# Patient Record
Sex: Female | Born: 1935 | Race: White | Hispanic: No | Smoking: Never smoker
Health system: Southern US, Community
[De-identification: ages and names within clinical notes are randomized; demographics above are authoritative.]

## PROBLEM LIST (undated history)

## (undated) DIAGNOSIS — N183 Chronic kidney disease, stage 3 (moderate): Secondary | ICD-10-CM

## (undated) DIAGNOSIS — E785 Hyperlipidemia, unspecified: Secondary | ICD-10-CM

## (undated) DIAGNOSIS — J309 Allergic rhinitis, unspecified: Secondary | ICD-10-CM

## (undated) DIAGNOSIS — M8580 Other specified disorders of bone density and structure, unspecified site: Secondary | ICD-10-CM

## (undated) DIAGNOSIS — K5790 Diverticulosis of intestine, part unspecified, without perforation or abscess without bleeding: Secondary | ICD-10-CM

## (undated) DIAGNOSIS — N189 Chronic kidney disease, unspecified: Secondary | ICD-10-CM

## (undated) DIAGNOSIS — C801 Malignant (primary) neoplasm, unspecified: Secondary | ICD-10-CM

## (undated) DIAGNOSIS — K449 Diaphragmatic hernia without obstruction or gangrene: Secondary | ICD-10-CM

## (undated) DIAGNOSIS — C189 Malignant neoplasm of colon, unspecified: Secondary | ICD-10-CM

## (undated) DIAGNOSIS — K5900 Constipation, unspecified: Secondary | ICD-10-CM

## (undated) DIAGNOSIS — R946 Abnormal results of thyroid function studies: Secondary | ICD-10-CM

## (undated) DIAGNOSIS — C439 Malignant melanoma of skin, unspecified: Secondary | ICD-10-CM

## (undated) DIAGNOSIS — I129 Hypertensive chronic kidney disease with stage 1 through stage 4 chronic kidney disease, or unspecified chronic kidney disease: Secondary | ICD-10-CM

## (undated) DIAGNOSIS — I447 Left bundle-branch block, unspecified: Secondary | ICD-10-CM

## (undated) DIAGNOSIS — K8020 Calculus of gallbladder without cholecystitis without obstruction: Secondary | ICD-10-CM

## (undated) DIAGNOSIS — J449 Chronic obstructive pulmonary disease, unspecified: Secondary | ICD-10-CM

## (undated) DIAGNOSIS — J45909 Unspecified asthma, uncomplicated: Secondary | ICD-10-CM

## (undated) HISTORY — DX: Malignant melanoma of skin, unspecified: C43.9

## (undated) HISTORY — PX: ABDOMINAL HYSTERECTOMY: SUR658

## (undated) HISTORY — DX: Malignant neoplasm of colon, unspecified: C18.9

## (undated) HISTORY — DX: Hypertensive chronic kidney disease with stage 1 through stage 4 chronic kidney disease, or unspecified chronic kidney disease: I12.9

## (undated) HISTORY — DX: Abnormal results of thyroid function studies: R94.6

## (undated) HISTORY — PX: OTHER SURGICAL HISTORY: SHX169

## (undated) HISTORY — DX: Hyperlipidemia, unspecified: E78.5

## (undated) HISTORY — DX: Chronic obstructive pulmonary disease, unspecified: J44.9

## (undated) HISTORY — DX: Constipation, unspecified: K59.00

## (undated) HISTORY — DX: Left bundle-branch block, unspecified: I44.7

## (undated) HISTORY — DX: Chronic kidney disease, stage 3 (moderate): N18.3

## (undated) HISTORY — PX: BREAST EXCISIONAL BIOPSY: SUR124

## (undated) HISTORY — PX: COLON SURGERY: SHX602

## (undated) HISTORY — DX: Diverticulosis of intestine, part unspecified, without perforation or abscess without bleeding: K57.90

## (undated) HISTORY — DX: Calculus of gallbladder without cholecystitis without obstruction: K80.20

## (undated) HISTORY — DX: Chronic kidney disease, unspecified: N18.9

## (undated) HISTORY — PX: HEMICOLECTOMY: SHX854

## (undated) HISTORY — DX: Allergic rhinitis, unspecified: J30.9

## (undated) HISTORY — DX: Unspecified asthma, uncomplicated: J45.909

## (undated) HISTORY — DX: Other specified disorders of bone density and structure, unspecified site: M85.80

## (undated) HISTORY — DX: Diaphragmatic hernia without obstruction or gangrene: K44.9

---

## 2004-03-02 HISTORY — PX: SKIN CANCER EXCISION: SHX779

## 2004-03-02 HISTORY — PX: COLON RESECTION: SHX5231

## 2008-09-11 ENCOUNTER — Ambulatory Visit: Payer: Self-pay | Admitting: Family Medicine

## 2009-01-22 ENCOUNTER — Ambulatory Visit: Payer: Self-pay | Admitting: Internal Medicine

## 2009-02-05 ENCOUNTER — Ambulatory Visit: Payer: Self-pay | Admitting: Internal Medicine

## 2010-02-13 ENCOUNTER — Ambulatory Visit: Payer: Self-pay | Admitting: Internal Medicine

## 2011-04-02 ENCOUNTER — Emergency Department: Payer: Self-pay | Admitting: *Deleted

## 2011-04-02 LAB — CBC
HCT: 37.2 % (ref 35.0–47.0)
HGB: 12.4 g/dL (ref 12.0–16.0)
MCH: 28.8 pg (ref 26.0–34.0)
MCHC: 33.3 g/dL (ref 32.0–36.0)
Platelet: 335 10*3/uL (ref 150–440)
RBC: 4.3 10*6/uL (ref 3.80–5.20)
WBC: 13.3 10*3/uL — ABNORMAL HIGH (ref 3.6–11.0)

## 2011-04-02 LAB — URINALYSIS, COMPLETE
Bilirubin,UR: NEGATIVE
Blood: NEGATIVE
Ketone: NEGATIVE
Nitrite: NEGATIVE
Ph: 5 (ref 4.5–8.0)
Protein: NEGATIVE
RBC,UR: 1 /HPF (ref 0–5)

## 2011-04-02 LAB — COMPREHENSIVE METABOLIC PANEL
Anion Gap: 12 (ref 7–16)
Bilirubin,Total: 0.5 mg/dL (ref 0.2–1.0)
Calcium, Total: 8.8 mg/dL (ref 8.5–10.1)
Chloride: 99 mmol/L (ref 98–107)
EGFR (African American): >60
EGFR (Non-African Amer.): 53 — ABNORMAL LOW
Glucose: 128 mg/dL — ABNORMAL HIGH (ref 65–99)
Osmolality: 280 (ref 275–301)
Potassium: 3.7 mmol/L (ref 3.5–5.1)
SGOT(AST): 30 U/L (ref 15–37)
Total Protein: 7.3 g/dL (ref 6.4–8.2)

## 2011-04-02 LAB — TROPONIN I: Troponin-I: <0.02 ng/mL

## 2011-04-22 ENCOUNTER — Ambulatory Visit: Payer: Self-pay | Admitting: Internal Medicine

## 2011-09-19 ENCOUNTER — Inpatient Hospital Stay: Payer: Self-pay | Admitting: Internal Medicine

## 2011-09-19 LAB — COMPREHENSIVE METABOLIC PANEL
Albumin: 3.7 g/dL (ref 3.4–5.0)
Anion Gap: 10 (ref 7–16)
BUN: 24 mg/dL — ABNORMAL HIGH (ref 7–18)
Bilirubin,Total: 0.5 mg/dL (ref 0.2–1.0)
Chloride: 102 mmol/L (ref 98–107)
Co2: 24 mmol/L (ref 21–32)
Creatinine: 1.13 mg/dL (ref 0.60–1.30)
EGFR (Non-African Amer.): 44 — ABNORMAL LOW
Osmolality: 278 (ref 275–301)
SGOT(AST): 31 U/L (ref 15–37)
Sodium: 136 mmol/L (ref 136–145)
Total Protein: 7.5 g/dL (ref 6.4–8.2)

## 2011-09-19 LAB — CBC
HCT: 41.9 % (ref 35.0–47.0)
HGB: 13.1 g/dL (ref 12.0–16.0)
MCV: 87 fL (ref 80–100)
Platelet: 313 10*3/uL (ref 150–440)
RBC: 4.83 10*6/uL (ref 3.80–5.20)
RDW: 15 % — ABNORMAL HIGH (ref 11.5–14.5)
WBC: 13.1 10*3/uL — ABNORMAL HIGH (ref 3.6–11.0)

## 2011-09-19 LAB — URINALYSIS, COMPLETE
Glucose,UR: NEGATIVE mg/dL (ref 0–75)
Protein: NEGATIVE
RBC,UR: 61 /HPF (ref 0–5)
Specific Gravity: 1.021 (ref 1.003–1.030)
Squamous Epithelial: NONE SEEN
WBC UR: 3 /HPF (ref 0–5)

## 2011-09-19 LAB — MAGNESIUM: Magnesium: 2 mg/dL

## 2011-09-19 LAB — LIPASE, BLOOD: Lipase: 116 U/L (ref 73–393)

## 2011-09-20 LAB — BASIC METABOLIC PANEL
BUN: 26 mg/dL — ABNORMAL HIGH (ref 7–18)
Calcium, Total: 7.7 mg/dL — ABNORMAL LOW (ref 8.5–10.1)
Chloride: 104 mmol/L (ref 98–107)
Creatinine: 0.98 mg/dL (ref 0.60–1.30)
EGFR (Non-African Amer.): 52 — ABNORMAL LOW
Potassium: 3.4 mmol/L — ABNORMAL LOW (ref 3.5–5.1)
Sodium: 140 mmol/L (ref 136–145)

## 2011-09-20 LAB — CBC WITH DIFFERENTIAL/PLATELET
Basophil %: 0.2 %
Eosinophil #: 0 10*3/uL (ref 0.0–0.7)
Eosinophil %: 0.2 %
Lymphocyte #: 1.1 10*3/uL (ref 1.0–3.6)
Lymphocyte %: 9 %
MCH: 28.4 pg (ref 26.0–34.0)
MCV: 86 fL (ref 80–100)
Monocyte #: 1 x10 3/mm — ABNORMAL HIGH (ref 0.2–0.9)
Neutrophil %: 82.8 %
Platelet: 285 10*3/uL (ref 150–440)
RBC: 4.02 10*6/uL (ref 3.80–5.20)

## 2011-09-21 LAB — CBC WITH DIFFERENTIAL/PLATELET
Basophil %: 0.7 %
Eosinophil #: 0.2 10*3/uL (ref 0.0–0.7)
Eosinophil %: 3.1 %
HCT: 34.1 % — ABNORMAL LOW (ref 35.0–47.0)
Lymphocyte %: 14.2 %
MCHC: 31.8 g/dL — ABNORMAL LOW (ref 32.0–36.0)
Monocyte %: 8.6 %
Neutrophil #: 5.7 10*3/uL (ref 1.4–6.5)
Neutrophil %: 73.4 %
RBC: 3.9 10*6/uL (ref 3.80–5.20)
RDW: 15 % — ABNORMAL HIGH (ref 11.5–14.5)
WBC: 7.8 10*3/uL (ref 3.6–11.0)

## 2011-09-21 LAB — COMPREHENSIVE METABOLIC PANEL
Albumin: 2.6 g/dL — ABNORMAL LOW (ref 3.4–5.0)
Alkaline Phosphatase: 57 U/L (ref 50–136)
Anion Gap: 8 (ref 7–16)
BUN: 21 mg/dL — ABNORMAL HIGH (ref 7–18)
Bilirubin,Total: 0.4 mg/dL (ref 0.2–1.0)
Calcium, Total: 7.2 mg/dL — ABNORMAL LOW (ref 8.5–10.1)
Creatinine: 1.14 mg/dL (ref 0.60–1.30)
Potassium: 3 mmol/L — ABNORMAL LOW (ref 3.5–5.1)
SGOT(AST): 22 U/L (ref 15–37)
Total Protein: 5.2 g/dL — ABNORMAL LOW (ref 6.4–8.2)

## 2011-09-21 LAB — URINE CULTURE

## 2011-09-22 LAB — CBC WITH DIFFERENTIAL/PLATELET
Basophil #: 0 10*3/uL (ref 0.0–0.1)
Basophil %: 0.6 %
Eosinophil #: 0.5 10*3/uL (ref 0.0–0.7)
HCT: 31.1 % — ABNORMAL LOW (ref 35.0–47.0)
HGB: 10.3 g/dL — ABNORMAL LOW (ref 12.0–16.0)
Lymphocyte #: 1.1 10*3/uL (ref 1.0–3.6)
MCH: 28.7 pg (ref 26.0–34.0)
MCHC: 32.9 g/dL (ref 32.0–36.0)
MCV: 87 fL (ref 80–100)
Monocyte #: 0.6 x10 3/mm (ref 0.2–0.9)
Neutrophil #: 4.3 10*3/uL (ref 1.4–6.5)
Platelet: 223 10*3/uL (ref 150–440)
RDW: 14.9 % — ABNORMAL HIGH (ref 11.5–14.5)

## 2011-09-22 LAB — BASIC METABOLIC PANEL
Anion Gap: 8 (ref 7–16)
Calcium, Total: 7.2 mg/dL — ABNORMAL LOW (ref 8.5–10.1)
Co2: 23 mmol/L (ref 21–32)
EGFR (African American): >60
EGFR (Non-African Amer.): 55 — ABNORMAL LOW
Glucose: 98 mg/dL (ref 65–99)
Osmolality: 285 (ref 275–301)
Potassium: 3.4 mmol/L — ABNORMAL LOW (ref 3.5–5.1)

## 2012-02-06 ENCOUNTER — Emergency Department: Payer: Self-pay | Admitting: Emergency Medicine

## 2012-04-25 ENCOUNTER — Ambulatory Visit: Payer: Self-pay | Admitting: Internal Medicine

## 2012-05-04 ENCOUNTER — Ambulatory Visit: Payer: Self-pay | Admitting: Internal Medicine

## 2013-05-02 ENCOUNTER — Ambulatory Visit: Payer: Self-pay | Admitting: Internal Medicine

## 2013-11-18 DIAGNOSIS — N183 Chronic kidney disease, stage 3 unspecified: Secondary | ICD-10-CM

## 2013-11-18 DIAGNOSIS — E785 Hyperlipidemia, unspecified: Secondary | ICD-10-CM

## 2013-11-18 DIAGNOSIS — J4489 Other specified chronic obstructive pulmonary disease: Secondary | ICD-10-CM

## 2013-11-18 DIAGNOSIS — J309 Allergic rhinitis, unspecified: Secondary | ICD-10-CM

## 2013-11-18 DIAGNOSIS — E039 Hypothyroidism, unspecified: Secondary | ICD-10-CM

## 2013-11-18 DIAGNOSIS — I447 Left bundle-branch block, unspecified: Secondary | ICD-10-CM | POA: Insufficient documentation

## 2013-11-18 DIAGNOSIS — K5900 Constipation, unspecified: Secondary | ICD-10-CM

## 2013-11-18 DIAGNOSIS — J449 Chronic obstructive pulmonary disease, unspecified: Secondary | ICD-10-CM

## 2013-11-18 DIAGNOSIS — R946 Abnormal results of thyroid function studies: Secondary | ICD-10-CM

## 2013-11-18 DIAGNOSIS — I129 Hypertensive chronic kidney disease with stage 1 through stage 4 chronic kidney disease, or unspecified chronic kidney disease: Secondary | ICD-10-CM

## 2013-11-18 HISTORY — DX: Other specified chronic obstructive pulmonary disease: J44.89

## 2013-11-18 HISTORY — DX: Hyperlipidemia, unspecified: E78.5

## 2013-11-18 HISTORY — DX: Allergic rhinitis, unspecified: J30.9

## 2013-11-18 HISTORY — DX: Hypertensive chronic kidney disease with stage 1 through stage 4 chronic kidney disease, or unspecified chronic kidney disease: I12.9

## 2013-11-18 HISTORY — DX: Constipation, unspecified: K59.00

## 2013-11-18 HISTORY — DX: Hypothyroidism, unspecified: E03.9

## 2013-11-18 HISTORY — DX: Chronic obstructive pulmonary disease, unspecified: J44.9

## 2013-11-18 HISTORY — DX: Left bundle-branch block, unspecified: I44.7

## 2013-11-18 HISTORY — DX: Chronic kidney disease, stage 3 unspecified: N18.30

## 2014-04-17 ENCOUNTER — Ambulatory Visit: Payer: Self-pay | Admitting: Orthopedic Surgery

## 2014-05-07 ENCOUNTER — Ambulatory Visit: Payer: Self-pay | Admitting: Internal Medicine

## 2014-06-19 NOTE — H&P (Signed)
PATIENT NAME:  Beth Chen, KUHLMANN MR#:  [id number] DATE OF BIRTH:  11/23/1921  DATE OF ADMISSION:  09/19/2011  PRIMARY CARE PHYSICIAN: Frazier Richards, MD  ED REFERRING PHYSICIAN:  Belva Bertin, MD   CHIEF COMPLAINT: Abdominal pain.   HISTORY OF PRESENT ILLNESS: The patient is an 81 year old Caucasian female with history of colon cancer, hypertension, hyperlipidemia, and history of severe colonic diverticulosis who states that she has occasional abdominal discomfort in the right lower abdomen. However, over the past few days she has had progressive pain in the right lower abdomen with associated nausea. She also threw up once. Due to her pain getting worse, she came to the ED. In the ED, she had a CT scan of the abdomen which showed distended loops of small bowel as well as there was a hernia noted on the lateral aspect of the abdomen. We were asked to admit the patient. She otherwise reports that she was eating well up until yesterday when her appetite started decreasing. She has also been feeling very nauseous and has threw up once. Her last bowel movement was in a small quantity yesterday. No bowel movement since then. She has not had any hematemesis, has not had any hematochezia. She otherwise denies any chest pain, no shortness of breath. She denies any urinary frequency, urgency, or hesitancy. The patient complains of abdominal pain in the right lower quadrant which she describes as sharp, constant type of pain.   PAST MEDICAL HISTORY:  1. History of melanoma.  2. History of hypercholesterolemia.  3. Hypertension.  4. Chronic bronchitis/chronic obstructive pulmonary disease.  5. Osteopenia.  6. History of cholelithiasis.  7. Right hiatal hernia.  8. Severe colonic diverticulosis.  9. History of colon cancer in a large polyp in 2008 with several polypectomies in 2009. 10. History of adenocarcinoma in October 2006 arising from ascending colon requiring right hemicolectomy. Negative lymph nodes. Did  not require chemotherapy or radiation therapy.   PAST SURGICAL HISTORY:  1. Status post bladder lift. 2. Status post hysterectomy.  3. Status post right hemicolectomy in 2006.   CURRENT MEDICATIONS:  1. Advair 250/50 one puff twice a day. 2. Aspirin 81 mg 1 tab p.o. daily.  3. Dulcolax 1 tab as needed.  4. Lisinopril/HCTZ 10/25 mg daily.  5. Prilosec 20 mg daily.  6. Simvastatin 40 mg at bedtime.  7. Tylenol extra strength 2 tabs as needed for pain.   ALLERGIES: No known drug allergies.   SOCIAL HISTORY: She does not smoke, does not drink. Currently resides at [facility].   FAMILY HISTORY: Mother had congestive heart failure, father with lung cancer.   REVIEW OF SYSTEMS: CONSTITUTIONAL: Denies any fevers. Complains of fatigue and weakness for the past two days. Complains of right lower quadrant abdominal pain. No weight loss. No weight gain. EYES: No blurred or double vision. No pain. No redness. No inflammation. ENT: No tinnitus. No ear pain. No difficulty swallowing. RESPIRATORY: Has chronic bronchitis. Denies any wheezing or hemoptysis. CARDIOVASCULAR: No chest pain. No orthopnea. No edema. No palpitations. No syncope. GASTROINTESTINAL: Complains of nausea and vomiting. No diarrhea. No hematemesis. No melena. No rectal bleeding. GENITOURINARY: Denies any dysuria, hematuria, or renal calculus. ENDOCRINE: Denies any polyuria, nocturia, or thyroid problems. HEME/LYMPH: Denies anemia, easy bruisability, or bleeding. SKIN: No acne. No rash. No changes in mole, hair or skin. MUSCULOSKELETAL: Denies any pain in the neck, back, or shoulders. NEURO: No numbness. No cerebrovascular accident. No transient ischemic attack. PSYCHIATRIC: No anxiety. No insomnia. No ADD.  PHYSICAL EXAMINATION:   VITAL SIGNS: Temperature 98.3, pulse 71, respirations 18, blood pressure 125/60, and O2 95%.   GENERAL: The patient is an 81 year old female in no acute distress.   HEENT: Head atraumatic,  normocephalic. Pupils equally round and reactive to light and accommodation. There is no conjunctival pallor. No scleral icterus. Nasal exam shows no drainage or ulceration. Oropharynx is clear without any exudates.   NECK: No thyromegaly. No carotid bruits.   CARDIOVASCULAR: Regular rate and rhythm. No murmurs, rubs, clicks, or gallops. PMI is displaced.   LUNGS: Clear to auscultation bilaterally without any rales, rhonchi, or wheezing.   ABDOMEN: There is some mild distention. She has a hernia in the right lower quadrant with loops of bowel felt in the hernia. She does not have any significant guarding. She does have some mild tenderness. No hepatosplenomegaly.   EXTREMITIES: No clubbing, cyanosis, or edema.   SKIN: No rash.   LYMPHATICS: No lymph nodes palpable.   VASCULAR: Good DP and PT pulses.   PSYCHIATRIC: Not anxious or depressed.   NEUROLOGICAL: Awake, alert, and oriented x3. No focal deficits.  LABORATORY AND RADIOLOGIC DATA: CT of the abdomen shows distended loops of small bowel are present along with nondistended loops of small bowel. There is a hernia along the lateral aspect of the abdomen just above the iliac crest. There is cholelithiasis and old healed rib fractures.   Urinalysis shows leukocyte esterase, WBC is 3, and 2+ bacteria.  BMP: Glucose is 139, BUN 24, creatinine 1.13, sodium 136, potassium 3.8, chloride 102, and CO2 24. LFTs were normal. WBC 13.1, hemoglobin 13.1, and platelet count 413. Lipase was 116.   ASSESSMENT AND PLAN: The patient is an 81 year old white female who presents with abdominal pain.  1. Abdominal pain likely related to possible ileus/small bowel obstruction related to abdominal hernia. At this point, we will keep the patient n.p.o. Control her pain. IV fluids. We will have surgery come and evaluate the patient in the morning. Hold off on NG tube for the time being.  2. Hypertension. Blood pressure is borderline normal. At this point, we  will hold her antihypertensives.  3. Hyperlipidemia. Hold her simvastatin for the time being.  4. Chronic obstructive pulmonary disease/chronic bronchitis. We will continue inhalers as taking at home.  5. Miscellaneous. We will place her on heparin for deep vein thrombosis prophylaxis.   TIME SPENT: 35 minutes.  ____________________________ Lafonda Mosses Posey Pronto, MD shp:slb D: 09/19/2011 23:00:36 ET T: 09/20/2011 09:48:03 ET JOB#: [id number]  cc: Johnnell Liou H. Posey Pronto, MD, <Dictator> Ocie Cornfield. Ouida Sills, MD Alric Seton MD ELECTRONICALLY SIGNED 09/21/2011 19:56

## 2014-06-19 NOTE — Discharge Summary (Signed)
PATIENT NAME:  Beth Chen, Beth Chen MR#:  [id number] DATE OF BIRTH:  1921-03-15  DATE OF ADMISSION:  09/19/2011 DATE OF DISCHARGE:  09/22/2011  DISCHARGE DIAGNOSES:  1. Diverticulitis. 2. Ileus.   DISCHARGE MEDICATIONS:  1. Cipro 500 b.i.d. for a week, new medication. 2. Flagyl 500 t.i.d. for a week, new medication. Home medications will all be continued:  3. Simvastatin 40 mg daily.  4. Aspirin 81 mg daily.  5. Tylenol p.r.n.  6. Advair 250/50, 1 puff b.i.d.  7. Prilosec 20 daily.  8. Hydrochlorothiazide/lisinopril 10/12.5 daily.   HISTORY AND PHYSICAL: Please see detailed History and Physical done on admission.   HOSPITAL COURSE: The patient was admitted with abdominal pain, likely diverticulitis. There was no small bowel obstruction per surgery consult and imaging studies. Also had a urinary tract infection of Escherichia coli that was pansensitive. CT of the abdomen and pelvis done showed distended loops of small bowel and nondistended loops of small bowel with a hernia in the lateral aspect of the abdomen. Also noted to have cholelithiasis. Surgery consulted and followed her closely.  They thought she became much less distended in her abdomen. With antibiotics her white count, which was elevated on admission, improved to 7.8. She had a mild anemia, which may have been secondary to her mild chronic kidney disease stage III that was being treated as well.  She was followed by surgery to consider ventral hernia repair. I will see her soon in the office as well.  She will tolerate her diet and ambulate prior to discharge.  ____________________________ Ocie Cornfield. Ouida Sills, MD mwa:bjt D:  09/22/2011 08:10:11 ET         T: 09/22/2011 12:56:45 ET         JOB#: [id number] Kirk Ruths MD ELECTRONICALLY SIGNED 09/22/2011 17:18

## 2014-06-19 NOTE — Consult Note (Signed)
PATIENT NAME:  Beth Chen, NULTY MR#:  [id number] DATE OF BIRTH:  May 04, 1921  DATE OF CONSULTATION:  09/20/2011  REFERRING PHYSICIAN:   CONSULTING PHYSICIAN:  Jerrol Banana. Burt Knack, MD  CHIEF COMPLAINT: Abdominal pain.   HISTORY OF PRESENT ILLNESS: This is a patient with epigastric pain that started two days ago and seemed to be worsening, but at this point in the hospitalization she seems to be much better. She states her pain is present but much improved. She has had some nausea today but no emesis today. She had vomited yesterday but no NG tube was placed in the emergency room. I was asked to see the patient for possible small bowel obstruction and ventral hernia. The patient states she has known about her ventral hernia for a long time and has been told in the past not to have it repaired. She has been passing gas today but has not had a bowel movement in several days.   She has never had an episode like this before, denies fevers or chills.   PAST MEDICAL HISTORY:  1. Colon cancer. 2. Hypertension.  3. Hypercholesterolemia.  4. Chronic bronchitis and chronic obstructive pulmonary disease.  5. Diverticulosis.   PAST SURGICAL HISTORY:  1. Right hemicolectomy performed in [place], [place]. 2. Hysterectomy and bladder suspension.   MEDICATIONS: Advair, aspirin, Dulcolax, lisinopril, Prilosec, and simvastatin.   ALLERGIES: No known drug allergies.   FAMILY HISTORY: Noncontributory.   SOCIAL HISTORY: She does not smoke or drink.  REVIEW OF SYSTEMS: Ten system review was performed and negative with the exception of that mentioned in the history of present illness.   FAMILY HISTORY: Noncontributory.   PHYSICAL EXAMINATION:   GENERAL: Healthy-appearing, comfortable-appearing Caucasian female patient.   VITAL SIGNS: Temperature 99.4, pulse 72, respirations 20, blood pressure 107/68, and 94% room air saturation.  HEENT: No scleral icterus.   NECK: No palpable neck nodes.   CHEST: Clear  to auscultation.   CARDIAC: Regular rate and rhythm.   ABDOMEN: Soft, nondistended, and nontender. There is a far right lateral hernia. She has a transverse incision and this hernia is very far lateral lateral to the rectus sheath and soft and easily reducible and nontender.   EXTREMITIES: No edema. Calves are nontender.   NEUROLOGIC: Grossly intact.   INTEGUMENT: No jaundice.  LABS/STUDIES: CT scan is personally reviewed. It was done without contrast. There is a large ventral hernia with small bowel present in the right lateral abdomen, some dilated bowel but no obvious sign of obstruction.   Laboratory values demonstrate a potassium of 3.4 and calcium 7.7. White blood cell count is 12.7, hemoglobin and hematocrit 11.4 and 35, and platelet count is 285.   ASSESSMENT AND PLAN: This is a patient with abdominal pain which is improving but not gone. She has not had a bowel movement in several days but is passing gas. She has not had any recent nausea or vomiting, certainly since admission, and no NG tube has been required. I see no sign of clear-cut bowel obstruction and the ventral hernia is easily reducible. With that in mind, I have recommended utilizing enemas to see if evacuation of some stool improves her condition. I will order repeat KUB in the morning and follow up with serial examinations. ____________________________ Jerrol Banana Burt Knack, MD rec:slb D: 09/20/2011 13:42:50 ET T: 09/20/2011 15:20:50 ET JOB#: [id number]  cc: Jerrol Banana. Burt Knack, MD, <Dictator> Florene Glen MD ELECTRONICALLY SIGNED 09/21/2011 11:37

## 2014-06-19 NOTE — Consult Note (Signed)
Brief Consult Note: Diagnosis: VH and SBO.   Patient was seen by consultant.   Consult note dictated.   Recommend further assessment or treatment.   Orders entered.   Comments: pt passing gas but no bm. will attempt enemas. VH is nontender and reducible. No clear evidence of SBO and no further emesis, no NG placed.  Electronic Signatures: Florene Glen (MD)  (Signed 21-Jul-13 11:14)  Authored: Brief Consult Note   Last Updated: 21-Jul-13 11:14 by Florene Glen (MD)

## 2015-07-01 ENCOUNTER — Other Ambulatory Visit: Payer: Self-pay | Admitting: Internal Medicine

## 2015-07-01 DIAGNOSIS — Z1231 Encounter for screening mammogram for malignant neoplasm of breast: Secondary | ICD-10-CM

## 2015-07-04 ENCOUNTER — Other Ambulatory Visit: Payer: Self-pay | Admitting: Internal Medicine

## 2015-07-04 ENCOUNTER — Ambulatory Visit
Admission: RE | Admit: 2015-07-04 | Discharge: 2015-07-04 | Disposition: A | Discharge status: Home / Self Care | Payer: Medicare Other | Source: Ambulatory Visit | Attending: Internal Medicine | Admitting: Internal Medicine

## 2015-07-04 DIAGNOSIS — Z1231 Encounter for screening mammogram for malignant neoplasm of breast: Secondary | ICD-10-CM | POA: Insufficient documentation

## 2015-10-28 ENCOUNTER — Encounter: Payer: Self-pay | Admitting: Emergency Medicine

## 2015-10-28 ENCOUNTER — Emergency Department
Admission: EM | Admit: 2015-10-28 | Discharge: 2015-10-28 | Disposition: A | Discharge status: Home / Self Care | Payer: Medicare Other | Attending: Emergency Medicine | Admitting: Emergency Medicine

## 2015-10-28 DIAGNOSIS — Y929 Unspecified place or not applicable: Secondary | ICD-10-CM | POA: Diagnosis not present

## 2015-10-28 DIAGNOSIS — Y999 Unspecified external cause status: Secondary | ICD-10-CM | POA: Insufficient documentation

## 2015-10-28 DIAGNOSIS — W19XXXA Unspecified fall, initial encounter: Secondary | ICD-10-CM

## 2015-10-28 DIAGNOSIS — W108XXA Fall (on) (from) other stairs and steps, initial encounter: Secondary | ICD-10-CM | POA: Insufficient documentation

## 2015-10-28 DIAGNOSIS — I10 Essential (primary) hypertension: Secondary | ICD-10-CM | POA: Diagnosis not present

## 2015-10-28 DIAGNOSIS — Y9301 Activity, walking, marching and hiking: Secondary | ICD-10-CM | POA: Insufficient documentation

## 2015-10-28 DIAGNOSIS — Z043 Encounter for examination and observation following other accident: Secondary | ICD-10-CM | POA: Diagnosis present

## 2015-10-28 DIAGNOSIS — S50311A Abrasion of right elbow, initial encounter: Secondary | ICD-10-CM | POA: Insufficient documentation

## 2015-10-28 NOTE — ED Notes (Signed)
Pt. Daughter at the bedside, Schmied.

## 2015-10-28 NOTE — ED Notes (Signed)
Pt walked in room with 2 person assist for balance. No c/o pain. Seems to be at or close to baseline. Pt usually uses walker to ambulate.

## 2015-10-28 NOTE — ED Triage Notes (Signed)
Pt states she fell today while going down a ramp with her walker and fell. No loc at time of fall.

## 2015-10-28 NOTE — ED Provider Notes (Signed)
[facility] Emergency Department Provider Note  Time seen: 6:53 PM  I have reviewed the triage vital signs and the nursing notes.   HISTORY  Chief Complaint Fall    HPI Beth Chen is a 82 y.o. female presents to the emergency department after a fall. According to the patient she was walking down steps and she thought she was at the bottom of the steps but she actually had one step to go. States she fell forward landing on her knees. Denies hitting her head, LOC or vomiting. Patient states she called EMS who is able to help her get up. Patient states she has been able to walk since the fall, but came in for evaluation because she states she felt weak in the knees. Patient states a mild abrasion to the right elbow, otherwise denies any injuries area denies any pain at the current time.  Past Medical History:  Diagnosis Date  . Hypertension     There are no active problems to display for this patient.   Past Surgical History:  Procedure Laterality Date  . BREAST EXCISIONAL BIOPSY Left 15+ yrs ago   neg    Prior to Admission medications   Not on File    No Known Allergies  Family History  Problem Relation Age of Onset  . Breast cancer Daughter 30    passed at 31 from breast cancer    Social History Social History  Substance Use Topics  . Smoking status: Never Smoker  . Smokeless tobacco: Never Used  . Alcohol use No    Review of Systems Constitutional: Negative for fever. Cardiovascular: Negative for chest pain. Respiratory: Negative for shortness of breath. Gastrointestinal: Negative for abdominal pain Musculoskeletal: Weak feeling in the knees bilaterally. Neurological: Negative for headache 10-point ROS otherwise negative.  ____________________________________________   PHYSICAL EXAM:  VITAL SIGNS: ED Triage Vitals [10/28/15 1648]  Enc Vitals Group     BP (!) 145/72     Pulse Rate 72     Resp 20     Temp 98.9 F (37.2 C)    Temp Source Oral     SpO2 99 %     Weight 148 lb (67.1 kg)     Height 5' (1.524 m)     Head Circumference      Peak Flow      Pain Score      Pain Loc      Pain Edu?      Excl. in [place]?     Constitutional: Alert and oriented. Well appearing and in no distress. Eyes: Normal exam ENT   Head: Normocephalic and atraumatic.   Mouth/Throat: Mucous membranes are moist. Cardiovascular: Normal rate, regular rhythm. No murmur Respiratory: Normal respiratory effort without tachypnea nor retractions. Breath sounds are clear  Gastrointestinal: Soft and nontender. No distention.  Musculoskeletal: Nontender with normal range of motion in all extremities. Very small abrasion to right elbow. Neurologic:  Normal speech and language. No gross focal neurologic deficits Skin:  Skin is warm, dry. Psychiatric: Mood and affect are normal. Speech and behavior are normal.   ____________________________________________   INITIAL IMPRESSION / ASSESSMENT AND PLAN / ED COURSE  Pertinent labs & imaging results that were available during my care of the patient were reviewed by me and considered in my medical decision making (see chart for details).  Patient presents to the emergency department after a fall. Overall patient appears well, nontender exam besides a very small abrasion to the right elbow. Denies  hitting her head, does not take blood thinners. Has no C-spine or back tenderness. Good range of motion in all extremities. We were able to ambulate the patient without difficulty. States she feels much better. Denies any pain with ambulation. We will discharge the patient home with PCP follow-up, Tylenol as needed for discomfort. Patient agreeable plan.  ____________________________________________   FINAL CLINICAL IMPRESSION(S) / ED DIAGNOSES  Cheral Marker, MD 10/28/15 1901

## 2016-04-21 ENCOUNTER — Ambulatory Visit (INDEPENDENT_AMBULATORY_CARE_PROVIDER_SITE_OTHER): Payer: Medicare Other | Admitting: Urology

## 2016-04-21 ENCOUNTER — Encounter: Payer: Self-pay | Admitting: Urology

## 2016-04-21 VITALS — BP 125/72 | HR 78 | Ht 61.0 in | Wt 149.0 lb

## 2016-04-21 DIAGNOSIS — N3000 Acute cystitis without hematuria: Secondary | ICD-10-CM

## 2016-04-21 DIAGNOSIS — R3911 Hesitancy of micturition: Secondary | ICD-10-CM

## 2016-04-21 LAB — BLADDER SCAN AMB NON-IMAGING

## 2016-04-21 LAB — MICROSCOPIC EXAMINATION
BACTERIA UA: NONE SEEN
WBC, UA: >30 /hpf — AB (ref 0–?)

## 2016-04-21 LAB — URINALYSIS, COMPLETE
Bilirubin, UA: NEGATIVE
GLUCOSE, UA: NEGATIVE
KETONES UA: NEGATIVE
NITRITE UA: NEGATIVE
Protein, UA: NEGATIVE
RBC, UA: NEGATIVE
Specific Gravity, UA: 1.015 (ref 1.005–1.030)
UUROB: 0.2 mg/dL (ref 0.2–1.0)
pH, UA: 6.5 (ref 5.0–7.5)

## 2016-04-21 NOTE — Progress Notes (Signed)
04/21/2016 10:48 AM   Beth Chen 1921/12/14 PM:[id number]  Referring provider: Kirk Ruths, MD [place] [facility] [place], [place] [address]  Chief Complaint  Patient presents with  . New Patient (Initial Visit)    urinary hesitancy    HPI: I was consulted to assess the patient's probable urinary tract infection. She was voiding small amounts with some difficulty urinating. It appears she was called by her primary care physician and treated as a urinary tract infection. The symptoms normalized.  Her flow was reasonable. She voids every few hours. She gets up twice at night. She had a bladder or prolapse operation years ago. She does not get a lot of bladder infections  He has no neurologic issues  Modifying factors: There are no other modifying factors  Associated signs and symptoms: There are no other associated signs and symptoms Aggravating and relieving factors: There are no other aggravating or relieving factors Severity: Moderate Duration: Persistent     PMH: Past Medical History:  Diagnosis Date  . Allergic rhinitis 11/18/2013  . Asthma with COPD (chronic obstructive pulmonary disease) ([place]) 11/18/2013   Last Assessment & Plan:  No flair ups noted recently and breathing is essentially at baseline.    . Borderline hypothyroidism 11/18/2013   Last Assessment & Plan:  Energy and tsh are doing well  . Complete left bundle branch block (LBBB) 11/18/2013  . Constipation 11/18/2013  . HLD (hyperlipidemia) 11/18/2013   Last Assessment & Plan:  Appropriate diet is followed and no myalgia's are noted.    . Hypertension   . Hypertensive kidney disease with CKD stage III 11/18/2013   Last Assessment & Plan:  Is compliant with hypertensive medications without clear side effects or lack of control.  Nausea and itching are not symptomatic and nsaids are being avoided.      Surgical History: Past Surgical History:  Procedure Laterality Date  . ABDOMINAL  HYSTERECTOMY    . Bladder Lift    . BREAST EXCISIONAL BIOPSY Left 15+ yrs ago   neg  . COLON RESECTION  2006   colon cancer  . SKIN CANCER EXCISION  2006   melanoma    Home Medications:  Allergies as of 04/21/2016   No Known Allergies     Medication List       Accurate as of 04/21/16 10:48 AM. Always use your most recent med list.          ADVAIR DISKUS 250-50 MCG/DOSE Aepb Generic drug:  Fluticasone-Salmeterol Inhale into the lungs.   aspirin EC 81 MG tablet Take by mouth.   fluticasone 50 MCG/ACT nasal spray Commonly known as:  FLONASE USE 2 SPRAYS IN EACH       NOSTRIL DAILY   losartan-hydrochlorothiazide 100-12.5 MG tablet Commonly known as:  HYZAAR TAKE 1 TABLET DAILY   polyethylene glycol powder powder Commonly known as:  GLYCOLAX/MIRALAX Take by mouth.   simvastatin 40 MG tablet Commonly known as:  ZOCOR TAKE 1 TABLET NIGHTLY FOR  CHOLESTEROL   VITAMIN D-1000 MAX ST 1000 units tablet Generic drug:  Cholecalciferol Take by mouth.       Allergies: No Known Allergies  Family History: Family History  Problem Relation Age of Onset  . Breast cancer Daughter 57    passed at 24 from breast cancer    Social History:  reports that she has never smoked. She has never used smokeless tobacco. She reports that she does not drink alcohol. Her drug history  is not on file.  ROS: UROLOGY Frequent Urination?: No Hard to postpone urination?: No Burning/pain with urination?: No Get up at night to urinate?: Yes Leakage of urine?: Yes Urine stream starts and stops?: No Trouble starting stream?: No Do you have to strain to urinate?: No Blood in urine?: No Urinary tract infection?: Yes Sexually transmitted disease?: No Injury to kidneys or bladder?: No Painful intercourse?: No Weak stream?: No Currently pregnant?: No Vaginal bleeding?: No Last menstrual period?: n  Gastrointestinal Nausea?: No Vomiting?: No Indigestion/heartburn?: No Diarrhea?:  No Constipation?: Yes  Constitutional Fever: No Night sweats?: No Weight loss?: No Fatigue?: No  Skin Skin rash/lesions?: No Itching?: No  Eyes Blurred vision?: No Double vision?: No  Ears/Nose/Throat Sore throat?: No Sinus problems?: No  Hematologic/Lymphatic Swollen glands?: No Easy bruising?: Yes  Cardiovascular Leg swelling?: No Chest pain?: No  Respiratory Cough?: No Shortness of breath?: No  Endocrine Excessive thirst?: No  Musculoskeletal Back pain?: No Joint pain?: No  Neurological Headaches?: No Dizziness?: No  Psychologic Depression?: No Anxiety?: No  Physical Exam: BP 125/72   Pulse 78   Ht 5\' 1"  (1.549 m)   Wt 67.6 kg (149 lb)   BMI 28.15 kg/m   Constitutional:  Alert and oriented, No acute distress. HEENT: [place] AT, moist mucus membranes.  Trachea midline, no masses. Cardiovascular: No clubbing, cyanosis, or edema. Respiratory: Normal respiratory effort, no increased work of breathing. GI: Abdomen is soft, nontender, nondistended, no abdominal masses GU: No CVA tenderness. No bladder tenderness Skin: No rashes, bruises or suspicious lesions. Lymph: No cervical or inguinal adenopathy. Neurologic: Grossly intact, no focal deficits, moving all 4 extremities. Psychiatric: Normal mood and affect.  Laboratory Data: Lab Results  Component Value Date   WBC 6.5 09/22/2011   HGB 10.3 (L) 09/22/2011   HCT 31.1 (L) 09/22/2011   MCV 87 09/22/2011   PLT 223 09/22/2011    Lab Results  Component Value Date   CREATININE 0.93 09/22/2011    No results found for: PSA  No results found for: TESTOSTERONE  No results found for: HGBA1C  Urinalysis    Component Value Date/Time   COLORURINE Yellow 09/19/2011 1607   APPEARANCEUR Turbid 09/19/2011 1607   LABSPEC 1.021 09/19/2011 1607   PHURINE 5.0 09/19/2011 1607   GLUCOSEU Negative 09/19/2011 1607   HGBUR 2+ 09/19/2011 1607   BILIRUBINUR Negative 09/19/2011 1607   KETONESUR 1+ 09/19/2011  1607   PROTEINUR Negative 09/19/2011 1607   NITRITE Negative 09/19/2011 1607   LEUKOCYTESUR Trace 09/19/2011 1607    Pertinent Imaging: None;l she is emptying efficiently with a low residual.  Assessment & Plan:  I history the patient had a bladder infection and otherwise has minimal voiding dysfunction a mild mitotane frequency. I will send her urine for culture today and call if it is positive. I did find in her primary care notes that she did have a positive culture. I will see her when necessary  1. Urinary hesitancy 2. Urinary tract infection - Urinalysis, Complete - BLADDER SCAN AMB NON-IMAGING   Return if symptoms worsen or fail to improve.  Reece Packer, MD  [facility] [address], [place], [place] [address] [phone]

## 2016-06-17 ENCOUNTER — Other Ambulatory Visit: Payer: Self-pay | Admitting: Internal Medicine

## 2016-06-17 DIAGNOSIS — Z1231 Encounter for screening mammogram for malignant neoplasm of breast: Secondary | ICD-10-CM

## 2016-07-06 ENCOUNTER — Ambulatory Visit
Admission: RE | Admit: 2016-07-06 | Discharge: 2016-07-06 | Disposition: A | Discharge status: Home / Self Care | Payer: Medicare Other | Source: Ambulatory Visit | Attending: Internal Medicine | Admitting: Internal Medicine

## 2016-07-06 DIAGNOSIS — Z1231 Encounter for screening mammogram for malignant neoplasm of breast: Secondary | ICD-10-CM | POA: Insufficient documentation

## 2016-09-05 ENCOUNTER — Inpatient Hospital Stay
Admission: EM | Admit: 2016-09-05 | Discharge: 2016-09-08 | DRG: 378 | Disposition: A | Discharge status: Home / Self Care | Payer: Medicare Other | Attending: Internal Medicine | Admitting: Internal Medicine

## 2016-09-05 ENCOUNTER — Encounter: Payer: Self-pay | Admitting: Emergency Medicine

## 2016-09-05 DIAGNOSIS — Z85038 Personal history of other malignant neoplasm of large intestine: Secondary | ICD-10-CM

## 2016-09-05 DIAGNOSIS — Z803 Family history of malignant neoplasm of breast: Secondary | ICD-10-CM

## 2016-09-05 DIAGNOSIS — K439 Ventral hernia without obstruction or gangrene: Secondary | ICD-10-CM | POA: Diagnosis present

## 2016-09-05 DIAGNOSIS — K922 Gastrointestinal hemorrhage, unspecified: Secondary | ICD-10-CM

## 2016-09-05 DIAGNOSIS — J449 Chronic obstructive pulmonary disease, unspecified: Secondary | ICD-10-CM | POA: Diagnosis present

## 2016-09-05 DIAGNOSIS — Z7951 Long term (current) use of inhaled steroids: Secondary | ICD-10-CM

## 2016-09-05 DIAGNOSIS — K5731 Diverticulosis of large intestine without perforation or abscess with bleeding: Secondary | ICD-10-CM | POA: Diagnosis not present

## 2016-09-05 DIAGNOSIS — D62 Acute posthemorrhagic anemia: Secondary | ICD-10-CM | POA: Diagnosis present

## 2016-09-05 DIAGNOSIS — Z85820 Personal history of malignant melanoma of skin: Secondary | ICD-10-CM

## 2016-09-05 DIAGNOSIS — Z66 Do not resuscitate: Secondary | ICD-10-CM | POA: Diagnosis present

## 2016-09-05 DIAGNOSIS — K5900 Constipation, unspecified: Secondary | ICD-10-CM | POA: Diagnosis present

## 2016-09-05 DIAGNOSIS — I129 Hypertensive chronic kidney disease with stage 1 through stage 4 chronic kidney disease, or unspecified chronic kidney disease: Secondary | ICD-10-CM | POA: Diagnosis present

## 2016-09-05 DIAGNOSIS — Z90710 Acquired absence of both cervix and uterus: Secondary | ICD-10-CM

## 2016-09-05 DIAGNOSIS — N183 Chronic kidney disease, stage 3 (moderate): Secondary | ICD-10-CM | POA: Diagnosis present

## 2016-09-05 DIAGNOSIS — Z79899 Other long term (current) drug therapy: Secondary | ICD-10-CM

## 2016-09-05 DIAGNOSIS — Z7982 Long term (current) use of aspirin: Secondary | ICD-10-CM

## 2016-09-05 DIAGNOSIS — E785 Hyperlipidemia, unspecified: Secondary | ICD-10-CM | POA: Diagnosis present

## 2016-09-05 DIAGNOSIS — Z9049 Acquired absence of other specified parts of digestive tract: Secondary | ICD-10-CM

## 2016-09-05 HISTORY — DX: Malignant (primary) neoplasm, unspecified: C80.1

## 2016-09-05 LAB — COMPREHENSIVE METABOLIC PANEL
ALK PHOS: 63 U/L (ref 38–126)
ALT: 18 U/L (ref 14–54)
AST: 28 U/L (ref 15–41)
Albumin: 3.9 g/dL (ref 3.5–5.0)
Anion gap: 7 (ref 5–15)
BUN: 31 mg/dL — ABNORMAL HIGH (ref 6–20)
CHLORIDE: 101 mmol/L (ref 101–111)
CO2: 27 mmol/L (ref 22–32)
Calcium: 8.9 mg/dL (ref 8.9–10.3)
Creatinine, Ser: 1.12 mg/dL — ABNORMAL HIGH (ref 0.44–1.00)
GFR calc Af Amer: 48 mL/min — ABNORMAL LOW (ref >60)
GFR, EST NON AFRICAN AMERICAN: 41 mL/min — AB (ref >60)
Glucose, Bld: 108 mg/dL — ABNORMAL HIGH (ref 65–99)
Potassium: 3.8 mmol/L (ref 3.5–5.1)
Sodium: 135 mmol/L (ref 135–145)
Total Bilirubin: 0.5 mg/dL (ref 0.3–1.2)
Total Protein: 6.7 g/dL (ref 6.5–8.1)

## 2016-09-05 LAB — CBC WITH DIFFERENTIAL/PLATELET
BASOS ABS: 0.1 10*3/uL (ref 0–0.1)
Basophils Relative: 1 %
Eosinophils Absolute: 0.2 10*3/uL (ref 0–0.7)
Eosinophils Relative: 2 %
HEMATOCRIT: 35 % (ref 35.0–47.0)
HEMOGLOBIN: 11.5 g/dL — AB (ref 12.0–16.0)
LYMPHS PCT: 10 %
Lymphs Abs: 1.3 10*3/uL (ref 1.0–3.6)
MCH: 28.4 pg (ref 26.0–34.0)
MCHC: 33 g/dL (ref 32.0–36.0)
MCV: 86.3 fL (ref 80.0–100.0)
Monocytes Absolute: 0.7 10*3/uL (ref 0.2–0.9)
Monocytes Relative: 5 %
NEUTROS ABS: 10.6 10*3/uL — AB (ref 1.4–6.5)
NEUTROS PCT: 82 %
PLATELETS: 351 10*3/uL (ref 150–440)
RBC: 4.06 MIL/uL (ref 3.80–5.20)
RDW: 14.8 % — ABNORMAL HIGH (ref 11.5–14.5)
WBC: 12.9 10*3/uL — AB (ref 3.6–11.0)

## 2016-09-05 MED ORDER — SIMVASTATIN 40 MG PO TABS
40.0000 mg | ORAL_TABLET | Freq: Every day | ORAL | Status: DC
  Administered 2016-09-06 – 2016-09-07 (×2): 40 mg via ORAL
  Filled 2016-09-05 (×3): qty 1

## 2016-09-05 MED ORDER — FLUTICASONE PROPIONATE 50 MCG/ACT NA SUSP
2.0000 | Freq: Every day | NASAL | Status: DC
  Administered 2016-09-05 – 2016-09-08 (×3): 2 via NASAL
  Filled 2016-09-05: qty 16

## 2016-09-05 MED ORDER — ACETAMINOPHEN 500 MG PO TABS
500.0000 mg | ORAL_TABLET | Freq: Four times a day (QID) | ORAL | Status: DC | PRN
  Administered 2016-09-06: 500 mg via ORAL
  Filled 2016-09-05: qty 1

## 2016-09-05 MED ORDER — PANTOPRAZOLE SODIUM 40 MG IV SOLR
40.0000 mg | Freq: Two times a day (BID) | INTRAVENOUS | Status: DC
  Administered 2016-09-05 – 2016-09-08 (×6): 40 mg via INTRAVENOUS
  Filled 2016-09-05 (×6): qty 40

## 2016-09-05 MED ORDER — DOCUSATE SODIUM 100 MG PO CAPS: 100.0000 mg | ORAL_CAPSULE | Freq: Two times a day (BID) | ORAL | Status: DC | PRN

## 2016-09-05 MED ORDER — MOMETASONE FURO-FORMOTEROL FUM 200-5 MCG/ACT IN AERO
2.0000 | INHALATION_SPRAY | Freq: Two times a day (BID) | RESPIRATORY_TRACT | Status: DC
  Administered 2016-09-05 – 2016-09-08 (×6): 2 via RESPIRATORY_TRACT
  Filled 2016-09-05: qty 8.8

## 2016-09-05 MED ORDER — SODIUM CHLORIDE 0.9 % IV SOLN
INTRAVENOUS | Status: AC
  Administered 2016-09-05 – 2016-09-06 (×2): via INTRAVENOUS

## 2016-09-05 NOTE — Progress Notes (Signed)
Family Meeting Note  Advance Directive:yes  Today a meeting took place with the Patient and her daughter.  The following clinical team members were present during this meeting:MD  The following were discussed:Patient's diagnosis: gi bleed, Hx of colon cancer, Patient's progosis: Unable to determine and Goals for treatment: DNR  Additional follow-up to be provided: GI consult.  Time spent during discussion:20 minutes  Erich Kochan, Rosalio Macadamia, MD

## 2016-09-05 NOTE — H&P (Signed)
[facility] NAME: Beth Chen    MR#:  [id number]  DATE OF BIRTH:  02-13-1922  DATE OF ADMISSION:  09/05/2016  PRIMARY CARE PHYSICIAN: Kirk Ruths, MD   REQUESTING/REFERRING PHYSICIAN: [email or web address]  CHIEF COMPLAINT:   Chief Complaint  Patient presents with  . Blood In Stools    HISTORY OF PRESENT ILLNESS: Beth Chen  is a 82 y.o. female with a known history of Asthma, HLD, Htn, Colon cancer with resection in 2006- noted red blood in her stool today, twice at home. Denies any associated abdominal pain, vomiting/nausea. Denies similar symptom in past. In ER on guiac- had frank red blood in exam. Hb stable. Denies OTC pain meds use.  PAST MEDICAL HISTORY:   Past Medical History:  Diagnosis Date  . Allergic rhinitis 11/18/2013  . Asthma with COPD (chronic obstructive pulmonary disease) ([place]) 11/18/2013   Last Assessment & Plan:  No flair ups noted recently and breathing is essentially at baseline.    . Borderline hypothyroidism 11/18/2013   Last Assessment & Plan:  Energy and tsh are doing well  . Cancer [facility])    colon cancer- surgically removed in 2006  . Complete left bundle branch block (LBBB) 11/18/2013  . Constipation 11/18/2013  . HLD (hyperlipidemia) 11/18/2013   Last Assessment & Plan:  Appropriate diet is followed and no myalgia's are noted.    . Hypertension   . Hypertensive kidney disease with CKD stage III 11/18/2013   Last Assessment & Plan:  Is compliant with hypertensive medications without clear side effects or lack of control.  Nausea and itching are not symptomatic and nsaids are being avoided.      PAST SURGICAL HISTORY: Past Surgical History:  Procedure Laterality Date  . ABDOMINAL HYSTERECTOMY    . Bladder Lift    . BREAST EXCISIONAL BIOPSY Left 15+ yrs ago   neg  . COLON RESECTION  2006   colon cancer  . SKIN CANCER EXCISION  2006   melanoma    SOCIAL HISTORY:  Social History  Substance Use  Topics  . Smoking status: Never Smoker  . Smokeless tobacco: Never Used  . Alcohol use No    FAMILY HISTORY:  Family History  Problem Relation Age of Onset  . Breast cancer Daughter 51       passed at 32 from breast cancer    DRUG ALLERGIES: No Known Allergies  REVIEW OF SYSTEMS:   CONSTITUTIONAL: No fever, fatigue or weakness.  EYES: No blurred or double vision.  EARS, NOSE, AND THROAT: No tinnitus or ear pain.  RESPIRATORY: No cough, shortness of breath, wheezing or hemoptysis.  CARDIOVASCULAR: No chest pain, orthopnea, edema.  GASTROINTESTINAL: No nausea, vomiting, diarrhea or abdominal pain. Have blood in stool. GENITOURINARY: No dysuria, hematuria.  ENDOCRINE: No polyuria, nocturia,  HEMATOLOGY: No anemia, easy bruising or bleeding SKIN: No rash or lesion. MUSCULOSKELETAL: No joint pain or arthritis.   NEUROLOGIC: No tingling, numbness, weakness.  PSYCHIATRY: No anxiety or depression.   MEDICATIONS AT HOME:  Prior to Admission medications   Medication Sig Start Date End Date Taking? Authorizing Provider  acetaminophen (TYLENOL) 500 MG tablet Take 500 mg by mouth every 6 (six) hours as needed.   Yes [provider]  aspirin EC 81 MG tablet Take by mouth. 08/08/14  Yes [provider]  bisacodyl (DULCOLAX) 5 MG EC tablet Take 5 mg by mouth daily as needed for moderate constipation.   Yes [provider]  Cholecalciferol (VITAMIN D-1000 MAX ST) 1000 units tablet Take 1,000 Units by mouth daily.  12/10/15  Yes [provider]  fluticasone (FLONASE) 50 MCG/ACT nasal spray USE 2 SPRAYS IN EACH       NOSTRIL DAILY 11/19/15  Yes [provider]  Fluticasone-Salmeterol (ADVAIR DISKUS) 250-50 MCG/DOSE AEPB Inhale 1 puff into the lungs 2 (two) times daily.  09/26/15  Yes [provider]  losartan-hydrochlorothiazide (HYZAAR) 100-12.5 MG tablet TAKE 1 TABLET DAILY 03/27/16  Yes [provider]  polyethylene glycol powder  (GLYCOLAX/MIRALAX) powder Take 17 g by mouth every evening.  02/13/16  Yes [provider]  simvastatin (ZOCOR) 40 MG tablet TAKE 1 TABLET NIGHTLY FOR  CHOLESTEROL 07/19/15  Yes [provider]      PHYSICAL EXAMINATION:   VITAL SIGNS: Blood pressure 116/72, pulse 89, temperature 98 F (36.7 C), temperature source Oral, resp. rate 16, height 5' (1.524 m), weight 65.8 kg (145 lb), SpO2 99 %.  GENERAL:  83 y.o.-year-old patient lying in the bed with no acute distress.  EYES: Pupils equal, round, reactive to light and accommodation. No scleral icterus. Extraocular muscles intact.  HEENT: Head atraumatic, normocephalic. Oropharynx and nasopharynx clear.  NECK:  Supple, no jugular venous distention. No thyroid enlargement, no tenderness.  LUNGS: Normal breath sounds bilaterally, no wheezing, rales,rhonchi or crepitation. No use of accessory muscles of respiration.  CARDIOVASCULAR: S1, S2 normal. No murmurs, rubs, or gallops.  ABDOMEN: Soft, nontender, nondistended. Bowel sounds present. No organomegaly or mass.  EXTREMITIES: No pedal edema, cyanosis, or clubbing.  NEUROLOGIC: Cranial nerves II through XII are intact. Muscle strength 5/5 in all extremities. Sensation intact. Gait not checked.  PSYCHIATRIC: The patient is alert and oriented x 3.  SKIN: No obvious rash, lesion, or ulcer.   LABORATORY PANEL:   CBC  Recent Labs Lab 09/05/16 1814  WBC 12.9*  HGB 11.5*  HCT 35.0  PLT 351  MCV 86.3  MCH 28.4  MCHC 33.0  RDW 14.8*  LYMPHSABS 1.3  MONOABS 0.7  EOSABS 0.2  BASOSABS 0.1   ------------------------------------------------------------------------------------------------------------------  Chemistries   Recent Labs Lab 09/05/16 1814  NA 135  K 3.8  CL 101  CO2 27  GLUCOSE 108*  BUN 31*  CREATININE 1.12*  CALCIUM 8.9  AST 28  ALT 18  ALKPHOS 63  BILITOT 0.5    ------------------------------------------------------------------------------------------------------------------ estimated creatinine clearance is 27.1 mL/min (A) (by C-G formula based on SCr of 1.12 mg/dL (H)). ------------------------------------------------------------------------------------------------------------------ No results for input(s): TSH, T4TOTAL, T3FREE, THYROIDAB in the last 72 hours.  Invalid input(s): FREET3   Coagulation profile No results for input(s): INR, PROTIME in the last 168 hours. ------------------------------------------------------------------------------------------------------------------- No results for input(s): DDIMER in the last 72 hours. -------------------------------------------------------------------------------------------------------------------  Cardiac Enzymes No results for input(s): CKMB, TROPONINI, MYOGLOBIN in the last 168 hours.  Invalid input(s): CK ------------------------------------------------------------------------------------------------------------------ Invalid input(s): POCBNP  ---------------------------------------------------------------------------------------------------------------  Urinalysis    Component Value Date/Time   COLORURINE Yellow 09/19/2011 1607   APPEARANCEUR Cloudy (A) 04/21/2016 1010   LABSPEC 1.021 09/19/2011 1607   PHURINE 5.0 09/19/2011 1607   GLUCOSEU Negative 04/21/2016 1010   GLUCOSEU Negative 09/19/2011 1607   HGBUR 2+ 09/19/2011 1607   BILIRUBINUR Negative 04/21/2016 1010   BILIRUBINUR Negative 09/19/2011 1607   KETONESUR 1+ 09/19/2011 1607   PROTEINUR Negative 04/21/2016 1010   PROTEINUR Negative 09/19/2011 1607   NITRITE Negative 04/21/2016 1010   NITRITE Negative 09/19/2011 1607   LEUKOCYTESUR 2+ (A) 04/21/2016 1010   LEUKOCYTESUR Trace 09/19/2011 1607  RADIOLOGY: No results found.  EKG: Orders placed or performed in visit on 09/19/11  . EKG 12-Lead     IMPRESSION AND PLAN:  * Gi bleed- Likely Lower GI   Hb stable.   Monitor.   Clear liquid diet, GI consult.  * Htn   Hold oral meds now due to GI bleed.  * Asthma   No exacerbation symptoms, cont inhalers.  * Hyperlipidemia   COnt statin  All the records are reviewed and case discussed with ED provider. Management plans discussed with the patient, family and they are in agreement.  CODE STATUS:DNR Code Status History    This patient does not have a recorded code status. Please follow your organizational policy for patients in this situation.       TOTAL TIME TAKING CARE OF THIS PATIENT: 45 minutes.    Vaughan Basta M.D on 09/05/2016   Between 7am to 6pm - Pager - [phone]  After 6pm go to [email or web address] - password [email or web address] [facility]  Office  [phone]  CC: Primary care physician; Kirk Ruths, MD   Note: This dictation was prepared with Dragon dictation along with smaller phrase technology. Any transcriptional errors that result from this process are unintentional.

## 2016-09-05 NOTE — ED Provider Notes (Signed)
[facility] Emergency Department Provider Note  Time seen: 6:02 PM  I have reviewed the triage vital signs and the nursing notes.   HISTORY  Chief Complaint Blood In Stools    HPI Beth Chen is a 83 y.o. female with a past medical history of hypertension, hyperlipidemia, presents to the emergency department with rectal bleeding. According to the patient 12 years ago she was diagnosed with colon cancer had a colon resection and no further workup since. She states earlier today she had 2 large bowel movements that were darker in color with some bright red blood. Denies any abdominal pain, nausea or vomiting. Denies any fever. Denies any prior rectal bleeding since her colon resection.  Past Medical History:  Diagnosis Date  . Allergic rhinitis 11/18/2013  . Asthma with COPD (chronic obstructive pulmonary disease) ([place]) 11/18/2013   Last Assessment & Plan:  No flair ups noted recently and breathing is essentially at baseline.    . Borderline hypothyroidism 11/18/2013   Last Assessment & Plan:  Energy and tsh are doing well  . Complete left bundle branch block (LBBB) 11/18/2013  . Constipation 11/18/2013  . HLD (hyperlipidemia) 11/18/2013   Last Assessment & Plan:  Appropriate diet is followed and no myalgia's are noted.    . Hypertension   . Hypertensive kidney disease with CKD stage III 11/18/2013   Last Assessment & Plan:  Is compliant with hypertensive medications without clear side effects or lack of control.  Nausea and itching are not symptomatic and nsaids are being avoided.      Patient Active Problem List   Diagnosis Date Noted  . Allergic rhinitis 11/18/2013  . Asthma with COPD (chronic obstructive pulmonary disease) ([place]) 11/18/2013  . Borderline hypothyroidism 11/18/2013  . Complete left bundle branch block (LBBB) 11/18/2013  . Constipation 11/18/2013  . Hypertensive kidney disease with CKD stage III 11/18/2013    Past Surgical History:  Procedure  Laterality Date  . ABDOMINAL HYSTERECTOMY    . Bladder Lift    . BREAST EXCISIONAL BIOPSY Left 15+ yrs ago   neg  . COLON RESECTION  2006   colon cancer  . SKIN CANCER EXCISION  2006   melanoma    Prior to Admission medications   Medication Sig Start Date End Date Taking? Authorizing Provider  aspirin EC 81 MG tablet Take by mouth. 08/08/14   [provider]  Cholecalciferol (VITAMIN D-1000 MAX ST) 1000 units tablet Take by mouth. 12/10/15   [provider]  fluticasone (FLONASE) 50 MCG/ACT nasal spray USE 2 SPRAYS IN EACH       NOSTRIL DAILY 11/19/15   [provider]  Fluticasone-Salmeterol (ADVAIR DISKUS) 250-50 MCG/DOSE AEPB Inhale into the lungs. 09/26/15   [provider]  losartan-hydrochlorothiazide (HYZAAR) 100-12.5 MG tablet TAKE 1 TABLET DAILY 03/27/16   [provider]  polyethylene glycol powder (GLYCOLAX/MIRALAX) powder Take by mouth. 02/13/16   [provider]  simvastatin (ZOCOR) 40 MG tablet TAKE 1 TABLET NIGHTLY FOR  CHOLESTEROL 07/19/15   [provider]    No Known Allergies  Family History  Problem Relation Age of Onset  . Breast cancer Daughter 38       passed at 45 from breast cancer    Social History Social History  Substance Use Topics  . Smoking status: Never Smoker  . Smokeless tobacco: Never Used  . Alcohol use No    Review of Systems Constitutional: Negative for fever. Cardiovascular: Negative for chest pain. Respiratory: Negative  for shortness of breath. Gastrointestinal: Negative for abdominal pain. Positive for dark/bloody stool. Negative for nausea or vomiting Genitourinary: Negative for dysuria Neurological: Negative for headache All other ROS negative  ____________________________________________   PHYSICAL EXAM:  VITAL SIGNS: ED Triage Vitals  Enc Vitals Group     BP 09/05/16 1711 116/72     Pulse Rate 09/05/16 1711 89     Resp 09/05/16 1711 16     Temp 09/05/16 1711  98 F (36.7 C)     Temp Source 09/05/16 1711 Oral     SpO2 09/05/16 1711 99 %     Weight 09/05/16 1712 145 lb (65.8 kg)     Height 09/05/16 1712 5' (1.524 m)     Head Circumference --      Peak Flow --      Pain Score --      Pain Loc --      Pain Edu? --      Excl. in [place]? --     Constitutional: Alert and oriented. Well appearing and in no distress. Eyes: Normal exam ENT   Head: Normocephalic and atraumatic.   Mouth/Throat: Mucous membranes are moist. Cardiovascular: Normal rate, regular rhythm. No murmur Respiratory: Normal respiratory effort without tachypnea nor retractions. Breath sounds are clear  Gastrointestinal: Soft and nontender. No distention.  Rectal exam shows small hemorrhoid, stool is grossly bloody, strongly guaiac positive. Musculoskeletal: Nontender with normal range of motion in all extremities.  Neurologic:  Normal speech and language. No gross focal neurologic deficits are appreciated. Skin:  Skin is warm, dry and intact.  Psychiatric: Mood and affect are normal. Speech and behavior are normal.   ____________________________________________    INITIAL IMPRESSION / ASSESSMENT AND PLAN / ED COURSE  Pertinent labs & imaging results that were available during my care of the patient were reviewed by me and considered in my medical decision making (see chart for details).  The patient presents the emergency department for rectal bleeding. Patient has gross bleeding on rectal exam strongly guaiac-positive small hemorrhoid but does not appear to be inflamed. Highly suspect hematochezia/lower GI bleed. We will check labs, patient will require admission to the hospital.  Patient's labs are largely at baseline including her H&H however given the patient's gross blood/hematochezia on rectal exam will admit to the hospital for further workup.  ____________________________________________   FINAL CLINICAL IMPRESSION(S) / ED DIAGNOSES  Lower GI bleed     Harvest Dark, MD 09/05/16 1901

## 2016-09-05 NOTE — ED Triage Notes (Signed)
Patient presents to the ED after two bowel movement she had today that she noticed some "red streaks" in them.  Patient states, "I don't know if it was something I ate or if it's blood."  Patient states stools were somewhat darker than normal.  Patient denies any weakness or dizziness.  Patient denies any abdominal pain.  Patient is in no obvious distress at this time.  Patient denies recently eating beets.

## 2016-09-06 ENCOUNTER — Encounter: Payer: Self-pay | Admitting: Gastroenterology

## 2016-09-06 DIAGNOSIS — K922 Gastrointestinal hemorrhage, unspecified: Secondary | ICD-10-CM | POA: Diagnosis not present

## 2016-09-06 LAB — BASIC METABOLIC PANEL
Anion gap: 3 — ABNORMAL LOW (ref 5–15)
BUN: 28 mg/dL — AB (ref 6–20)
CALCIUM: 8 mg/dL — AB (ref 8.9–10.3)
CO2: 25 mmol/L (ref 22–32)
CREATININE: 0.92 mg/dL (ref 0.44–1.00)
Chloride: 106 mmol/L (ref 101–111)
GFR, EST NON AFRICAN AMERICAN: 52 mL/min — AB (ref >60)
Glucose, Bld: 113 mg/dL — ABNORMAL HIGH (ref 65–99)
Potassium: 3.8 mmol/L (ref 3.5–5.1)
SODIUM: 134 mmol/L — AB (ref 135–145)

## 2016-09-06 LAB — ABO/RH: ABO/RH(D): O POS

## 2016-09-06 LAB — CBC
HCT: 26.1 % — ABNORMAL LOW (ref 35.0–47.0)
Hemoglobin: 8.9 g/dL — ABNORMAL LOW (ref 12.0–16.0)
MCH: 28.8 pg (ref 26.0–34.0)
MCHC: 33.9 g/dL (ref 32.0–36.0)
MCV: 84.9 fL (ref 80.0–100.0)
Platelets: 266 10*3/uL (ref 150–440)
RBC: 3.07 MIL/uL — AB (ref 3.80–5.20)
RDW: 15.2 % — AB (ref 11.5–14.5)
WBC: 9.4 10*3/uL (ref 3.6–11.0)

## 2016-09-06 LAB — PREPARE RBC (CROSSMATCH)

## 2016-09-06 LAB — HEMOGLOBIN AND HEMATOCRIT, BLOOD
HCT: 23.8 % — ABNORMAL LOW (ref 35.0–47.0)
HEMATOCRIT: 25.3 % — AB (ref 35.0–47.0)
HEMOGLOBIN: 8.2 g/dL — AB (ref 12.0–16.0)
Hemoglobin: 8.5 g/dL — ABNORMAL LOW (ref 12.0–16.0)

## 2016-09-06 LAB — HEMOGLOBIN: HEMOGLOBIN: 7.3 g/dL — AB (ref 12.0–16.0)

## 2016-09-06 MED ORDER — SODIUM CHLORIDE 0.9 % IV SOLN
Freq: Once | INTRAVENOUS | Status: AC
  Administered 2016-09-06: 18:00:00 via INTRAVENOUS

## 2016-09-06 NOTE — Care Management Obs Status (Signed)
[facility] NOTIFICATION   Patient Details  Name: Beth Chen MRN: [id number] Date of Birth: August 14, 1921   Medicare Observation Status Notification Given:  Yes (moon letter)    Mardene Speak, RN 09/06/2016, 3:13 PM

## 2016-09-06 NOTE — Consult Note (Signed)
Reason for Consult: GI Bleed Referring Physician: Dr. Delford Field Beth Chen is an 83 y.o. female.  HPI: History is obtained from the patient, her son and daughter-in-law at the bedside, and review of EPIC.   Admitted through the ED after seeing two spots of blood in her stool yesterday. Around midnight, developed frank hematochezia. Has had at least 5 large bloody bowel movements since that time, last approximately 2 hours ago. No abdominal pain, nausea, vomiting, dysphagia, odynophagia. GI ROS is otherwise negative.   History of right hemicolectomy for adenocarcinoma in 2006. No subsequent endoscopy due to her advanced age. No history of prior or interval GI bleeding.   She denies the use of any OTC medications or anticoagulants or antiplatelet agents.   I was able to locate a CT scan from 2013 that shows diverticulosis and an abdominal wall hernia. Her daughter-in-law believes they have been told about diverticulosis before.   On admission her hemoglobin was 11.5. This morning it declined to 8.9 and then to 8.2. BUN was 31 and is now 28. Crt was 1.12 and is now 0.92.   Past Medical History:  Diagnosis Date  . Allergic rhinitis 11/18/2013  . Asthma with COPD (chronic obstructive pulmonary disease) ([place]) 11/18/2013   Last Assessment & Plan:  No flair ups noted recently and breathing is essentially at baseline.    . Borderline hypothyroidism 11/18/2013   Last Assessment & Plan:  Energy and tsh are doing well  . Cancer [facility])    colon cancer- surgically removed in 2006  . Complete left bundle branch block (LBBB) 11/18/2013  . Constipation 11/18/2013  . HLD (hyperlipidemia) 11/18/2013   Last Assessment & Plan:  Appropriate diet is followed and no myalgia's are noted.    . Hypertension   . Hypertensive kidney disease with CKD stage III 11/18/2013   Last Assessment & Plan:  Is compliant with hypertensive medications without clear side effects or lack of control.  Nausea and itching are not symptomatic  and nsaids are being avoided.      Past Surgical History:  Procedure Laterality Date  . ABDOMINAL HYSTERECTOMY    . Bladder Lift    . BREAST EXCISIONAL BIOPSY Left 15+ yrs ago   neg  . COLON RESECTION  2006   colon cancer  . SKIN CANCER EXCISION  2006   melanoma    Family History  Problem Relation Age of Onset  . Breast cancer Daughter 66       passed at 28 from breast cancer    Social History:  reports that she has never smoked. She has never used smokeless tobacco. She reports that she does not drink alcohol. Her drug history is not on file.  Allergies: No Known Allergies  Medications:  I have reviewed the patient's current medications. Prior to Admission:  Prescriptions Prior to Admission  Medication Sig Dispense Refill Last Dose  . acetaminophen (TYLENOL) 500 MG tablet Take 500 mg by mouth every 6 (six) hours as needed.   prn at prn  . aspirin EC 81 MG tablet Take by mouth.   09/04/2016 at pm  . bisacodyl (DULCOLAX) 5 MG EC tablet Take 5 mg by mouth daily as needed for moderate constipation.   prn at prn  . Cholecalciferol (VITAMIN D-1000 MAX ST) 1000 units tablet Take 1,000 Units by mouth daily.    09/05/2016 at am  . fluticasone (FLONASE) 50 MCG/ACT nasal spray USE 2 SPRAYS IN EACH       NOSTRIL DAILY  09/04/2016 at pm  . Fluticasone-Salmeterol (ADVAIR DISKUS) 250-50 MCG/DOSE AEPB Inhale 1 puff into the lungs 2 (two) times daily.    09/04/2016 at pm  . losartan-hydrochlorothiazide (HYZAAR) 100-12.5 MG tablet TAKE 1 TABLET DAILY   09/05/2016 at am  . polyethylene glycol powder (GLYCOLAX/MIRALAX) powder Take 17 g by mouth every evening.    09/04/2016 at pm  . simvastatin (ZOCOR) 40 MG tablet TAKE 1 TABLET NIGHTLY FOR  CHOLESTEROL   09/04/2016 at pm   Scheduled: . fluticasone  2 spray Each Nare Daily  . mometasone-formoterol  2 puff Inhalation BID  . pantoprazole (PROTONIX) IV  40 mg Intravenous Q12H  . simvastatin  40 mg Oral q1800   Continuous: . sodium chloride 75 mL/hr at  09/06/16 1030   VOZ:[id number], docusate sodium  Results for orders placed or performed during the hospital encounter of 09/05/16 (from the past 48 hour(s))  CBC with Differential     Status: Abnormal   Collection Time: 09/05/16  6:14 PM  Result Value Ref Range   WBC 12.9 (H) 3.6 - 11.0 K/uL   RBC 4.06 3.80 - 5.20 MIL/uL   Hemoglobin 11.5 (L) 12.0 - 16.0 g/dL   HCT 35.0 35.0 - 47.0 %   MCV 86.3 80.0 - 100.0 fL   MCH 28.4 26.0 - 34.0 pg   MCHC 33.0 32.0 - 36.0 g/dL   RDW 14.8 (H) 11.5 - 14.5 %   Platelets 351 150 - 440 K/uL   Neutrophils Relative % 82 %   Neutro Abs 10.6 (H) 1.4 - 6.5 K/uL   Lymphocytes Relative 10 %   Lymphs Abs 1.3 1.0 - 3.6 K/uL   Monocytes Relative 5 %   Monocytes Absolute 0.7 0.2 - 0.9 K/uL   Eosinophils Relative 2 %   Eosinophils Absolute 0.2 0 - 0.7 K/uL   Basophils Relative 1 %   Basophils Absolute 0.1 0 - 0.1 K/uL  Comprehensive metabolic panel     Status: Abnormal   Collection Time: 09/05/16  6:14 PM  Result Value Ref Range   Sodium 135 135 - 145 mmol/L   Potassium 3.8 3.5 - 5.1 mmol/L   Chloride 101 101 - 111 mmol/L   CO2 27 22 - 32 mmol/L   Glucose, Bld 108 (H) 65 - 99 mg/dL   BUN 31 (H) 6 - 20 mg/dL   Creatinine, Ser 1.12 (H) 0.44 - 1.00 mg/dL   Calcium 8.9 8.9 - 10.3 mg/dL   Total Protein 6.7 6.5 - 8.1 g/dL   Albumin 3.9 3.5 - 5.0 g/dL   AST 28 15 - 41 U/L   ALT 18 14 - 54 U/L   Alkaline Phosphatase 63 38 - 126 U/L   Total Bilirubin 0.5 0.3 - 1.2 mg/dL   GFR calc non Af Amer 41 (L) >60 mL/min   GFR calc Af Amer 48 (L) >60 mL/min    Comment: (NOTE) The eGFR has been calculated using the CKD EPI equation. This calculation has not been validated in all clinical situations. eGFR's persistently <60 mL/min signify possible Chronic Kidney Disease.    Anion gap 7 5 - 15  Type and screen [facility]     Status: None   Collection Time: 09/05/16  6:14 PM  Result Value Ref Range   ABO/RH(D) O POS    Antibody Screen NEG     Sample Expiration 56/38/7564   Basic metabolic panel     Status: Abnormal   Collection Time: 09/06/16  2:06 AM  Result Value Ref Range   Sodium 134 (L) 135 - 145 mmol/L   Potassium 3.8 3.5 - 5.1 mmol/L   Chloride 106 101 - 111 mmol/L   CO2 25 22 - 32 mmol/L   Glucose, Bld 113 (H) 65 - 99 mg/dL   BUN 28 (H) 6 - 20 mg/dL   Creatinine, Ser 0.92 0.44 - 1.00 mg/dL   Calcium 8.0 (L) 8.9 - 10.3 mg/dL   GFR calc non Af Amer 52 (L) >60 mL/min   GFR calc Af Amer >60 >60 mL/min    Comment: (NOTE) The eGFR has been calculated using the CKD EPI equation. This calculation has not been validated in all clinical situations. eGFR's persistently <60 mL/min signify possible Chronic Kidney Disease.    Anion gap 3 (L) 5 - 15  CBC     Status: Abnormal   Collection Time: 09/06/16  2:06 AM  Result Value Ref Range   WBC 9.4 3.6 - 11.0 K/uL   RBC 3.07 (L) 3.80 - 5.20 MIL/uL   Hemoglobin 8.9 (L) 12.0 - 16.0 g/dL   HCT 26.1 (L) 35.0 - 47.0 %   MCV 84.9 80.0 - 100.0 fL   MCH 28.8 26.0 - 34.0 pg   MCHC 33.9 32.0 - 36.0 g/dL   RDW 15.2 (H) 11.5 - 14.5 %   Platelets 266 150 - 440 K/uL  Hemoglobin and hematocrit, blood     Status: Abnormal   Collection Time: 09/06/16  7:08 AM  Result Value Ref Range   Hemoglobin 8.2 (L) 12.0 - 16.0 g/dL   HCT 23.8 (L) 35.0 - 47.0 %    No results found.  Review of Systems  Constitutional: Negative.   HENT: Negative.   Eyes: Negative.   Respiratory: Negative.   Cardiovascular: Negative.   Gastrointestinal: Negative.   Genitourinary: Negative.   Musculoskeletal: Negative.   Skin: Negative.   Neurological: Negative.   Endo/Heme/Allergies: Negative.   Psychiatric/Behavioral: Negative.    Blood pressure (!) 100/52, pulse 73, temperature 98.4 F (36.9 C), temperature source Oral, resp. rate 20, height 5' (1.524 m), weight 146 lb 1.6 oz (66.3 kg), SpO2 98 %. Physical Exam  Constitutional: She is oriented to person, place, and time. She appears well-developed  and well-nourished. No distress.  HENT:  Head: Normocephalic and atraumatic.  Mouth/Throat: No oropharyngeal exudate.  Eyes: Conjunctivae are normal. No scleral icterus.  Neck: Neck supple. No thyromegaly present.  Cardiovascular: Normal rate and regular rhythm.   Respiratory: Effort normal and breath sounds normal.  GI: Soft. Bowel sounds are normal. She exhibits no distension and no mass. There is no tenderness. There is no rebound and no guarding.  Musculoskeletal: Normal range of motion. She exhibits no deformity.  Neurological: She is alert and oriented to person, place, and time.  Skin: Skin is dry. No rash noted.  Psychiatric: She has a normal mood and affect. Thought content normal.    Assessment/Plan: GI Blood loss anemia with ongoing, painless hematochezia History of colon cancer s/p right hemicolectomy 2006    - no surveillance endoscopy following surgery due to advanced age Diverticulosis by CT scan History of constipation Advanced age  Suspected diverticular bleed. Recommend clear liquid diet. Serial hgb/hct with transfusion as necessary. We discussed the option of proceeding with colonoscopy, particularly in the setting of her history of colon cancer. Given her advanced age, she is appropriately cautious about the risks of the procedure and anesthesia. If the bleeding stops and this resolves as expected for a diverticular  bleed, may be able to avoid or defer colonoscopy. However, if the bleeding continues, alternative diagnoses must be considered. Will reassess the need and possible timing of colonoscopy tomorrow.   Dr. Allen Norris or Dr. Vicente Males will see Beth Chen tomorrow.  Thank you for the opportunity to participate in her care. Please call with any additional questions or concerns.   Thornton Park 09/06/2016, 1:21 PM

## 2016-09-06 NOTE — Progress Notes (Signed)
[place] at [place] NAME: Beth Chen    MR#:  [id number]  DATE OF BIRTH:  [birth year]  SUBJECTIVE:  Team and with lower rectal bleed. Denies any abdominal pain. Had about 5 small to moderate rectal bleed.  REVIEW OF SYSTEMS:   Review of Systems  Constitutional: Negative for chills, fever and weight loss.  HENT: Negative for ear discharge, ear pain and nosebleeds.   Eyes: Negative for blurred vision, pain and discharge.  Respiratory: Negative for sputum production, shortness of breath, wheezing and stridor.   Cardiovascular: Negative for chest pain, palpitations, orthopnea and PND.  Gastrointestinal: Positive for blood in stool. Negative for abdominal pain, diarrhea, nausea and vomiting.  Genitourinary: Negative for frequency and urgency.  Musculoskeletal: Negative for back pain and joint pain.  Neurological: Negative for sensory change, speech change, focal weakness and weakness.  Psychiatric/Behavioral: Negative for depression and hallucinations. The patient is not nervous/anxious.    Tolerating Diet:cld Tolerating PT: Pending  DRUG ALLERGIES:  No Known Allergies  VITALS:  Blood pressure 109/66, pulse 77, temperature 99 F (37.2 C), temperature source Oral, resp. rate 20, height 5' (1.524 m), weight 66.3 kg (146 lb 1.6 oz), SpO2 96 %.  PHYSICAL EXAMINATION:   Physical Exam  GENERAL:  83 y.o.-year-old patient lying in the bed with no acute distress.  EYES: Pupils equal, round, reactive to light and accommodation. No scleral icterus. Extraocular muscles intact.  HEENT: Head atraumatic, normocephalic. Oropharynx and nasopharynx clear. Pallor+ NECK:  Supple, no jugular venous distention. No thyroid enlargement, no tenderness.  LUNGS: Normal breath sounds bilaterally, no wheezing, rales, rhonchi. No use of accessory muscles of respiration.  CARDIOVASCULAR: S1, S2 normal. No murmurs, rubs, or gallops.  ABDOMEN: Soft, nontender,  nondistended. Bowel sounds present. No organomegaly or mass.  EXTREMITIES: No cyanosis, clubbing or edema b/l.    NEUROLOGIC: Cranial nerves II through XII are intact. No focal Motor or sensory deficits b/l.   PSYCHIATRIC:  patient is alert and oriented x 3.  SKIN: No obvious rash, lesion, or ulcer.   LABORATORY PANEL:  CBC  Recent Labs Lab 09/06/16 0206 09/06/16 0708  WBC 9.4  --   HGB 8.9* 8.2*  HCT 26.1* 23.8*  PLT 266  --     Chemistries   Recent Labs Lab 09/05/16 1814 09/06/16 0206  NA 135 134*  K 3.8 3.8  CL 101 106  CO2 27 25  GLUCOSE 108* 113*  BUN 31* 28*  CREATININE 1.12* 0.92  CALCIUM 8.9 8.0*  AST 28  --   ALT 18  --   ALKPHOS 63  --   BILITOT 0.5  --    Cardiac Enzymes No results for input(s): TROPONINI in the last 168 hours. RADIOLOGY:  No results found. ASSESSMENT AND PLAN:  Beth Chen  is a 83 y.o. female with a known history of Asthma, HLD, Htn, Colon cancer with resection in 2006- noted red blood in her stool today, twice at home. Denies any associated abdominal pain, vomiting/nausea. Denies similar symptom in past.  * Gi bleed- Likely Lower GI, appears diverticular given painless GI bleed and history of diverticulosis    came in with hemoglobin of 11.2--- 8.9--- 8.2.    will transfuse hemoglobin less than 8. Patient is hemodynamically stable   Clear liquid diet, GI consulted Hold aspirin  * Htn   Hold oral meds now due to GI bleed.  * Asthma   No exacerbation symptoms, cont inhalers.  *  Hyperlipidemia   COnt statin  Case discussed with Care Management/Social Worker. Management plans discussed with the patient, family and they are in agreement.  CODE STATUS: DO NOT RESUSCITATE  DVT Prophylaxis: SCD TOTAL TIME TAKING CARE OF THIS PATIENT: 25 minutes.  >50% time spent on counselling and coordination of care patient and family  POSSIBLE D/C IN 1-2 DAYS, DEPENDING ON CLINICAL CONDITION.  Note: This dictation was prepared with  Dragon dictation along with smaller phrase technology. Any transcriptional errors that result from this process are unintentional.  Bethaney Oshana M.D on 09/06/2016 at 10:53 AM  Between 7am to 6pm - Pager - [phone]  After 6pm go to [email or web address] - password [email or web address] [facility]  Office  [phone]  CC: Primary care physician; Kirk Ruths, MD

## 2016-09-07 DIAGNOSIS — K5900 Constipation, unspecified: Secondary | ICD-10-CM | POA: Diagnosis present

## 2016-09-07 DIAGNOSIS — K922 Gastrointestinal hemorrhage, unspecified: Secondary | ICD-10-CM | POA: Diagnosis not present

## 2016-09-07 DIAGNOSIS — K5731 Diverticulosis of large intestine without perforation or abscess with bleeding: Secondary | ICD-10-CM | POA: Diagnosis present

## 2016-09-07 DIAGNOSIS — Z85820 Personal history of malignant melanoma of skin: Secondary | ICD-10-CM | POA: Diagnosis not present

## 2016-09-07 DIAGNOSIS — Z7951 Long term (current) use of inhaled steroids: Secondary | ICD-10-CM | POA: Diagnosis not present

## 2016-09-07 DIAGNOSIS — E785 Hyperlipidemia, unspecified: Secondary | ICD-10-CM | POA: Diagnosis present

## 2016-09-07 DIAGNOSIS — Z803 Family history of malignant neoplasm of breast: Secondary | ICD-10-CM | POA: Diagnosis not present

## 2016-09-07 DIAGNOSIS — N183 Chronic kidney disease, stage 3 (moderate): Secondary | ICD-10-CM | POA: Diagnosis present

## 2016-09-07 DIAGNOSIS — K439 Ventral hernia without obstruction or gangrene: Secondary | ICD-10-CM | POA: Diagnosis present

## 2016-09-07 DIAGNOSIS — Z79899 Other long term (current) drug therapy: Secondary | ICD-10-CM | POA: Diagnosis not present

## 2016-09-07 DIAGNOSIS — Z7982 Long term (current) use of aspirin: Secondary | ICD-10-CM | POA: Diagnosis not present

## 2016-09-07 DIAGNOSIS — D62 Acute posthemorrhagic anemia: Secondary | ICD-10-CM | POA: Diagnosis present

## 2016-09-07 DIAGNOSIS — J449 Chronic obstructive pulmonary disease, unspecified: Secondary | ICD-10-CM | POA: Diagnosis present

## 2016-09-07 DIAGNOSIS — Z66 Do not resuscitate: Secondary | ICD-10-CM | POA: Diagnosis present

## 2016-09-07 DIAGNOSIS — Z90710 Acquired absence of both cervix and uterus: Secondary | ICD-10-CM | POA: Diagnosis not present

## 2016-09-07 DIAGNOSIS — Z9049 Acquired absence of other specified parts of digestive tract: Secondary | ICD-10-CM | POA: Diagnosis not present

## 2016-09-07 DIAGNOSIS — I129 Hypertensive chronic kidney disease with stage 1 through stage 4 chronic kidney disease, or unspecified chronic kidney disease: Secondary | ICD-10-CM | POA: Diagnosis present

## 2016-09-07 DIAGNOSIS — Z85038 Personal history of other malignant neoplasm of large intestine: Secondary | ICD-10-CM | POA: Diagnosis not present

## 2016-09-07 LAB — HEMOGLOBIN
Hemoglobin: 9 g/dL — ABNORMAL LOW (ref 12.0–16.0)
Hemoglobin: 8.4 g/dL — ABNORMAL LOW (ref 12.0–16.0)

## 2016-09-07 LAB — TYPE AND SCREEN
ABO/RH(D): O POS
Antibody Screen: NEGATIVE
UNIT DIVISION: 0

## 2016-09-07 LAB — BPAM RBC
Blood Product Expiration Date: 201807212359
ISSUE DATE / TIME: 201807081716
UNIT TYPE AND RH: 5100

## 2016-09-07 NOTE — Care Management (Signed)
Patient from independent living.  Patient ambulated around nursing staff staff twice with walker.

## 2016-09-07 NOTE — Progress Notes (Signed)
Beth Chen at [place] NAME: Beth Chen    MR#:  [id number]  DATE OF BIRTH:  06-10-1921  SUBJECTIVE:  Came in with lower rectal bleed. Denies any abdominal pain. Pt had 2 normal brown stools y'day. Today some dark blood mixed with stools No abd pain  REVIEW OF SYSTEMS:   Review of Systems  Constitutional: Negative for chills, fever and weight loss.  HENT: Negative for ear discharge, ear pain and nosebleeds.   Eyes: Negative for blurred vision, pain and discharge.  Respiratory: Negative for sputum production, shortness of breath, wheezing and stridor.   Cardiovascular: Negative for chest pain, palpitations, orthopnea and PND.  Gastrointestinal: Positive for blood in stool. Negative for abdominal pain, diarrhea, nausea and vomiting.  Genitourinary: Negative for frequency and urgency.  Musculoskeletal: Negative for back pain and joint pain.  Neurological: Negative for sensory change, speech change, focal weakness and weakness.  Psychiatric/Behavioral: Negative for depression and hallucinations. The patient is not nervous/anxious.    Tolerating Diet:cld Tolerating PT: Pending  DRUG ALLERGIES:  No Known Allergies  VITALS:  Blood pressure 134/60, pulse 71, temperature 98.1 F (36.7 C), temperature source Oral, resp. rate 20, height 5' (1.524 m), weight 66.3 kg (146 lb 1.6 oz), SpO2 99 %.  PHYSICAL EXAMINATION:   Physical Exam  GENERAL:  83 y.o.-year-old patient lying in the bed with no acute distress.  EYES: Pupils equal, round, reactive to light and accommodation. No scleral icterus. Extraocular muscles intact.  HEENT: Head atraumatic, normocephalic. Oropharynx and nasopharynx clear. Pallor+ NECK:  Supple, no jugular venous distention. No thyroid enlargement, no tenderness.  LUNGS: Normal breath sounds bilaterally, no wheezing, rales, rhonchi. No use of accessory muscles of respiration.  CARDIOVASCULAR: S1, S2 normal. No murmurs,  rubs, or gallops.  ABDOMEN: Soft, nontender, nondistended. Bowel sounds present. No organomegaly or mass.  EXTREMITIES: No cyanosis, clubbing or edema b/l.    NEUROLOGIC: Cranial nerves II through XII are intact. No focal Motor or sensory deficits b/l.   PSYCHIATRIC:  patient is alert and oriented x 3.  SKIN: No obvious rash, lesion, or ulcer.   LABORATORY PANEL:  CBC  Recent Labs Lab 09/06/16 0206  09/06/16 2154 09/07/16 0727  WBC 9.4  --   --   --   HGB 8.9*  < > 8.5* 9.0*  HCT 26.1*  < > 25.3*  --   PLT 266  --   --   --   < > = values in this interval not displayed.  Chemistries   Recent Labs Lab 09/05/16 1814 09/06/16 0206  NA 135 134*  K 3.8 3.8  CL 101 106  CO2 27 25  GLUCOSE 108* 113*  BUN 31* 28*  CREATININE 1.12* 0.92  CALCIUM 8.9 8.0*  AST 28  --   ALT 18  --   ALKPHOS 63  --   BILITOT 0.5  --    Cardiac Enzymes No results for input(s): TROPONINI in the last 168 hours. RADIOLOGY:  No results found. ASSESSMENT AND PLAN:  Andrey Hoobler  is a 83 y.o. female with a known history of Asthma, HLD, Htn, Colon cancer with resection in 2006- noted red blood in her stool today, twice at home. Denies any associated abdominal pain, vomiting/nausea. Denies similar symptom in past.  * Gi bleed- Likely Lower GI, appears diverticular given painless GI bleed and history of diverticulosis came in with hemoglobin of 11.2--- 8.9--- 8.2.--7.3--1 unit BT---9.0    - Clear liquid  diet -GI consulted  -Hold aspirin  * Htn   meds held -bp stable  * Asthma   No exacerbation symptoms, cont inhalers.  * Hyperlipidemia   COnt statin  *pt ambulates well with walker  Case discussed with Care Management/Social Worker. Management plans discussed with the patient, family and they are in agreement.  CODE STATUS: DO NOT RESUSCITATE  DVT Prophylaxis: SCD TOTAL TIME TAKING CARE OF THIS PATIENT: 25 minutes.  >50% time spent on counselling and coordination of care patient and  family  POSSIBLE D/C IN 1-2 DAYS, DEPENDING ON CLINICAL CONDITION.  Note: This dictation was prepared with Dragon dictation along with smaller phrase technology. Any transcriptional errors that result from this process are unintentional.  Ember Gottwald M.D on 09/07/2016 at 10:19 AM  Between 7am to 6pm - Pager - [phone]  After 6pm go to [email or web address] - password [email or web address] [facility]  Office  [phone]  CC: Primary care physician; Kirk Ruths, MD

## 2016-09-07 NOTE — Progress Notes (Signed)
   Jonathon Bellows MD, MRCP(U.K) [address]  [place]  [place], [place] [address]  Main: [phone]    Beth Chen is being followed for rectal bleeding  Day 1 of follow up   Subjective: One bowel movement today , dark, no abdominal pain, wants to eat and walk    Objective: Vital signs in last 24 hours: Vitals:   09/06/16 1720 09/06/16 1743 09/06/16 2044 09/07/16 0501  BP: 105/77 108/77 (!) 109/49 134/60  Pulse: 80 74 71 71  Resp: 17 16 16 20   Temp: 98.2 F (36.8 C) 98.1 F (36.7 C) 98.2 F (36.8 C) 98.1 F (36.7 C)  TempSrc: Oral Oral Oral Oral  SpO2: 99% 98% 98% 99%  Weight:      Height:       Weight change:   Intake/Output Summary (Last 24 hours) at 09/07/16 1022 Last data filed at 09/07/16 0023  Gross per 24 hour  Intake          1401.75 ml  Output                0 ml  Net          1401.75 ml     Exam: Heart:: Regular rate and rhythm, S1S2 present or without murmur or extra heart sounds Lungs: normal, clear to auscultation and clear to auscultation and percussion Abdomen: soft, nontender, normal bowel sounds   Lab Results: CBC Latest Ref Rng & Units 09/07/2016 09/06/2016 09/06/2016  WBC 3.6 - 11.0 K/uL - - -  Hemoglobin 12.0 - 16.0 g/dL 9.0(L) 8.5(L) 7.3(L)  Hematocrit 35.0 - 47.0 % - 25.3(L) -  Platelets 150 - 440 K/uL - - -    Micro Results: No results found for this or any previous visit (from the past 240 hour(s)). Studies/Results: No results found. Medications: I have reviewed the patient's current medications. Scheduled Meds: . fluticasone  2 spray Each Nare Daily  . mometasone-formoterol  2 puff Inhalation BID  . pantoprazole (PROTONIX) IV  40 mg Intravenous Q12H  . simvastatin  40 mg Oral q1800   Continuous Infusions: PRN Meds:.acetaminophen, docusate sodium   Assessment: Principal Problem:   GI bleed  Beth Chen 83 y.o. female admitted with hematochezia, h/o diverticulosis and rt sided colon cancer , s/p rt hemicolectomy. Her  history is suggestive of a diverticular bleed. Hb has been stable. Based on her age risks of endoscopy are higher than normal and it may not be a bad idea to not perform any endoscopy. If she were to continue bleeding then we would have no choice but to proceed with endoscopy.   Plan: 1. Watch for further bleeding  2. Advance to soft diet  3. If stable and no further bleeding can be discharged by tomorrow.    LOS: 0 days   Beth Chen 09/07/2016, 10:22 AM

## 2016-09-08 LAB — HEMOGLOBIN: HEMOGLOBIN: 8.3 g/dL — AB (ref 12.0–16.0)

## 2016-09-08 MED ORDER — ASPIRIN EC 81 MG PO TBEC: 81.0000 mg | DELAYED_RELEASE_TABLET | Freq: Every day | ORAL | 0 refills | Status: DC

## 2016-09-08 NOTE — Discharge Summary (Signed)
[facility] [place] NAME: Beth Chen    MR#:  [id number]  DATE OF BIRTH:  [birth year]  DATE OF ADMISSION:  09/05/2016 ADMITTING PHYSICIAN: Vaughan Basta, MD  DATE OF DISCHARGE: 09/08/16  PRIMARY CARE PHYSICIAN: Kirk Ruths, MD    ADMISSION DIAGNOSIS:  Lower GI bleed [K92.2]  DISCHARGE DIAGNOSIS:  Lower GI bleed appears diverticular Acute post hemorrhagic anemia s/p 1 unit BT  SECONDARY DIAGNOSIS:   Past Medical History:  Diagnosis Date  . Allergic rhinitis 11/18/2013  . Asthma with COPD (chronic obstructive pulmonary disease) ([place]) 11/18/2013   Last Assessment & Plan:  No flair ups noted recently and breathing is essentially at baseline.    . Borderline hypothyroidism 11/18/2013   Last Assessment & Plan:  Energy and tsh are doing well  . Cancer [facility])    colon cancer- surgically removed in 2006  . Complete left bundle branch block (LBBB) 11/18/2013  . Constipation 11/18/2013  . HLD (hyperlipidemia) 11/18/2013   Last Assessment & Plan:  Appropriate diet is followed and no myalgia's are noted.    . Hypertension   . Hypertensive kidney disease with CKD stage III 11/18/2013   Last Assessment & Plan:  Is compliant with hypertensive medications without clear side effects or lack of control.  Nausea and itching are not symptomatic and nsaids are being avoided.      HOSPITAL COURSE:   [name] a 83 y.o.femalewith a known history of Asthma, HLD, Htn, Colon cancer with resection in 2006- noted red blood in her stool today, twice at home. Denies any associated abdominal pain, vomiting/nausea. Denies similar symptom in past.  * Gi bleed- Likely Lower GI, appears diverticular given painless GI bleed and history of diverticulosis came in with hemoglobin of 11.2--- 8.9--- 8.2.--7.3--1 unit BT---9.0--8.3  -Clear liquid diet---now tolerating soft diet -GI consult appreciated. Pt will f/u dr Vicente Males -Hold aspirin for  another week  * Htn meds held -bp stable  * Asthma No exacerbation symptoms, cont inhalers.  * Hyperlipidemia COnt statin  *pt ambulates well with walker  D/c to her independent living facility today Spoke with son and dter CONSULTS OBTAINED:  Treatment Team:  Thornton Park, MD [name], MD  DRUG ALLERGIES:  No Known Allergies  DISCHARGE MEDICATIONS:   Current Discharge Medication List    CONTINUE these medications which have CHANGED   Details  aspirin EC 81 MG tablet Take 1 tablet (81 mg total) by mouth daily. Qty: 1 tablet, Refills: 0      CONTINUE these medications which have NOT CHANGED   Details  acetaminophen (TYLENOL) 500 MG tablet Take 500 mg by mouth every 6 (six) hours as needed.    bisacodyl (DULCOLAX) 5 MG EC tablet Take 5 mg by mouth daily as needed for moderate constipation.    Cholecalciferol (VITAMIN D-1000 MAX ST) 1000 units tablet Take 1,000 Units by mouth daily.     fluticasone (FLONASE) 50 MCG/ACT nasal spray USE 2 SPRAYS IN EACH       NOSTRIL DAILY    Fluticasone-Salmeterol (ADVAIR DISKUS) 250-50 MCG/DOSE AEPB Inhale 1 puff into the lungs 2 (two) times daily.     losartan-hydrochlorothiazide (HYZAAR) 100-12.5 MG tablet TAKE 1 TABLET DAILY    polyethylene glycol powder (GLYCOLAX/MIRALAX) powder Take 17 g by mouth every evening.     simvastatin (ZOCOR) 40 MG tablet TAKE 1 TABLET NIGHTLY FOR  CHOLESTEROL        If you experience worsening of your  admission symptoms, develop shortness of breath, life threatening emergency, suicidal or homicidal thoughts you must seek medical attention immediately by calling 911 or calling your MD immediately  if symptoms less severe.  You Must read complete instructions/literature along with all the possible adverse reactions/side effects for all the Medicines you take and that have been prescribed to you. Take any new Medicines after you have completely understood and accept all the possible  adverse reactions/side effects.   Please note  You were cared for by a hospitalist during your hospital stay. If you have any questions about your discharge medications or the care you received while you were in the hospital after you are discharged, you can call the unit and asked to speak with the hospitalist on call if the hospitalist that took care of you is not available. Once you are discharged, your primary care physician will handle any further medical issues. Please note that NO REFILLS for any discharge medications will be authorized once you are discharged, as it is imperative that you return to your primary care physician (or establish a relationship with a primary care physician if you do not have one) for your aftercare needs so that they can reassess your need for medications and monitor your lab values. Today   SUBJECTIVE   Doing well  VITAL SIGNS:  Blood pressure (!) 110/51, pulse 67, temperature 98.2 F (36.8 C), temperature source Oral, resp. rate 16, height 5' (1.524 m), weight 66.3 kg (146 lb 1.6 oz), SpO2 98 %.  I/O:   Intake/Output Summary (Last 24 hours) at 09/08/16 0944 Last data filed at 09/08/16 0500  Gross per 24 hour  Intake              600 ml  Output                0 ml  Net              600 ml    PHYSICAL EXAMINATION:  GENERAL:  83 y.o.-year-old patient lying in the bed with no acute distress.  EYES: Pupils equal, round, reactive to light and accommodation. No scleral icterus. Extraocular muscles intact.  HEENT: Head atraumatic, normocephalic. Oropharynx and nasopharynx clear. Mild pallor NECK:  Supple, no jugular venous distention. No thyroid enlargement, no tenderness.  LUNGS: Normal breath sounds bilaterally, no wheezing, rales,rhonchi or crepitation. No use of accessory muscles of respiration.  CARDIOVASCULAR: S1, S2 normal. No murmurs, rubs, or gallops.  ABDOMEN: Soft, non-tender, non-distended. Bowel sounds present. No organomegaly or mass.   EXTREMITIES: No pedal edema, cyanosis, or clubbing.  NEUROLOGIC: Cranial nerves II through XII are intact. Muscle strength 5/5 in all extremities. Sensation intact. Gait not checked.  PSYCHIATRIC: The patient is alert and oriented x 3.  SKIN: No obvious rash, lesion, or ulcer.   DATA REVIEW:   CBC   Recent Labs Lab 09/06/16 0206  09/06/16 2154  09/08/16 0721  WBC 9.4  --   --   --   --   HGB 8.9*  < > 8.5*  < > 8.3*  HCT 26.1*  < > 25.3*  --   --   PLT 266  --   --   --   --   < > = values in this interval not displayed.  Chemistries   Recent Labs Lab 09/05/16 1814 09/06/16 0206  NA 135 134*  K 3.8 3.8  CL 101 106  CO2 27 25  GLUCOSE 108* 113*  BUN 31* 28*  CREATININE 1.12* 0.92  CALCIUM 8.9 8.0*  AST 28  --   ALT 18  --   ALKPHOS 63  --   BILITOT 0.5  --     Microbiology Results   No results found for this or any previous visit (from the past 240 hour(s)).  RADIOLOGY:  No results found.   Management plans discussed with the patient, family and they are in agreement.  CODE STATUS:     Code Status Orders        Start     Ordered   09/05/16 2051  Do not attempt resuscitation (DNR)  Continuous    Question Answer Comment  In the event of cardiac or respiratory ARREST Do not call a "code blue"   In the event of cardiac or respiratory ARREST Do not perform Intubation, CPR, defibrillation or ACLS   In the event of cardiac or respiratory ARREST Use medication by any route, position, wound care, and other measures to relive pain and suffering. May use oxygen, suction and manual treatment of airway obstruction as needed for comfort.      09/05/16 2050    Code Status History    Date Active Date Inactive Code Status Order ID Comments User Context   This patient has a current code status but no historical code status.    Advance Directive Documentation     Most Recent Value  Type of Advance Directive  Healthcare Power of Attorney, Living will   Pre-existing out of facility DNR order (yellow form or pink MOST form)  -  "MOST" Form in Place?  -      TOTAL TIME TAKING CARE OF THIS PATIENT: 40 minutes.    Anett Ranker M.D on 09/08/2016 at 9:44 AM  Between 7am to 6pm - Pager - [phone] After 6pm go to [email or web address] - password [email or web address] [facility]  Office  [phone]  CC: Primary care physician; Kirk Ruths, MD

## 2016-09-08 NOTE — Progress Notes (Signed)
Discharge instructions reviewed with patient and son. IV removed without complication. Follow up appointments reviewed. ASA start date discussed. Patient requests OTC acid reducer. Discussed with patient. Belongings returned and volunteers called to transport patient to lobby via w/c for discharge.

## 2016-09-08 NOTE — Discharge Instructions (Signed)
Gastrointestinal Bleeding °Gastrointestinal bleeding is bleeding somewhere along the path food travels through the body (digestive tract). This path is anywhere between the mouth and the opening of the butt (anus). You may have blood in your poop (stools) or have black poop. If you throw up (vomit), there may be blood in it. °This condition can be mild, serious, or even life-threatening. If you have a lot of bleeding, you may need to stay in the hospital. °Follow these instructions at home: °· Take over-the-counter and prescription medicines only as told by your doctor. °· Eat foods that have a lot of fiber in them. These foods include whole grains, fruits, and vegetables. You can also try eating 1-3 prunes each day. °· Drink enough fluid to keep your pee (urine) clear or pale yellow. °· Keep all follow-up visits as told by your doctor. This is important. °Contact a doctor if: °· Your symptoms do not get better. °Get help right away if: °· Your bleeding gets worse. °· You feel dizzy or you pass out (faint). °· You feel weak. °· You have very bad cramps in your back or belly (abdomen). °· You pass large clumps of blood (clots) in your poop. °· Your symptoms are getting worse. °This information is not intended to replace advice given to you by your health care provider. Make sure you discuss any questions you have with your health care provider. °Document Released: 11/26/2007 Document Revised: 07/25/2015 Document Reviewed: 08/06/2014 °Elsevier Interactive Patient Education © 2018 Elsevier Inc. ° °

## 2016-09-08 NOTE — Care Management (Signed)
Patient to discharge back to independent living today.  There are not home health needs identified at this time. Family to transport at discharge.  Patient has her walker at the bedside.  RNCM signing off.

## 2016-09-09 ENCOUNTER — Encounter
Admission: RE | Admit: 2016-09-09 | Discharge: 2016-09-09 | Disposition: A | Discharge status: Home / Self Care | Payer: Medicare Other | Source: Ambulatory Visit | Attending: Internal Medicine | Admitting: Internal Medicine

## 2016-09-09 DIAGNOSIS — K922 Gastrointestinal hemorrhage, unspecified: Secondary | ICD-10-CM | POA: Insufficient documentation

## 2016-09-11 ENCOUNTER — Non-Acute Institutional Stay (SKILLED_NURSING_FACILITY): Payer: Medicare Other | Admitting: Gerontology

## 2016-09-11 DIAGNOSIS — K5900 Constipation, unspecified: Secondary | ICD-10-CM | POA: Diagnosis not present

## 2016-09-11 DIAGNOSIS — E876 Hypokalemia: Secondary | ICD-10-CM | POA: Diagnosis not present

## 2016-09-11 DIAGNOSIS — K922 Gastrointestinal hemorrhage, unspecified: Secondary | ICD-10-CM | POA: Diagnosis not present

## 2016-09-11 LAB — COMPREHENSIVE METABOLIC PANEL
ALBUMIN: 3.6 g/dL (ref 3.5–5.0)
ALT: 14 U/L (ref 14–54)
ANION GAP: 9 (ref 5–15)
AST: 22 U/L (ref 15–41)
Alkaline Phosphatase: 49 U/L (ref 38–126)
BUN: 19 mg/dL (ref 6–20)
CHLORIDE: 98 mmol/L — AB (ref 101–111)
CO2: 27 mmol/L (ref 22–32)
CREATININE: 0.86 mg/dL (ref 0.44–1.00)
Calcium: 8.5 mg/dL — ABNORMAL LOW (ref 8.9–10.3)
GFR calc non Af Amer: 57 mL/min — ABNORMAL LOW (ref >60)
GLUCOSE: 113 mg/dL — AB (ref 65–99)
Potassium: 2.9 mmol/L — ABNORMAL LOW (ref 3.5–5.1)
SODIUM: 134 mmol/L — AB (ref 135–145)
Total Bilirubin: 1.1 mg/dL (ref 0.3–1.2)
Total Protein: 6.2 g/dL — ABNORMAL LOW (ref 6.5–8.1)

## 2016-09-11 LAB — CBC WITH DIFFERENTIAL/PLATELET
BASOS PCT: 0 %
Basophils Absolute: 0 10*3/uL (ref 0–0.1)
EOS ABS: 0 10*3/uL (ref 0–0.7)
EOS PCT: 0 %
HCT: 30.4 % — ABNORMAL LOW (ref 35.0–47.0)
HEMOGLOBIN: 10.1 g/dL — AB (ref 12.0–16.0)
LYMPHS ABS: 0.7 10*3/uL — AB (ref 1.0–3.6)
Lymphocytes Relative: 5 %
MCH: 28.5 pg (ref 26.0–34.0)
MCHC: 33.3 g/dL (ref 32.0–36.0)
MCV: 85.6 fL (ref 80.0–100.0)
Monocytes Absolute: 0.6 10*3/uL (ref 0.2–0.9)
Monocytes Relative: 4 %
NEUTROS PCT: 91 %
Neutro Abs: 13.3 10*3/uL — ABNORMAL HIGH (ref 1.4–6.5)
PLATELETS: 362 10*3/uL (ref 150–440)
RBC: 3.54 MIL/uL — AB (ref 3.80–5.20)
RDW: 15.7 % — ABNORMAL HIGH (ref 11.5–14.5)
WBC: 14.6 10*3/uL — AB (ref 3.6–11.0)

## 2016-09-14 ENCOUNTER — Encounter: Payer: Self-pay | Admitting: Gerontology

## 2016-09-14 ENCOUNTER — Encounter: Payer: Self-pay | Admitting: Gastroenterology

## 2016-09-14 NOTE — Telephone Encounter (Signed)
error 

## 2016-09-14 NOTE — Progress Notes (Signed)
Location:   [facility] Room Number: [phone] Place of Service:  SNF [phone]) Provider:  Toni Arthurs, NP-C  Kirk Ruths, MD  Patient Care Team: Kirk Ruths, MD as PCP - General (Internal Medicine)  Extended Emergency Contact Information Primary Emergency Contact: Dondra Spry [place] Phone: [phone] Relation: Daughter  Code Status:  DNR Goals of care: Advanced Directive information Advanced Directives 09/11/2016  Does Patient Have a Medical Advance Directive? Yes  Type of Advance Directive Out of facility DNR (pink MOST or yellow form)  Does patient want to make changes to medical advance directive? No - Patient declined  Copy of [place] in Chart? -  Would patient like information on creating a medical advance directive? -     Chief Complaint  Patient presents with  . Acute Visit    Follow up on Nausea and Vomiting    HPI:  Pt is a 83 y.o. female seen today for an acute visit for weakness, malaise, nausea and vomiting. Pt reports she has been nauseated all day and has vomited once. She reports it was fluid/food, no bile or blood. Voiding adequately. Pt reports she normally has a BM at least once a day and she has not had one in several days at this point. Bowel sounds very sluggish, abdomen appears distended. Concern for ileus. Will obtain abdominal xrays to r/o ileus. All this explained to patient and daughter in law. They were agreeable to xrays and labs to r/o infection, dehydration, GI bleed, etc. Pt appeared more relaxed and less anxious after the visit. VSS. No other complaints.    Past Medical History:  Diagnosis Date  . Allergic rhinitis 11/18/2013  . Asthma with COPD (chronic obstructive pulmonary disease) ([place]) 11/18/2013   Last Assessment & Plan:  No flair ups noted recently and breathing is essentially at baseline.    . Asthma without status asthmaticus    unspecified  .  Borderline hypothyroidism 11/18/2013   Last Assessment & Plan:  Energy and tsh are doing well  . Cancer [facility])    colon cancer- surgically removed in 2006  . Cholelithiases   . Chronic kidney disease   . Colon cancer ([place])   . Complete left bundle branch block (LBBB) 11/18/2013  . Constipation 11/18/2013  . Diverticulosis   . Hiatal hernia    right  . HLD (hyperlipidemia) 11/18/2013   Last Assessment & Plan:  Appropriate diet is followed and no myalgia's are noted.    . Hyperlipidemia, unspecified   . Hypertension   . Hypertensive kidney disease with CKD stage III 11/18/2013   Last Assessment & Plan:  Is compliant with hypertensive medications without clear side effects or lack of control.  Nausea and itching are not symptomatic and nsaids are being avoided.    . Melanoma ([place])   . Osteopenia    Past Surgical History:  Procedure Laterality Date  . ABDOMINAL HYSTERECTOMY    . Bladder Lift    . BREAST EXCISIONAL BIOPSY Left 15+ yrs ago   neg  . COLON RESECTION  2006   colon cancer  . COLON SURGERY    . SKIN CANCER EXCISION  2006   melanoma    No Known Allergies  Allergies as of 09/11/2016   No Known Allergies     Medication List       Accurate as of 09/11/16 11:59 PM. Always use your most recent med list.  acetaminophen 500 MG tablet Commonly known as:  TYLENOL Take 500 mg by mouth every 6 (six) hours as needed.   ADVAIR DISKUS 250-50 MCG/DOSE Aepb Generic drug:  Fluticasone-Salmeterol Inhale 2 puffs into the lungs 2 (two) times daily.   bisacodyl 5 MG EC tablet Commonly known as:  DULCOLAX Take 5 mg by mouth daily as needed for moderate constipation.   bisacodyl 10 MG suppository Commonly known as:  DULCOLAX Place 10 mg rectally daily as needed.   fluticasone 50 MCG/ACT nasal spray Commonly known as:  FLONASE USE 2 SPRAYS IN EACH       NOSTRIL DAILY   losartan-hydrochlorothiazide 100-12.5 MG tablet Commonly known as:  HYZAAR TAKE 1 TABLET DAILY     magnesium hydroxide 400 MG/5ML suspension Commonly known as:  MILK OF MAGNESIA Take 30 mLs by mouth every 4 (four) hours as needed for mild constipation. Constipation/ no BM for 2 days   polyethylene glycol powder powder Commonly known as:  GLYCOLAX/MIRALAX Take 17 g by mouth every evening.   simvastatin 40 MG tablet Commonly known as:  ZOCOR TAKE 1 TABLET NIGHTLY FOR  CHOLESTEROL   VITAMIN D-1000 MAX ST 1000 units tablet Generic drug:  Cholecalciferol Take 1,000 Units by mouth daily.       Review of Systems  Constitutional: Positive for activity change and appetite change. Negative for chills, diaphoresis and fever.  HENT: Negative for congestion, sneezing, sore throat, trouble swallowing and voice change.   Respiratory: Negative for apnea, cough, choking, chest tightness, shortness of breath and wheezing.   Cardiovascular: Negative for chest pain, palpitations and leg swelling.  Gastrointestinal: Positive for abdominal distention, abdominal pain, constipation, nausea and vomiting. Negative for diarrhea.  Genitourinary: Negative for difficulty urinating, dysuria, frequency and urgency.  Musculoskeletal: Negative for back pain, gait problem and myalgias. Arthralgias: typical arthritis.  Skin: Negative for color change, pallor, rash and wound.  Neurological: Negative for dizziness, tremors, syncope, speech difficulty, weakness, numbness and headaches.  Psychiatric/Behavioral: Negative for agitation and behavioral problems.  All other systems reviewed and are negative.   Immunization History  Administered Date(s) Administered  . PPD Test 09/09/2016  . Pneumococcal-Unspecified 11/28/2008   Pertinent  Health Maintenance Due  Topic Date Due  . DEXA SCAN  11/28/1988  . PNA vac Low Risk Adult (1 of 2 - PCV13) 11/28/1988  . INFLUENZA VACCINE  09/30/2016   No flowsheet data found. Functional Status Survey:    Vitals:   09/11/16 1112  BP: (!) 142/75  Pulse: 68  Resp: 18   Temp: 97.7 F (36.5 C)  SpO2: 97%  Weight: 147 lb 14.9 oz (67.1 kg)  Height: 5' (1.524 m)   Body mass index is 28.89 kg/m. Physical Exam  Constitutional: She is oriented to person, place, and time. Vital signs are normal. She appears well-developed and well-nourished. She is active and cooperative. She appears ill. No distress.  HENT:  Head: Normocephalic and atraumatic.  Mouth/Throat: Uvula is midline, oropharynx is clear and moist and mucous membranes are normal. Mucous membranes are not pale, not dry and not cyanotic.  Eyes: Pupils are equal, round, and reactive to light. Conjunctivae, EOM and lids are normal.  Neck: Trachea normal, normal range of motion and full passive range of motion without pain. Neck supple. No JVD present. No tracheal deviation, no edema and no erythema present. No thyromegaly present.  Cardiovascular: Normal rate, regular rhythm, normal heart sounds, intact distal pulses and normal pulses.  Exam reveals no gallop, no distant heart sounds  and no friction rub.   No murmur heard. Pulmonary/Chest: Effort normal and breath sounds normal. No accessory muscle usage. No respiratory distress. She has no wheezes. She has no rales. She exhibits no tenderness.  Abdominal: Soft. Normal appearance. She exhibits distension (mild). She exhibits no shifting dullness, no fluid wave, no ascites and no mass. Bowel sounds are decreased. There is generalized tenderness.  Musculoskeletal: Normal range of motion. She exhibits no edema or tenderness.  Expected osteoarthritis, stiffness  Neurological: She is alert and oriented to person, place, and time. She has normal strength.  Skin: Skin is warm, dry and intact. She is not diaphoretic. No cyanosis. No pallor. Nails show no clubbing.  Psychiatric: She has a normal mood and affect. Her speech is normal and behavior is normal. Judgment and thought content normal. Cognition and memory are normal.  Nursing note and vitals  reviewed.   Labs reviewed:  Recent Labs  09/05/16 1814 09/06/16 0206 09/11/16 1218  NA 135 134* 134*  K 3.8 3.8 2.9*  CL 101 106 98*  CO2 27 25 27   GLUCOSE 108* 113* 113*  BUN 31* 28* 19  CREATININE 1.12* 0.92 0.86  CALCIUM 8.9 8.0* 8.5*    Recent Labs  09/05/16 1814 09/11/16 1218  AST 28 22  ALT 18 14  ALKPHOS 63 49  BILITOT 0.5 1.1  PROT 6.7 6.2*  ALBUMIN 3.9 3.6    Recent Labs  09/05/16 1814 09/06/16 0206 09/06/16 0708  09/06/16 2154  09/07/16 1947 09/08/16 0721 09/11/16 1218  WBC 12.9* 9.4  --   --   --   --   --   --  14.6*  NEUTROABS 10.6*  --   --   --   --   --   --   --  13.3*  HGB 11.5* 8.9* 8.2*  < > 8.5*  < > 8.4* 8.3* 10.1*  HCT 35.0 26.1* 23.8*  --  25.3*  --   --   --  30.4*  MCV 86.3 84.9  --   --   --   --   --   --  85.6  PLT 351 266  --   --   --   --   --   --  362  < > = values in this interval not displayed. No results found for: TSH No results found for: HGBA1C No results found for: CHOL, HDL, LDLCALC, LDLDIRECT, TRIG, CHOLHDL  Significant Diagnostic Results in last 30 days:  No results found.  Assessment/Plan 1. Constipation  Milk of Magnesia 30 mL po x 1 (not effective)  Give cup of 1/2 prune, 1/2 apple juices- very warm along with another dose of Milk Of Magnesia 30 mL   Bisacodyl suppository  Increase Miralax to BID  Encourage PO fluid intake  Zofran 4 mg po Q 6 hours prn nausea  2. Hypokalemia  Give 40 mEq Potassium chloride po TID x 1 day- starting at 1600  Give 1 time scheduled dose of Zofran at 1600 to prevent N/V of K+  Give Potassium Chloride 10 mEq Q day starting tomorrow- to go along with diuretic  Family/ staff Communication:   Total Time: 45 minutes  Documentation: 10 minutes  Face to Face: 35 minutes  Family/Phone:   Labs/tests ordered:  Cbc, met c, CXR, abdominal series xrays  Medication list reviewed and assessed for continued appropriateness.  Vikki Ports,  NP-C Geriatrics [facility] [phone],  [place] [address] Cell Phone (Mon-Fri 8am-5pm):  [phone] On Call:  [phone] & follow prompts after 5pm & weekends Office Phone:  [phone] Office Fax:  [phone]

## 2016-09-15 ENCOUNTER — Telehealth: Payer: Self-pay

## 2016-09-15 NOTE — Telephone Encounter (Signed)
Patients daughter Arville Go called to discuss  Some concerns she was having regarding her mother.  Her mother has been disoriented, confused and weak for the past few days.  Also experiencing bouts of nausea and diarrhea.  I advised Joann to discuss with her mothers nursing staff checking her mothers urine to see if she has a urinary tract infection which would explain the change in mental status.  I also informed her that the bouts of diarrhea can be occurring because of she is taking miralax and not eating enough fiber to bulk the stool.  I advised her to have the nursing staff give her more fiber in her meals.  She has an appt scheduled with [place] on 09/22/16.

## 2016-09-22 ENCOUNTER — Encounter: Payer: Self-pay | Admitting: Gastroenterology

## 2016-09-22 ENCOUNTER — Ambulatory Visit (INDEPENDENT_AMBULATORY_CARE_PROVIDER_SITE_OTHER): Payer: Medicare Other | Admitting: Gastroenterology

## 2016-09-22 VITALS — BP 116/72 | HR 72 | Temp 97.9°F | Ht 61.0 in | Wt 144.6 lb

## 2016-09-22 DIAGNOSIS — Z8719 Personal history of other diseases of the digestive system: Secondary | ICD-10-CM | POA: Diagnosis not present

## 2016-09-22 NOTE — Progress Notes (Signed)
Jonathon Bellows MD, MRCP(U.K) [address]  [place]  [place], [place] [address]  Main: [phone]  Fax: [phone]   Gastroenterology Consultation  Referring Provider:     Kirk Ruths, MD Primary Care Physician:  Kirk Ruths, MD Primary Gastroenterologist:  Dr. Jonathon Bellows  Reason for Consultation:     Hospital follow up         HPI:   Ramonita Koenig is a 83 y.o. y/o female referred for consultation & management  by Dr. Ouida Sills, Ocie Cornfield, MD.   Sh ewas admitted on 09/05/16 when two spots of blood were seen on the stool . Subsequently had 5 large bloody bowel movements , H/o rt hemicolectomy for adenocarcinoma in 2006 . Hb dropped to 8.2 grams . Her presentation was suspicious for a diverticular bleed. Because of her age we decided not to proceed with an endoscopy and she was discharged.   Interval history   09/07/2016-  09/22/2016    She has had some issues with constipation   She was confused a few days back , subsequently had a urine test and based on the result appears she had an UTI. No symptoms of dysuria or frequency , no confusion . May have resolved her UTI.   Says no issues with constipation presently. Taking miralax. No blood in stool. Brown stool.    Past Medical History:  Diagnosis Date  . Allergic rhinitis 11/18/2013  . Asthma with COPD (chronic obstructive pulmonary disease) ([place]) 11/18/2013   Last Assessment & Plan:  No flair ups noted recently and breathing is essentially at baseline.    . Asthma without status asthmaticus    unspecified  . Borderline hypothyroidism 11/18/2013   Last Assessment & Plan:  Energy and tsh are doing well  . Cancer [facility])    colon cancer- surgically removed in 2006  . Cholelithiases   . Chronic kidney disease   . Colon cancer ([place])   . Complete left bundle branch block (LBBB) 11/18/2013  . Constipation 11/18/2013  . Diverticulosis   . Hiatal hernia    right  . HLD (hyperlipidemia) 11/18/2013   Last Assessment &  Plan:  Appropriate diet is followed and no myalgia's are noted.    . Hyperlipidemia, unspecified   . Hypertension   . Hypertensive kidney disease with CKD stage III 11/18/2013   Last Assessment & Plan:  Is compliant with hypertensive medications without clear side effects or lack of control.  Nausea and itching are not symptomatic and nsaids are being avoided.    . Melanoma ([place])   . Osteopenia     Past Surgical History:  Procedure Laterality Date  . ABDOMINAL HYSTERECTOMY    . Bladder Lift    . BREAST EXCISIONAL BIOPSY Left 15+ yrs ago   neg  . COLON RESECTION  2006   colon cancer  . COLON SURGERY    . SKIN CANCER EXCISION  2006   melanoma    Prior to Admission medications   Medication Sig Start Date End Date Taking? Authorizing Provider  acetaminophen (TYLENOL) 500 MG tablet Take 500 mg by mouth every 6 (six) hours as needed.   Yes [provider]  bisacodyl (DULCOLAX) 10 MG suppository Place 10 mg rectally daily as needed.   Yes [provider]  Cholecalciferol (VITAMIN D-1000 MAX ST) 1000 units tablet Take 1,000 Units by mouth daily.  12/10/15  Yes [provider]  fluticasone (FLONASE) 50 MCG/ACT nasal spray USE 2 SPRAYS IN [facility]  NOSTRIL DAILY 11/19/15  Yes [provider]  Fluticasone-Salmeterol (ADVAIR DISKUS) 250-50 MCG/DOSE AEPB Inhale 2 puffs into the lungs 2 (two) times daily.  09/26/15  Yes [provider]  losartan-hydrochlorothiazide (HYZAAR) 100-12.5 MG tablet TAKE 1 TABLET DAILY 03/27/16  Yes [provider]  magnesium hydroxide (MILK OF MAGNESIA) 400 MG/5ML suspension Take 30 mLs by mouth every 4 (four) hours as needed for mild constipation. Constipation/ no BM for 2 days   Yes [provider]  polyethylene glycol powder (GLYCOLAX/MIRALAX) powder Take 17 g by mouth every evening.  02/13/16  Yes [provider]  simvastatin (ZOCOR) 40 MG tablet TAKE 1 TABLET NIGHTLY FOR  CHOLESTEROL 07/19/15  Yes  [provider]  potassium chloride (K-DUR,KLOR-CON) 10 MEQ tablet Take 10 mEq by mouth daily.    [provider]    Family History  Problem Relation Age of Onset  . Breast cancer Daughter 20       passed at 73 from breast cancer  . Heart disease Mother   . Heart failure Mother   . Lung cancer Father      Social History  Substance Use Topics  . Smoking status: Never Smoker  . Smokeless tobacco: Never Used  . Alcohol use No    Allergies as of 09/22/2016  . (No Known Allergies)    Review of Systems:    All systems reviewed and negative except where noted in HPI.   Physical Exam:  BP 116/72 (BP Location: Right Arm, Patient Position: Sitting, Cuff Size: Normal)   Pulse 72   Temp 97.9 F (36.6 C) (Oral)   Ht 5\' 1"  (1.549 m)   Wt 144 lb 9.6 oz (65.6 kg)   BMI 27.32 kg/m  No LMP recorded. Patient is postmenopausal. Psych:  Alert and cooperative. Normal mood and affect.Walks with walker General:   Alert,  Well-developed, well-nourished, pleasant and cooperative in NAD Head:  Normocephalic and atraumatic. Eyes:  Sclera clear, no icterus.   Conjunctiva pink. Ears:  Normal auditory acuity. Nose:  No deformity, discharge, or lesions. Mouth:  No deformity or lesions,oropharynx pink & moist. Neck:  Supple; no masses or thyromegaly. Lungs:  Respirations even and unlabored.  Clear throughout to auscultation.   No wheezes, crackles, or rhonchi. No acute distress. Heart:  Regular rate and rhythm; no murmurs, clicks, rubs, or gallops. Abdomen:  Normal bowel sounds.  No bruits.  Soft, non-tender and non-distended without masses, hepatosplenomegaly or hernias noted.  No guarding or rebound tenderness.    Neurologic:  Alert and oriented x3;  grossly normal neurologically. Skin:  Intact without significant lesions or rashes. No jaundice.brusing over arms Psych:  Alert and cooperative. Normal mood and affect.  Imaging Studies: No results found.  Assessment and Plan:     Beth Chen is a 83 y.o. y/o female is here to follow up for a recent hospitalization for a presumed diverticular bleed. No further evidence of bleeding . Recent urine analysis on 09/16/16 shows a possible UTI, seems her symptoms have since resolved without any antibiotics.   Plan  1. Monitor for bleeding with her primary care and if recurs to refer back  2. Daily miralax for constipation  3. Follow up with her primary for UTI- may be prudent to treat only if she has symptoms after repeat testing to avoid side effects from antibiotics at her age. I will defer decision to her primary care of th same 4. Copy of this office visit provided to the patient  Follow up as needed   Dr Jonathon Bellows MD,MRCP(U.K)

## 2017-04-14 ENCOUNTER — Emergency Department
Admission: EM | Admit: 2017-04-14 | Discharge: 2017-04-14 | Disposition: A | Discharge status: Home / Self Care | Payer: Medicare Other | Attending: Emergency Medicine | Admitting: Emergency Medicine

## 2017-04-14 ENCOUNTER — Encounter
Admission: RE | Admit: 2017-04-14 | Discharge: 2017-04-14 | Disposition: A | Discharge status: Home / Self Care | Payer: Medicare Other | Source: Ambulatory Visit | Attending: Internal Medicine | Admitting: Internal Medicine

## 2017-04-14 ENCOUNTER — Other Ambulatory Visit: Payer: Self-pay

## 2017-04-14 ENCOUNTER — Emergency Department: Payer: Medicare Other

## 2017-04-14 ENCOUNTER — Encounter: Payer: Self-pay | Admitting: Emergency Medicine

## 2017-04-14 DIAGNOSIS — J449 Chronic obstructive pulmonary disease, unspecified: Secondary | ICD-10-CM | POA: Insufficient documentation

## 2017-04-14 DIAGNOSIS — I447 Left bundle-branch block, unspecified: Secondary | ICD-10-CM | POA: Diagnosis not present

## 2017-04-14 DIAGNOSIS — R5383 Other fatigue: Secondary | ICD-10-CM | POA: Insufficient documentation

## 2017-04-14 DIAGNOSIS — Z79899 Other long term (current) drug therapy: Secondary | ICD-10-CM | POA: Insufficient documentation

## 2017-04-14 DIAGNOSIS — Y929 Unspecified place or not applicable: Secondary | ICD-10-CM | POA: Diagnosis not present

## 2017-04-14 DIAGNOSIS — W19XXXA Unspecified fall, initial encounter: Secondary | ICD-10-CM | POA: Insufficient documentation

## 2017-04-14 DIAGNOSIS — I129 Hypertensive chronic kidney disease with stage 1 through stage 4 chronic kidney disease, or unspecified chronic kidney disease: Secondary | ICD-10-CM | POA: Diagnosis not present

## 2017-04-14 DIAGNOSIS — S32010A Wedge compression fracture of first lumbar vertebra, initial encounter for closed fracture: Secondary | ICD-10-CM | POA: Diagnosis not present

## 2017-04-14 DIAGNOSIS — E785 Hyperlipidemia, unspecified: Secondary | ICD-10-CM | POA: Insufficient documentation

## 2017-04-14 DIAGNOSIS — N183 Chronic kidney disease, stage 3 (moderate): Secondary | ICD-10-CM | POA: Insufficient documentation

## 2017-04-14 DIAGNOSIS — M545 Low back pain, unspecified: Secondary | ICD-10-CM

## 2017-04-14 DIAGNOSIS — Y998 Other external cause status: Secondary | ICD-10-CM | POA: Insufficient documentation

## 2017-04-14 DIAGNOSIS — Y939 Activity, unspecified: Secondary | ICD-10-CM | POA: Insufficient documentation

## 2017-04-14 DIAGNOSIS — N390 Urinary tract infection, site not specified: Secondary | ICD-10-CM | POA: Diagnosis not present

## 2017-04-14 DIAGNOSIS — R531 Weakness: Secondary | ICD-10-CM | POA: Diagnosis present

## 2017-04-14 DIAGNOSIS — K5900 Constipation, unspecified: Secondary | ICD-10-CM | POA: Insufficient documentation

## 2017-04-14 LAB — CBC WITH DIFFERENTIAL/PLATELET
BASOS PCT: 1 %
Basophils Absolute: 0.2 10*3/uL — ABNORMAL HIGH (ref 0–0.1)
Eosinophils Absolute: 0.1 10*3/uL (ref 0–0.7)
Eosinophils Relative: 1 %
HCT: 34.5 % — ABNORMAL LOW (ref 35.0–47.0)
HEMOGLOBIN: 10.8 g/dL — AB (ref 12.0–16.0)
Lymphocytes Relative: 4 %
Lymphs Abs: 0.5 10*3/uL — ABNORMAL LOW (ref 1.0–3.6)
MCH: 22.9 pg — ABNORMAL LOW (ref 26.0–34.0)
MCHC: 31.4 g/dL — ABNORMAL LOW (ref 32.0–36.0)
MCV: 73 fL — ABNORMAL LOW (ref 80.0–100.0)
Monocytes Absolute: 0.9 10*3/uL (ref 0.2–0.9)
Monocytes Relative: 7 %
NEUTROS PCT: 87 %
Neutro Abs: 11.6 10*3/uL — ABNORMAL HIGH (ref 1.4–6.5)
Platelets: 371 10*3/uL (ref 150–440)
RBC: 4.72 MIL/uL (ref 3.80–5.20)
RDW: 18.4 % — ABNORMAL HIGH (ref 11.5–14.5)
WBC: 13.3 10*3/uL — AB (ref 3.6–11.0)

## 2017-04-14 LAB — URINALYSIS, COMPLETE (UACMP) WITH MICROSCOPIC
Bilirubin Urine: NEGATIVE
Glucose, UA: NEGATIVE mg/dL
HGB URINE DIPSTICK: NEGATIVE
Ketones, ur: NEGATIVE mg/dL
Nitrite: POSITIVE — AB
PROTEIN: NEGATIVE mg/dL
Specific Gravity, Urine: 1.011 (ref 1.005–1.030)
pH: 7 (ref 5.0–8.0)

## 2017-04-14 LAB — LIPASE, BLOOD: LIPASE: 24 U/L (ref 11–51)

## 2017-04-14 LAB — COMPREHENSIVE METABOLIC PANEL
ALBUMIN: 3.5 g/dL (ref 3.5–5.0)
ALK PHOS: 75 U/L (ref 38–126)
ALT: 10 U/L — AB (ref 14–54)
ANION GAP: 11 (ref 5–15)
AST: 25 U/L (ref 15–41)
BUN: 19 mg/dL (ref 6–20)
CALCIUM: 8.8 mg/dL — AB (ref 8.9–10.3)
CO2: 25 mmol/L (ref 22–32)
CREATININE: 1.01 mg/dL — AB (ref 0.44–1.00)
Chloride: 97 mmol/L — ABNORMAL LOW (ref 101–111)
GFR calc Af Amer: 54 mL/min — ABNORMAL LOW (ref >60)
GFR calc non Af Amer: 46 mL/min — ABNORMAL LOW (ref >60)
GLUCOSE: 110 mg/dL — AB (ref 65–99)
Potassium: 3.8 mmol/L (ref 3.5–5.1)
SODIUM: 133 mmol/L — AB (ref 135–145)
Total Bilirubin: 0.8 mg/dL (ref 0.3–1.2)
Total Protein: 6.5 g/dL (ref 6.5–8.1)

## 2017-04-14 MED ORDER — DOCUSATE SODIUM 100 MG PO CAPS: 100.0000 mg | ORAL_CAPSULE | Freq: Every day | ORAL | 0 refills | Status: DC | PRN

## 2017-04-14 MED ORDER — ACETAMINOPHEN 325 MG PO TABS
650.0000 mg | ORAL_TABLET | Freq: Once | ORAL | Status: AC
  Administered 2017-04-14: 650 mg via ORAL
  Filled 2017-04-14: qty 2

## 2017-04-14 MED ORDER — CEPHALEXIN 500 MG PO CAPS: 500.0000 mg | ORAL_CAPSULE | Freq: Three times a day (TID) | ORAL | 0 refills | Status: AC

## 2017-04-14 MED ORDER — CEFTRIAXONE SODIUM 1 G IJ SOLR
1.0000 g | Freq: Once | INTRAMUSCULAR | Status: AC
  Administered 2017-04-14: 1 g via INTRAVENOUS
  Filled 2017-04-14: qty 10

## 2017-04-14 NOTE — ED Notes (Signed)
Lab notified to add on Urine Culture. 

## 2017-04-14 NOTE — ED Notes (Signed)
Pt ambulated approximately 40 feet with walker without difficulty.  EDP aware.

## 2017-04-14 NOTE — ED Provider Notes (Addendum)
[facility] Emergency Department Provider Note  ____________________________________________   I have reviewed the triage vital signs and the nursing notes. Where available I have reviewed prior notes and, if possible and indicated, outside hospital notes.    HISTORY  Chief Complaint Back Pain    HPI Beth Chen is a 84 y.o. female who presents today complaining of feeling low back pain.  She denies dysuria urinary frequency she did fall several weeks ago.  She does not feel that that is impacting her pain.  She states she does have some degree of constipation which she denies any numbness or weakness.  She feels slightly generally weak but not focally weak.  She denies any incontinence of bowel or bladder.  She denies any headache or stiff neck.  The pain, she states she mostly has pain when she tries to get up otherwise she feels okay, at this time she is not complaining of this having an pain     Past Medical History:  Diagnosis Date  . Allergic rhinitis 11/18/2013  . Asthma with COPD (chronic obstructive pulmonary disease) ([place]) 11/18/2013   Last Assessment & Plan:  No flair ups noted recently and breathing is essentially at baseline.    . Asthma without status asthmaticus    unspecified  . Borderline hypothyroidism 11/18/2013   Last Assessment & Plan:  Energy and tsh are doing well  . Cancer [facility])    colon cancer- surgically removed in 2006  . Cholelithiases   . Chronic kidney disease   . Colon cancer ([place])   . Complete left bundle branch block (LBBB) 11/18/2013  . Constipation 11/18/2013  . Diverticulosis   . Hiatal hernia    right  . HLD (hyperlipidemia) 11/18/2013   Last Assessment & Plan:  Appropriate diet is followed and no myalgia's are noted.    . Hyperlipidemia, unspecified   . Hypertension   . Hypertensive kidney disease with CKD stage III ([place]) 11/18/2013   Last Assessment & Plan:  Is compliant with hypertensive medications without clear side  effects or lack of control.  Nausea and itching are not symptomatic and nsaids are being avoided.    . Melanoma ([place])   . Osteopenia     Patient Active Problem List   Diagnosis Date Noted  . Lower GI bleed 09/07/2016  . GI bleed 09/05/2016  . Allergic rhinitis 11/18/2013  . Asthma with COPD (chronic obstructive pulmonary disease) ([place]) 11/18/2013  . Borderline hypothyroidism 11/18/2013  . Complete left bundle branch block (LBBB) 11/18/2013  . Constipation 11/18/2013  . Hypertensive kidney disease with CKD stage III ([place]) 11/18/2013    Past Surgical History:  Procedure Laterality Date  . ABDOMINAL HYSTERECTOMY    . Bladder Lift    . BREAST EXCISIONAL BIOPSY Left 15+ yrs ago   neg  . COLON RESECTION  2006   colon cancer  . COLON SURGERY    . SKIN CANCER EXCISION  2006   melanoma    Prior to Admission medications   Medication Sig Start Date End Date Taking? Authorizing Provider  calcium carbonate (TUMS EX) 750 MG chewable tablet Chew 2 tablets by mouth as directed.   Yes [provider]  Cholecalciferol (VITAMIN D-1000 MAX ST) 1000 units tablet Take 1,000 Units by mouth daily.  12/10/15  Yes [provider]  fluticasone (FLONASE) 50 MCG/ACT nasal spray USE 2 SPRAYS IN EACH       NOSTRIL DAILY 11/19/15  Yes [provider]  losartan-hydrochlorothiazide (HYZAAR) 100-12.5  MG tablet TAKE 1 TABLET DAILY 03/27/16  Yes [provider]  simvastatin (ZOCOR) 40 MG tablet TAKE 1 TABLET NIGHTLY FOR  CHOLESTEROL 07/19/15  Yes [provider]    Allergies Patient has no known allergies.  Family History  Problem Relation Age of Onset  . Breast cancer Daughter 31       passed at 63 from breast cancer  . Heart disease Mother   . Heart failure Mother   . Lung cancer Father     Social History Social History   Tobacco Use  . Smoking status: Never Smoker  . Smokeless tobacco: Never Used  Substance Use Topics  . Alcohol use: No  . Drug use:  Not on file    Review of Systems Constitutional: No fever/chills Eyes: No visual changes. ENT: No sore throat. No stiff neck no neck pain Cardiovascular: Denies chest pain. Respiratory: Denies shortness of breath. Gastrointestinal:   no vomiting.  No diarrhea.  No constipation. Genitourinary: Negative for dysuria. Musculoskeletal: Negative lower extremity swelling Skin: Negative for rash. Neurological: Negative for severe headaches, focal weakness or numbness.   ____________________________________________   PHYSICAL EXAM:  VITAL SIGNS: ED Triage Vitals [04/14/17 0749]  Enc Vitals Group     BP (!) 176/98     Pulse Rate 70     Resp 20     Temp 97.6 F (36.4 C)     Temp Source Oral     SpO2 94 %     Weight      Height      Head Circumference      Peak Flow      Pain Score 5     Pain Loc      Pain Edu?      Excl. in [place]?     Constitutional: Alert and oriented. Well appearing and in no acute distress. Eyes: Conjunctivae are normal Head: Atraumatic HEENT: No congestion/rhinnorhea. Mucous membranes are moist.  Oropharynx non-erythematous Neck:   Nontender with no meningismus, no masses, no stridor Cardiovascular: Normal rate, regular rhythm. Grossly normal heart sounds.  Good peripheral circulation. Respiratory: Normal respiratory effort.  No retractions. Lungs CTAB. Abdominal: Soft and nontender. No distention. No guarding no rebound Back:  T he really is not much tenderness to palpation in the spine itself she does have some low back discomfort the paraspinal regions around L4-L5.  Lesions noted.  There is no midline tenderness there are no lesions noted. there is no CVA tenderness Musculoskeletal: No lower extremity tenderness, no upper extremity tenderness. No joint effusions, no DVT signs strong distal pulses no edema Neurologic:  Normal speech and language. No gross focal neurologic deficits are appreciated.  Saddle anesthesia, Skin:  Skin is warm, dry and intact.  No rash noted. Psychiatric: Mood and affect are normal. Speech and behavior are normal.  ____________________________________________   LABS (all labs ordered are listed, but only abnormal results are displayed)  Labs Reviewed  CBC WITH DIFFERENTIAL/PLATELET - Abnormal; Notable for the following components:      Result Value   WBC 13.3 (*)    Hemoglobin 10.8 (*)    HCT 34.5 (*)    MCV 73.0 (*)    MCH 22.9 (*)    MCHC 31.4 (*)    RDW 18.4 (*)    Neutro Abs 11.6 (*)    Lymphs Abs 0.5 (*)    Basophils Absolute 0.2 (*)    All other components within normal limits  COMPREHENSIVE METABOLIC PANEL - Abnormal; Notable  for the following components:   Sodium 133 (*)    Chloride 97 (*)    Glucose, Bld 110 (*)    Creatinine, Ser 1.01 (*)    Calcium 8.8 (*)    ALT 10 (*)    GFR calc non Af Amer 46 (*)    GFR calc Af Amer 54 (*)    All other components within normal limits  URINALYSIS, COMPLETE (UACMP) WITH MICROSCOPIC - Abnormal; Notable for the following components:   Color, Urine YELLOW (*)    APPearance CLOUDY (*)    Nitrite POSITIVE (*)    Leukocytes, UA LARGE (*)    Bacteria, UA FEW (*)    Squamous Epithelial / LPF 0-5 (*)    Crystals PRESENT (*)    All other components within normal limits  URINE CULTURE  LIPASE, BLOOD    Pertinent labs  results that were available during my care of the patient were reviewed by me and considered in my medical decision making (see chart for details). ____________________________________________  EKG  I personally interpreted any EKGs ordered by me or triage  ____________________________________________  RADIOLOGY  Pertinent labs & imaging results that were available during my care of the patient were reviewed by me and considered in my medical decision making (see chart for details). If possible, patient and/or family made aware of any abnormal findings.  Dg Lumbar Spine Complete  Result Date: 04/14/2017 CLINICAL DATA:  Low back  pain for the past week. Fall a few weeks ago. EXAM: LUMBAR SPINE - COMPLETE 4+ VIEW COMPARISON:  CT lumbar spine dated April 17, 2014. FINDINGS: Unchanged lumbarization of S1. Questionable irregularity along the superior endplate of L1 with minimal height loss. Remaining vertebral body heights are preserved. Unchanged trace retrolisthesis at L1-L2. Unchanged grade 1 anterolisthesis at L4-L5 and L5-S1. Mild disc height loss at L4-L5 and L5-S1, not significantly changed. Severe bilateral facet arthropathy at L4-L5 and L5-S1. Osteopenia. Aortoiliac atherosclerotic vascular disease. IMPRESSION: 1. Questionable irregularity along the superior endplate of L1 with minimal height loss, possibly representing a very mild compression fracture. Correlate with point tenderness and consider CT for further evaluation. 2. Relatively unchanged mild degenerative disc disease and severe facet arthropathy at L4-L5 and L5-S1, with resultant grade 1 anterolisthesis at both levels. Electronically Signed   By: Titus Dubin M.D.   On: 04/14/2017 09:03   Dg Abdomen Acute W/chest  Result Date: 04/14/2017 CLINICAL DATA:  Weakness and constipation EXAM: DG ABDOMEN ACUTE W/ 1V CHEST COMPARISON:  Abdominal radiograph September 21, 2011; CT abdomen and pelvis September 19, 2011 FINDINGS: PA chest: No edema or consolidation. Heart is mildly enlarged with pulmonary vascularity within normal limits. There is aortic atherosclerosis. There is an old healed fracture of the right posterior sixth rib. Supine and upright abdomen: There is fairly diffuse stool throughout the colon. There is no bowel dilatation or air-fluid level to suggest bowel obstruction. No free air. IMPRESSION: Fairly diffuse stool throughout colon. No bowel obstruction or free air. No edema or consolidation. Mild cardiomegaly. Aortic atherosclerosis. Aortic Atherosclerosis (ICD10-I70.0). Electronically Signed   By: Lowella Grip III M.D.   On: 04/14/2017 08:54   Ct Renal Stone  Study  Result Date: 04/14/2017 CLINICAL DATA:  Low back pain for 1 week. The patient reports a fall 2-3 weeks ago. EXAM: CT ABDOMEN AND PELVIS WITHOUT CONTRAST TECHNIQUE: Multidetector CT imaging of the abdomen and pelvis was performed following the standard protocol without IV contrast. COMPARISON:  Plain films lumbar spine 04/14/2017. CT lumbar  spine 04/17/2014. FINDINGS: Lower chest: There is some linear scar in the lung bases. Lung bases are otherwise clear. No pleural or pericardial effusion. Heart size is mildly enlarged. Hepatobiliary: Multiple gravel type stones are seen layering dependently within the gallbladder. Stones have increased in number since the prior CT. No evidence of cholecystitis. The liver and biliary tree are unremarkable. Pancreas: Unremarkable. No pancreatic ductal dilatation or surrounding inflammatory changes. Spleen: Normal in size without focal abnormality. Adrenals/Urinary Tract: Adrenal glands are unremarkable. Kidneys are normal, without renal calculi, focal lesion, or hydronephrosis. Bladder is unremarkable. Stomach/Bowel: Moderate hiatal hernia is seen. The stomach is otherwise unremarkable. Diverticulosis without diverticulitis is worst in the sigmoid. The patient is status post right hemicolectomy. Moderate stool burden in the ascending and transverse colon is identified. Small bowel appears normal. Vascular/Lymphatic: Aortic atherosclerosis. No enlarged abdominal or pelvic lymph nodes. Reproductive: Status post hysterectomy. No adnexal masses. Other: A large hernia in the lateral aspect of the anterior abdominal wall most consistent with an incisional hernia contains loops of bowel without obstruction other complicating feature. The hernia is unchanged. No ascites. Musculoskeletal: Transitional lumbosacral anatomy is seen. Numbering scheme is same as that used on prior plain films lumbar spine. The patient has a mild superior endplate compression fracture of L1 with vertebral  body height loss of 10-15%. There is slight bony retropulsion off the superior endplate which does not central canal stenosis. Fracture margins are sharp consistent with acute or subacute injury. The patient's fracture does not involve the posterior elements. The patient appears to have a large broad-based disc protrusion at T12-L1. No other focal bony abnormality is identified. Facet arthropathy resulting in 0.8 cm anterolisthesis L5 on S1 noted. IMPRESSION: Acute or subacute superior endplate compression fracture with vertebral body height loss of 10-15% does not involve the posterior elements. There is minimal bony retropulsion off the superior endplate. The patient appears to have a large disc herniation at T12-L1. MRI of the lumbar spine without contrast could be used for further evaluation. Moderate hiatal hernia. Gallstones without cholecystitis. Right lateral incisional hernia containing loops of bowel without obstruction other complicating feature. The hernia is unchanged. Atherosclerosis. Electronically Signed   By: Inge Rise M.D.   On: 04/14/2017 09:59   ____________________________________________    PROCEDURES  Procedure(s) performed: None  Procedures  Critical Care performed: None  ____________________________________________   INITIAL IMPRESSION / ASSESSMENT AND PLAN / ED COURSE  Pertinent labs & imaging results that were available during my care of the patient were reviewed by me and considered in my medical decision making (see chart for details).  Patient here with multiple complaints, she has low back pain for about a week but she fell 3 weeks ago.  CT scan I did shows that she has a lumbar fracture although it is unclear if this is related to any of her pain today.  She does not really have most of her pain in that area is mostly lower.  For this reason I also did a urinalysis, urinalysis was nitrite positive UA which I think is most likely why she has this discomfort  to be honest.  Or perhaps is a combination of the 2.  She is neurologically intact at this time there does not appear to be any evidence of cauda equina syndrome she likely will require an outpatient MRI.  The patient's blood work and vital signs are reassuring she is afebrile, we will give her IV Rocephin nonetheless to see if we can assure ourselves of  some degree of infection control.  It is my hope that we can get her home given our findings here in the high predominance of flu in the hospital.  ----------------------------------------- 2:07 PM on 04/14/2017 -----------------------------------------  Patient in no acute distress we will see if she can ambulate, she can we will refer to neurosurgery for her slight compression fracture and disc issues, as well as her primary care doctor for urinary tract infection.  Vital signs are stable, well-appearing at this time, is my hope that we can get her safely home if she can ambulate at baseline.  ----------------------------------------- 2:09 PM on 04/14/2017 -----------------------------------------  Walks with no evidence of discomfort or pain, she is neurologically intact, we will discharge her with close outpatient follow-up I did discuss with patient and family admission to the hospital but they both prefer to go home especially given the high incidence of influenza in the hospital setting.    ____________________________________________   FINAL CLINICAL IMPRESSION(S) / ED DIAGNOSES  Final diagnoses:  None      This chart was dictated using voice recognition software.  Despite best efforts to proofread,  errors can occur which can change meaning.      Schuyler Amor, MD 04/14/17 1223    Schuyler Amor, MD 04/14/17 1407    Schuyler Amor, MD 04/14/17 1409

## 2017-04-14 NOTE — ED Triage Notes (Signed)
Low back pain for a week. Dropped off from [place]. Pt states she fell 2-3 weeks ago.

## 2017-04-14 NOTE — Discharge Instructions (Signed)
Take Tylenol for the pain return to the emergency room for new or worrisome symptoms including increased pain, fever, vomiting, weakness, numbness, incontinence of bowel or bladder or any other concerns.  Follow closely for your back with your primary care doctor, also please call your doctor in the time that you have a urinary tract infection.  If you feel worse please return.

## 2017-04-14 NOTE — ED Notes (Signed)
RN spoke to [facility] and gave report. Confirmed the room is ready. RN assisted to load pt into car and family has agreed to transport.

## 2017-04-14 NOTE — ED Notes (Signed)
Pt accepted to [facility] to room 348 per SW. Call report to [phone]

## 2017-04-14 NOTE — ED Notes (Signed)
Patient transported to X-ray 

## 2017-04-14 NOTE — NC FL2 (Signed)
  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 Lower GI bleed 09/07/2016  . GI bleed 09/05/2016  . Allergic rhinitis 11/18/2013  . Asthma with COPD (chronic obstructive pulmonary disease) ([place]) 11/18/2013  . Borderline hypothyroidism 11/18/2013  . Complete left bundle branch block (LBBB) 11/18/2013  . Constipation 11/18/2013  . Hypertensive kidney disease with CKD stage III ([place]) 11/18/2013    Orientation RESPIRATION BLADDER Height & Weight     Self, Place, Situation, Time  Normal Continent Weight:   Height:     BEHAVIORAL SYMPTOMS/MOOD NEUROLOGICAL BOWEL NUTRITION STATUS  (none) (none) Continent Diet  AMBULATORY STATUS COMMUNICATION OF NEEDS Skin   Limited Assist Verbally Normal                       Personal Care Assistance Level of Assistance  Bathing, Dressing Bathing Assistance: Limited assistance   Dressing Assistance: Limited assistance     Functional Limitations Info             SPECIAL CARE FACTORS FREQUENCY  PT (By licensed PT)                    Contractures Contractures Info: Not present    Additional Factors Info  Code Status Code Status Info: dnr             Current Medications (04/14/2017):  This is the current hospital active medication list No current  facility-administered medications for this encounter.    Current Outpatient Medications  Medication Sig Dispense Refill  . calcium carbonate (TUMS EX) 750 MG chewable tablet Chew 2 tablets by mouth as directed.    . Cholecalciferol (VITAMIN D-1000 MAX ST) 1000 units tablet Take 1,000 Units by mouth daily.     . fluticasone (FLONASE) 50 MCG/ACT nasal spray USE 2 SPRAYS IN EACH       NOSTRIL DAILY    . losartan-hydrochlorothiazide (HYZAAR) 100-12.5 MG tablet TAKE 1 TABLET DAILY    . simvastatin (ZOCOR) 40 MG tablet TAKE 1 TABLET NIGHTLY FOR  CHOLESTEROL    . cephALEXin (KEFLEX) 500 MG capsule Take 1 capsule (500 mg total) by mouth 3 (three) times daily for 10 days. 30 capsule 0  . docusate sodium (COLACE) 100 MG capsule Take 1 capsule (100 mg total) by mouth daily as needed. 30 capsule 0     Discharge Medications: Please see discharge summary for a list of discharge medications.  Relevant Imaging Results:  Relevant Lab Results:   Additional Information [id number]  Shela Leff, LCSW

## 2017-04-16 LAB — URINE CULTURE

## 2017-04-20 ENCOUNTER — Other Ambulatory Visit
Admission: RE | Admit: 2017-04-20 | Discharge: 2017-04-20 | Disposition: A | Discharge status: Home / Self Care | Payer: Medicare Other | Source: Skilled Nursing Facility | Attending: Gerontology | Admitting: Gerontology

## 2017-04-20 DIAGNOSIS — R531 Weakness: Secondary | ICD-10-CM | POA: Diagnosis present

## 2017-04-20 DIAGNOSIS — R5383 Other fatigue: Secondary | ICD-10-CM | POA: Diagnosis not present

## 2017-04-20 LAB — CBC WITH DIFFERENTIAL/PLATELET
BASOS ABS: 0 10*3/uL (ref 0–0.1)
BASOS PCT: 0 %
Eosinophils Absolute: 0 10*3/uL (ref 0–0.7)
Eosinophils Relative: 0 %
HEMATOCRIT: 32.7 % — AB (ref 35.0–47.0)
HEMOGLOBIN: 10.8 g/dL — AB (ref 12.0–16.0)
LYMPHS PCT: 4 %
Lymphs Abs: 0.7 10*3/uL — ABNORMAL LOW (ref 1.0–3.6)
MCH: 24.1 pg — ABNORMAL LOW (ref 26.0–34.0)
MCHC: 33.2 g/dL (ref 32.0–36.0)
MCV: 72.6 fL — AB (ref 80.0–100.0)
Monocytes Absolute: 1.2 10*3/uL — ABNORMAL HIGH (ref 0.2–0.9)
Monocytes Relative: 6 %
NEUTROS ABS: 17.2 10*3/uL — AB (ref 1.4–6.5)
NEUTROS PCT: 90 %
Platelets: 485 10*3/uL — ABNORMAL HIGH (ref 150–440)
RBC: 4.5 MIL/uL (ref 3.80–5.20)
RDW: 18.3 % — ABNORMAL HIGH (ref 11.5–14.5)
WBC: 19.1 10*3/uL — ABNORMAL HIGH (ref 3.6–11.0)

## 2017-04-20 LAB — COMPREHENSIVE METABOLIC PANEL
ALT: 11 U/L — ABNORMAL LOW (ref 14–54)
ANION GAP: 11 (ref 5–15)
AST: 21 U/L (ref 15–41)
Albumin: 3.6 g/dL (ref 3.5–5.0)
Alkaline Phosphatase: 69 U/L (ref 38–126)
BILIRUBIN TOTAL: 0.6 mg/dL (ref 0.3–1.2)
BUN: 39 mg/dL — ABNORMAL HIGH (ref 6–20)
CO2: 29 mmol/L (ref 22–32)
Calcium: 10 mg/dL (ref 8.9–10.3)
Chloride: 93 mmol/L — ABNORMAL LOW (ref 101–111)
Creatinine, Ser: 0.93 mg/dL (ref 0.44–1.00)
GFR, EST AFRICAN AMERICAN: 60 mL/min — AB (ref >60)
GFR, EST NON AFRICAN AMERICAN: 51 mL/min — AB (ref >60)
Glucose, Bld: 116 mg/dL — ABNORMAL HIGH (ref 65–99)
POTASSIUM: 3.6 mmol/L (ref 3.5–5.1)
Sodium: 133 mmol/L — ABNORMAL LOW (ref 135–145)
Total Protein: 6.7 g/dL (ref 6.5–8.1)

## 2017-04-20 LAB — MAGNESIUM: Magnesium: 1.8 mg/dL (ref 1.7–2.4)

## 2017-04-20 LAB — FOLATE: FOLATE: 13 ng/mL (ref >5.9)

## 2017-04-20 LAB — LIPID PANEL
Cholesterol: 127 mg/dL (ref 0–200)
HDL: 74 mg/dL (ref >40)
LDL CALC: 40 mg/dL (ref 0–99)
TRIGLYCERIDES: 66 mg/dL (ref <150)
Total CHOL/HDL Ratio: 1.7 RATIO
VLDL: 13 mg/dL (ref 0–40)

## 2017-04-20 LAB — TSH: TSH: 3.269 u[IU]/mL (ref 0.350–4.500)

## 2017-04-20 LAB — VITAMIN B12: VITAMIN B 12: 323 pg/mL (ref 180–914)

## 2017-04-21 DIAGNOSIS — R5383 Other fatigue: Secondary | ICD-10-CM | POA: Diagnosis not present

## 2017-04-21 DIAGNOSIS — R531 Weakness: Secondary | ICD-10-CM | POA: Diagnosis present

## 2017-04-21 LAB — URINALYSIS, COMPLETE (UACMP) WITH MICROSCOPIC
Bilirubin Urine: NEGATIVE
GLUCOSE, UA: NEGATIVE mg/dL
Hgb urine dipstick: NEGATIVE
KETONES UR: NEGATIVE mg/dL
Leukocytes, UA: NEGATIVE
Nitrite: NEGATIVE
PH: 5 (ref 5.0–8.0)
Protein, ur: NEGATIVE mg/dL
SPECIFIC GRAVITY, URINE: 1.024 (ref 1.005–1.030)

## 2017-04-21 LAB — VITAMIN D 25 HYDROXY (VIT D DEFICIENCY, FRACTURES): VIT D 25 HYDROXY: 41.9 ng/mL (ref 30.0–100.0)

## 2017-04-22 ENCOUNTER — Encounter: Payer: Self-pay | Admitting: Gerontology

## 2017-04-22 ENCOUNTER — Non-Acute Institutional Stay (SKILLED_NURSING_FACILITY): Payer: Medicare Other | Admitting: Gerontology

## 2017-04-22 DIAGNOSIS — N3000 Acute cystitis without hematuria: Secondary | ICD-10-CM

## 2017-04-22 DIAGNOSIS — E871 Hypo-osmolality and hyponatremia: Secondary | ICD-10-CM | POA: Diagnosis not present

## 2017-04-22 DIAGNOSIS — R5383 Other fatigue: Secondary | ICD-10-CM

## 2017-04-22 DIAGNOSIS — E538 Deficiency of other specified B group vitamins: Secondary | ICD-10-CM

## 2017-04-22 LAB — URINALYSIS, COMPLETE (UACMP) WITH MICROSCOPIC
BILIRUBIN URINE: NEGATIVE
Bacteria, UA: NONE SEEN
Glucose, UA: NEGATIVE mg/dL
HGB URINE DIPSTICK: NEGATIVE
KETONES UR: NEGATIVE mg/dL
LEUKOCYTES UA: NEGATIVE
Nitrite: NEGATIVE
Protein, ur: NEGATIVE mg/dL
SPECIFIC GRAVITY, URINE: 1.024 (ref 1.005–1.030)
pH: 5 (ref 5.0–8.0)

## 2017-04-22 NOTE — Progress Notes (Signed)
Location:   [place] Room Number: [place]:  SNF [phone]) Provider:  Toni Arthurs, NP-C  Kirk Ruths, MD  Patient Care Team: Kirk Ruths, MD as PCP - General (Internal Medicine)  Extended Emergency Contact Information Primary Emergency Contact: Dondra Spry [place] Phone: [phone] Mobile Phone: [phone] Relation: Daughter Secondary Emergency Contact: Costella Hatcher [place] Phone: [phone] Relation: Son  Code Status:  DNR Goals of care: Advanced Directive information Advanced Directives 04/22/2017  Does Patient Have a Medical Advance Directive? Yes  Type of Advance Directive Living will;Healthcare Power of Attorney  Does patient want to make changes to medical advance directive? No - Patient declined  Copy of [place] in Chart? Yes  Would patient like information on creating a medical advance directive? -     Chief Complaint  Patient presents with  . Medical Management of Chronic Issues    Routine Visit    HPI:  Beth Chen is a 84 y.o. female seen today for an acute visit for lethargy, AMS. Family concerned about Beth Chen's lack of energy, increased sleepiness. Beth Chen denies n/v/d/f/c/cp/sob/ha/abd pain/dizziness/cough/congestion. Beth Chen does have intermittent non-productive cough.Beth Chen reports she generally doesn't feel well, feels weak. VSS. No other complaints.     Past Medical History:  Diagnosis Date  . Allergic rhinitis 11/18/2013  . Asthma with COPD (chronic obstructive pulmonary disease) ([place]) 11/18/2013   Last Assessment & Plan:  No flair ups noted recently and breathing is essentially at baseline.    . Asthma without status asthmaticus    unspecified  . Borderline hypothyroidism 11/18/2013   Last Assessment & Plan:  Energy and tsh are doing well  . Cancer [facility])    colon cancer- surgically removed in 2006  . Cholelithiases   . Chronic kidney disease     . Colon cancer ([place])   . Complete left bundle branch block (LBBB) 11/18/2013  . Constipation 11/18/2013  . Diverticulosis   . Hiatal hernia    right  . HLD (hyperlipidemia) 11/18/2013   Last Assessment & Plan:  Appropriate diet is followed and no myalgia's are noted.    . Hyperlipidemia, unspecified   . Hypertension   . Hypertensive kidney disease with CKD stage III ([place]) 11/18/2013   Last Assessment & Plan:  Is compliant with hypertensive medications without clear side effects or lack of control.  Nausea and itching are not symptomatic and nsaids are being avoided.    . Melanoma ([place])   . Osteopenia    Past Surgical History:  Procedure Laterality Date  . ABDOMINAL HYSTERECTOMY    . Bladder Lift    . BREAST EXCISIONAL BIOPSY Left 15+ yrs ago   neg  . COLON RESECTION  2006   colon cancer  . COLON SURGERY    . COLON SURGERY Right   . SKIN CANCER EXCISION  2006   melanoma    No Known Allergies  Allergies as of 04/22/2017   No Known Allergies     Medication List        Accurate as of 04/22/17 11:31 AM. Always use your most recent med list.          acetaminophen 325 MG tablet Commonly known as:  TYLENOL Take 650 mg by mouth every 4 (four) hours as needed. for pain/ increased temp. May be administered orally, per G-tube if needed or rectally if unable to swallow (separate order). Maximum dose for 24 hours is 3,000  mg from all sources of Acetaminophen/ Tylenol   aspirin EC 81 MG tablet Take 81 mg by mouth at bedtime. Heart Health- per home regimen   bisacodyl 10 MG suppository Commonly known as:  DULCOLAX Place 10 mg rectally 2 (two) times daily as needed.   calcium carbonate 750 MG chewable tablet Commonly known as:  TUMS EX Chew 2 tablets by mouth daily.   cephALEXin 500 MG capsule Commonly known as:  KEFLEX Take 1 capsule (500 mg total) by mouth 3 (three) times daily for 10 days.   docusate sodium 100 MG capsule Commonly known as:  COLACE Take 1 capsule (100  mg total) by mouth daily as needed.   fluticasone 50 MCG/ACT nasal spray Commonly known as:  FLONASE USE 2 SPRAYS IN EACH       NOSTRIL DAILY   lactulose 10 GM/15ML solution Commonly known as:  CHRONULAC Take 30 g by mouth 2 (two) times daily as needed.   losartan-hydrochlorothiazide 100-12.5 MG tablet Commonly known as:  HYZAAR TAKE 1 TABLET DAILY   polyethylene glycol packet Commonly known as:  MIRALAX / GLYCOLAX Take 17 g by mouth daily.   VITAMIN D-1000 MAX ST 1000 units tablet Generic drug:  Cholecalciferol Take 2,000 Units by mouth daily. 2 tabs       Review of Systems  Constitutional: Positive for activity change and appetite change. Negative for chills, diaphoresis and fever.  HENT: Negative for congestion, mouth sores, nosebleeds, postnasal drip, sneezing, sore throat, trouble swallowing and voice change.   Respiratory: Negative for apnea, cough, choking, chest tightness, shortness of breath and wheezing.   Cardiovascular: Negative for chest pain, palpitations and leg swelling.  Gastrointestinal: Negative for abdominal distention, abdominal pain, constipation, diarrhea and nausea.  Genitourinary: Negative for difficulty urinating, dysuria, frequency and urgency.  Musculoskeletal: Positive for arthralgias (typical arthritis). Negative for back pain, gait problem and myalgias.  Skin: Positive for pallor. Negative for color change, rash and wound.  Neurological: Negative for dizziness, tremors, syncope, speech difficulty, weakness, numbness and headaches.  Psychiatric/Behavioral: Negative for agitation and behavioral problems.  All other systems reviewed and are negative.   Immunization History  Administered Date(s) Administered  . PPD Test 09/09/2016  . Pneumococcal Conjugate-13 12/16/2016  . Pneumococcal-Unspecified 11/28/2008   Pertinent  Health Maintenance Due  Topic Date Due  . DEXA SCAN  11/28/1988  . PNA vac Low Risk Adult (2 of 2 - PCV13) 11/28/2009  .  INFLUENZA VACCINE  09/30/2016   No flowsheet data found. Functional Status Survey:    Vitals:   04/22/17 1113  BP: 129/65  Pulse: 70  Resp: 18  Temp: (!) 97.5 F (36.4 C)  TempSrc: Oral  SpO2: 96%  Weight: 138 lb 6.4 oz (62.8 kg)  Height: 5' 1"  (1.549 m)   Body mass index is 26.15 kg/m. Physical Exam  Constitutional: She is oriented to person, place, and time. Vital signs are normal. She appears well-developed and well-nourished. She is active and cooperative. She does not appear ill. No distress.  HENT:  Head: Normocephalic and atraumatic.  Mouth/Throat: Uvula is midline, oropharynx is clear and moist and mucous membranes are normal. Mucous membranes are not pale, not dry and not cyanotic.  Eyes: Pupils are equal, round, and reactive to light. Conjunctivae, EOM and lids are normal.  Neck: Trachea normal, normal range of motion and full passive range of motion without pain. Neck supple. No JVD present. No tracheal deviation, no edema and no erythema present. No thyromegaly present.  Cardiovascular:  Normal rate, regular rhythm, intact distal pulses and normal pulses. Exam reveals no gallop, no distant heart sounds and no friction rub.  Murmur heard. Pulses:      Dorsalis pedis pulses are 2+ on the right side, and 2+ on the left side.  No edema  Pulmonary/Chest: Effort normal and breath sounds normal. No accessory muscle usage. No respiratory distress. She has no decreased breath sounds. She has no wheezes. She has no rhonchi. She has no rales. She exhibits no tenderness.  Abdominal: Soft. Normal appearance and bowel sounds are normal. She exhibits no distension and no ascites. There is no tenderness.  Musculoskeletal: Normal range of motion. She exhibits no edema or tenderness.  Expected osteoarthritis, stiffness; Bilateral Calves soft, supple. Negative Homan's Sign. B- pedal pulses equal  Neurological: She is alert and oriented to person, place, and time. She has normal strength.  She displays atrophy. She exhibits abnormal muscle tone. Coordination and gait abnormal.  Skin: Skin is warm, dry and intact. She is not diaphoretic. No cyanosis. No pallor. Nails show no clubbing.  Psychiatric: She has a normal mood and affect. Her speech is normal and behavior is normal. Judgment and thought content normal. Cognition and memory are impaired. She exhibits abnormal recent memory.  Nursing note and vitals reviewed.   Labs reviewed: Recent Labs    09/11/16 1218 04/14/17 0936 04/20/17 1505  NA 134* 133* 133*  K 2.9* 3.8 3.6  CL 98* 97* 93*  CO2 27 25 29   GLUCOSE 113* 110* 116*  BUN 19 19 39*  CREATININE 0.86 1.01* 0.93  CALCIUM 8.5* 8.8* 10.0  MG  --   --  1.8   Recent Labs    09/11/16 1218 04/14/17 0936 04/20/17 1505  AST 22 25 21   ALT 14 10* 11*  ALKPHOS 49 75 69  BILITOT 1.1 0.8 0.6  PROT 6.2* 6.5 6.7  ALBUMIN 3.6 3.5 3.6   Recent Labs    09/11/16 1218 04/14/17 0936 04/20/17 1505  WBC 14.6* 13.3* 19.1*  NEUTROABS 13.3* 11.6* 17.2*  HGB 10.1* 10.8* 10.8*  HCT 30.4* 34.5* 32.7*  MCV 85.6 73.0* 72.6*  PLT 362 371 485*   Lab Results  Component Value Date   TSH 3.269 04/20/2017   No results found for: HGBA1C Lab Results  Component Value Date   CHOL 127 04/20/2017   HDL 74 04/20/2017   LDLCALC 40 04/20/2017   TRIG 66 04/20/2017   CHOLHDL 1.7 04/20/2017    Significant Diagnostic Results in last 30 days:  Dg Lumbar Spine Complete  Result Date: 04/14/2017 CLINICAL DATA:  Low back pain for the past week. Fall a few weeks ago. EXAM: LUMBAR SPINE - COMPLETE 4+ VIEW COMPARISON:  CT lumbar spine dated April 17, 2014. FINDINGS: Unchanged lumbarization of S1. Questionable irregularity along the superior endplate of L1 with minimal height loss. Remaining vertebral body heights are preserved. Unchanged trace retrolisthesis at L1-L2. Unchanged grade 1 anterolisthesis at L4-L5 and L5-S1. Mild disc height loss at L4-L5 and L5-S1, not significantly  changed. Severe bilateral facet arthropathy at L4-L5 and L5-S1. Osteopenia. Aortoiliac atherosclerotic vascular disease. IMPRESSION: 1. Questionable irregularity along the superior endplate of L1 with minimal height loss, possibly representing a very mild compression fracture. Correlate with point tenderness and consider CT for further evaluation. 2. Relatively unchanged mild degenerative disc disease and severe facet arthropathy at L4-L5 and L5-S1, with resultant grade 1 anterolisthesis at both levels. Electronically Signed   By: Titus Dubin M.D.   On: 04/14/2017  09:03   Dg Abdomen Acute W/chest  Result Date: 04/14/2017 CLINICAL DATA:  Weakness and constipation EXAM: DG ABDOMEN ACUTE W/ 1V CHEST COMPARISON:  Abdominal radiograph September 21, 2011; CT abdomen and pelvis September 19, 2011 FINDINGS: PA chest: No edema or consolidation. Heart is mildly enlarged with pulmonary vascularity within normal limits. There is aortic atherosclerosis. There is an old healed fracture of the right posterior sixth rib. Supine and upright abdomen: There is fairly diffuse stool throughout the colon. There is no bowel dilatation or air-fluid level to suggest bowel obstruction. No free air. IMPRESSION: Fairly diffuse stool throughout colon. No bowel obstruction or free air. No edema or consolidation. Mild cardiomegaly. Aortic atherosclerosis. Aortic Atherosclerosis (ICD10-I70.0). Electronically Signed   By: Lowella Grip III M.D.   On: 04/14/2017 08:54   Ct Renal Stone Study  Result Date: 04/14/2017 CLINICAL DATA:  Low back pain for 1 week. The patient reports a fall 2-3 weeks ago. EXAM: CT ABDOMEN AND PELVIS WITHOUT CONTRAST TECHNIQUE: Multidetector CT imaging of the abdomen and pelvis was performed following the standard protocol without IV contrast. COMPARISON:  Plain films lumbar spine 04/14/2017. CT lumbar spine 04/17/2014. FINDINGS: Lower chest: There is some linear scar in the lung bases. Lung bases are otherwise clear.  No pleural or pericardial effusion. Heart size is mildly enlarged. Hepatobiliary: Multiple gravel type stones are seen layering dependently within the gallbladder. Stones have increased in number since the prior CT. No evidence of cholecystitis. The liver and biliary tree are unremarkable. Pancreas: Unremarkable. No pancreatic ductal dilatation or surrounding inflammatory changes. Spleen: Normal in size without focal abnormality. Adrenals/Urinary Tract: Adrenal glands are unremarkable. Kidneys are normal, without renal calculi, focal lesion, or hydronephrosis. Bladder is unremarkable. Stomach/Bowel: Moderate hiatal hernia is seen. The stomach is otherwise unremarkable. Diverticulosis without diverticulitis is worst in the sigmoid. The patient is status post right hemicolectomy. Moderate stool burden in the ascending and transverse colon is identified. Small bowel appears normal. Vascular/Lymphatic: Aortic atherosclerosis. No enlarged abdominal or pelvic lymph nodes. Reproductive: Status post hysterectomy. No adnexal masses. Other: A large hernia in the lateral aspect of the anterior abdominal wall most consistent with an incisional hernia contains loops of bowel without obstruction other complicating feature. The hernia is unchanged. No ascites. Musculoskeletal: Transitional lumbosacral anatomy is seen. Numbering scheme is same as that used on prior plain films lumbar spine. The patient has a mild superior endplate compression fracture of L1 with vertebral body height loss of 10-15%. There is slight bony retropulsion off the superior endplate which does not central canal stenosis. Fracture margins are sharp consistent with acute or subacute injury. The patient's fracture does not involve the posterior elements. The patient appears to have a large broad-based disc protrusion at T12-L1. No other focal bony abnormality is identified. Facet arthropathy resulting in 0.8 cm anterolisthesis L5 on S1 noted. IMPRESSION:  Acute or subacute superior endplate compression fracture with vertebral body height loss of 10-15% does not involve the posterior elements. There is minimal bony retropulsion off the superior endplate. The patient appears to have a large disc herniation at T12-L1. MRI of the lumbar spine without contrast could be used for further evaluation. Moderate hiatal hernia. Gallstones without cholecystitis. Right lateral incisional hernia containing loops of bowel without obstruction other complicating feature. The hernia is unchanged. Atherosclerosis. Electronically Signed   By: Inge Rise M.D.   On: 04/14/2017 09:59    Assessment/Plan  Lethargic  Acute cystitis without hematuria  Hyponatremia  Vitamin B12 deficiency  Insert and maintain peripheral IV for infusion of fluids  0.9% NS- give 250 mL NS bolus over 1 hour, then continuous at 50 mL/hr x 48 hours  Cipro 250 mg po Q 12 hours x 5 days  Cyanocobalamin 2,000 mcg po Q Day x 14 days, then  Cyanocobalamin 1,000 mcg po Q Day  Encourage po fluid intake  Family/ staff Communication:   Total Time:  Documentation:  Face to Face:  Family/Phone:   Labs/tests ordered: cbc, met b   Medication list reviewed and assessed for continued appropriateness.  Vikki Ports, NP-C Geriatrics [facility] [phone] [place], [place] [address] Cell Phone (Mon-Fri 8am-5pm):  [phone] On Call:  [phone] & follow prompts after 5pm & weekends Office Phone:  [phone] Office Fax:  [phone]

## 2017-04-23 ENCOUNTER — Other Ambulatory Visit
Admission: RE | Admit: 2017-04-23 | Discharge: 2017-04-23 | Disposition: A | Discharge status: Home / Self Care | Payer: Medicare Other | Source: Skilled Nursing Facility | Attending: Gerontology | Admitting: Gerontology

## 2017-04-23 DIAGNOSIS — E871 Hypo-osmolality and hyponatremia: Secondary | ICD-10-CM | POA: Insufficient documentation

## 2017-04-23 DIAGNOSIS — D649 Anemia, unspecified: Secondary | ICD-10-CM | POA: Insufficient documentation

## 2017-04-23 LAB — URINE CULTURE
Culture: >=100000 — AB
Culture: NO GROWTH

## 2017-04-23 LAB — BASIC METABOLIC PANEL
ANION GAP: 11 (ref 5–15)
BUN: 26 mg/dL — ABNORMAL HIGH (ref 6–20)
CALCIUM: 8.2 mg/dL — AB (ref 8.9–10.3)
CO2: 23 mmol/L (ref 22–32)
Chloride: 96 mmol/L — ABNORMAL LOW (ref 101–111)
Creatinine, Ser: 0.87 mg/dL (ref 0.44–1.00)
GFR calc non Af Amer: 56 mL/min — ABNORMAL LOW (ref >60)
GLUCOSE: 130 mg/dL — AB (ref 65–99)
POTASSIUM: 3.2 mmol/L — AB (ref 3.5–5.1)
SODIUM: 130 mmol/L — AB (ref 135–145)

## 2017-04-23 LAB — CBC WITH DIFFERENTIAL/PLATELET
BASOS ABS: 0 10*3/uL (ref 0–0.1)
Basophils Relative: 0 %
Eosinophils Absolute: 0.1 10*3/uL (ref 0–0.7)
Eosinophils Relative: 1 %
HCT: 32.2 % — ABNORMAL LOW (ref 35.0–47.0)
Hemoglobin: 10.3 g/dL — ABNORMAL LOW (ref 12.0–16.0)
LYMPHS ABS: 0.7 10*3/uL — AB (ref 1.0–3.6)
Lymphocytes Relative: 5 %
MCH: 23.3 pg — ABNORMAL LOW (ref 26.0–34.0)
MCHC: 31.9 g/dL — ABNORMAL LOW (ref 32.0–36.0)
MCV: 73.2 fL — AB (ref 80.0–100.0)
MONO ABS: 1.2 10*3/uL — AB (ref 0.2–0.9)
Monocytes Relative: 9 %
NEUTROS PCT: 85 %
Neutro Abs: 11.2 10*3/uL — ABNORMAL HIGH (ref 1.4–6.5)
PLATELETS: 477 10*3/uL — AB (ref 150–440)
RBC: 4.4 MIL/uL (ref 3.80–5.20)
RDW: 18.3 % — AB (ref 11.5–14.5)
WBC: 13.2 10*3/uL — AB (ref 3.6–11.0)

## 2017-04-27 ENCOUNTER — Other Ambulatory Visit
Admission: RE | Admit: 2017-04-27 | Discharge: 2017-04-27 | Disposition: A | Discharge status: Home / Self Care | Payer: Medicare Other | Source: Ambulatory Visit | Attending: Gerontology | Admitting: Gerontology

## 2017-04-27 ENCOUNTER — Other Ambulatory Visit
Admission: RE | Admit: 2017-04-27 | Discharge: 2017-04-27 | Disposition: A | Discharge status: Home / Self Care | Payer: Medicare Other | Source: Ambulatory Visit | Attending: Internal Medicine | Admitting: Internal Medicine

## 2017-04-27 DIAGNOSIS — E871 Hypo-osmolality and hyponatremia: Secondary | ICD-10-CM | POA: Insufficient documentation

## 2017-04-27 DIAGNOSIS — D649 Anemia, unspecified: Secondary | ICD-10-CM | POA: Insufficient documentation

## 2017-04-27 LAB — CBC WITH DIFFERENTIAL/PLATELET
BASOS ABS: 0.2 10*3/uL — AB (ref 0–0.1)
BLASTS: 0 %
Band Neutrophils: 0 %
Basophils Relative: 1 %
Eosinophils Absolute: 0.6 10*3/uL (ref 0–0.7)
Eosinophils Relative: 3 %
HEMATOCRIT: 33.7 % — AB (ref 35.0–47.0)
Hemoglobin: 10.4 g/dL — ABNORMAL LOW (ref 12.0–16.0)
LYMPHS ABS: 2.7 10*3/uL (ref 1.0–3.6)
Lymphocytes Relative: 14 %
MCH: 23 pg — ABNORMAL LOW (ref 26.0–34.0)
MCHC: 30.9 g/dL — ABNORMAL LOW (ref 32.0–36.0)
MCV: 74.5 fL — AB (ref 80.0–100.0)
METAMYELOCYTES PCT: 0 %
Monocytes Absolute: 1.4 10*3/uL — ABNORMAL HIGH (ref 0.2–0.9)
Monocytes Relative: 7 %
Myelocytes: 1 %
Neutro Abs: 14.4 10*3/uL — ABNORMAL HIGH (ref 1.4–6.5)
Neutrophils Relative %: 74 %
Other: 0 %
PLATELETS: 542 10*3/uL — AB (ref 150–440)
Promyelocytes Absolute: 0 %
RBC: 4.53 MIL/uL (ref 3.80–5.20)
RDW: 18.9 % — ABNORMAL HIGH (ref 11.5–14.5)
WBC: 19.3 10*3/uL — AB (ref 3.6–11.0)
nRBC: 0 /100 WBC

## 2017-04-27 LAB — BASIC METABOLIC PANEL
ANION GAP: 7 (ref 5–15)
BUN: 16 mg/dL (ref 6–20)
CO2: 27 mmol/L (ref 22–32)
Calcium: 8.1 mg/dL — ABNORMAL LOW (ref 8.9–10.3)
Chloride: 98 mmol/L — ABNORMAL LOW (ref 101–111)
Creatinine, Ser: 0.71 mg/dL (ref 0.44–1.00)
GFR calc Af Amer: >60 mL/min (ref >60)
GLUCOSE: 98 mg/dL (ref 65–99)
POTASSIUM: 3.6 mmol/L (ref 3.5–5.1)
Sodium: 132 mmol/L — ABNORMAL LOW (ref 135–145)

## 2017-04-29 ENCOUNTER — Other Ambulatory Visit
Admission: RE | Admit: 2017-04-29 | Discharge: 2017-04-29 | Disposition: A | Discharge status: Home / Self Care | Payer: Medicare Other | Source: Ambulatory Visit | Attending: Gerontology | Admitting: Gerontology

## 2017-04-29 DIAGNOSIS — D72829 Elevated white blood cell count, unspecified: Secondary | ICD-10-CM | POA: Diagnosis present

## 2017-04-29 LAB — C DIFFICILE QUICK SCREEN W PCR REFLEX
C DIFFICILE (CDIFF) INTERP: NOT DETECTED
C DIFFICILE (CDIFF) TOXIN: NEGATIVE
C DIFFICLE (CDIFF) ANTIGEN: NEGATIVE

## 2017-04-30 ENCOUNTER — Non-Acute Institutional Stay (SKILLED_NURSING_FACILITY): Payer: Medicare Other | Admitting: Gerontology

## 2017-04-30 ENCOUNTER — Encounter: Payer: Self-pay | Admitting: Gerontology

## 2017-04-30 DIAGNOSIS — N3000 Acute cystitis without hematuria: Secondary | ICD-10-CM

## 2017-04-30 DIAGNOSIS — R531 Weakness: Secondary | ICD-10-CM

## 2017-04-30 DIAGNOSIS — E871 Hypo-osmolality and hyponatremia: Secondary | ICD-10-CM | POA: Diagnosis not present

## 2017-04-30 NOTE — Progress Notes (Signed)
Location:   [facility] Room Number: [phone] Place of Service:  SNF [phone]) Provider:  Toni Arthurs, NP-C  Kirk Ruths, MD  Patient Care Team: Kirk Ruths, MD as PCP - General (Internal Medicine)  Extended Emergency Contact Information Primary Emergency Contact: Dondra Spry [place] Phone: [phone] Mobile Phone: [phone] Relation: Daughter Secondary Emergency Contact: Costella Hatcher [place] of [place] Phone: [phone] Relation: Son  Code Status:  DNR Goals of care: Advanced Directive information Advanced Directives 04/30/2017  Does Patient Have a Medical Advance Directive? Yes  Type of Paramedic of [place];Living will  Does patient want to make changes to medical advance directive? No - Patient declined  Copy of [place] in Chart? Yes  Would patient like information on creating a medical advance directive? -     Chief Complaint  Patient presents with  . Medical Management of Chronic Issues    Routine Visit    HPI:  Pt is a 84 y.o. female seen today for medical management of chronic diseases. Pt was admitted to the facility for rehab for generalized weakness and deconditioning. Pt has been participating with PT/OT as able. She continues to be 1-2+ max assist with transfers. Pt had a UTI, but has completed treatment for this. No further symptoms. Pt was found to have mild hyponatremia on previous labs. Will recheck labs for resolution. Pt has not had any increased weakness or confusion. Appetite is fair. Pt is voiding well and having regular BMs. Overall, she reports she is feeling better, but wants to move more. She wants to be able to get up and ambulate in the hallways. Will defer to PT for this. VSS. No other complaints.       Past Medical History:  Diagnosis Date  . Allergic rhinitis 11/18/2013  . Asthma with COPD (chronic obstructive  pulmonary disease) ([place]) 11/18/2013   Last Assessment & Plan:  No flair ups noted recently and breathing is essentially at baseline.    . Asthma without status asthmaticus    unspecified  . Borderline hypothyroidism 11/18/2013   Last Assessment & Plan:  Energy and tsh are doing well  . Cancer [facility])    colon cancer- surgically removed in 2006  . Cholelithiases   . Chronic kidney disease   . Colon cancer ([place])   . Complete left bundle branch block (LBBB) 11/18/2013  . Constipation 11/18/2013  . Diverticulosis   . Hiatal hernia    right  . HLD (hyperlipidemia) 11/18/2013   Last Assessment & Plan:  Appropriate diet is followed and no myalgia's are noted.    . Hyperlipidemia, unspecified   . Hypertension   . Hypertensive kidney disease with CKD stage III ([place]) 11/18/2013   Last Assessment & Plan:  Is compliant with hypertensive medications without clear side effects or lack of control.  Nausea and itching are not symptomatic and nsaids are being avoided.    . Melanoma ([place])   . Osteopenia    Past Surgical History:  Procedure Laterality Date  . ABDOMINAL HYSTERECTOMY    . Bladder Lift    . BREAST EXCISIONAL BIOPSY Left 15+ yrs ago   neg  . COLON RESECTION  2006   colon cancer  . COLON SURGERY    . COLON SURGERY Right   . SKIN CANCER EXCISION  2006   melanoma    No Known Allergies  Allergies as of 04/30/2017   No Known Allergies  Medication List        Accurate as of 04/30/17  4:02 PM. Always use your most recent med list.          acetaminophen 325 MG tablet Commonly known as:  TYLENOL Take 650 mg by mouth every 4 (four) hours as needed. for pain/ increased temp. May be administered orally, per G-tube if needed or rectally if unable to swallow (separate order). Maximum dose for 24 hours is 3,000 mg from all sources of Acetaminophen/ Tylenol   aspirin EC 81 MG tablet Take 81 mg by mouth at bedtime. Heart Health- per home regimen   bisacodyl 10 MG suppository Commonly  known as:  DULCOLAX Place 10 mg rectally 2 (two) times daily as needed.   calcium carbonate 750 MG chewable tablet Commonly known as:  TUMS EX Chew 2 tablets by mouth daily.   cyanocobalamin 1000 MCG tablet Take 2,000 mcg by mouth daily.   docusate sodium 100 MG capsule Commonly known as:  COLACE Take 1 capsule (100 mg total) by mouth daily as needed.   fluticasone 50 MCG/ACT nasal spray Commonly known as:  FLONASE USE 2 SPRAYS IN EACH       NOSTRIL DAILY   lactulose 10 GM/15ML solution Commonly known as:  CHRONULAC Take 30 g by mouth 2 (two) times daily as needed.   polyethylene glycol packet Commonly known as:  MIRALAX / GLYCOLAX Take 17 g by mouth daily.   RISA-BID PROBIOTIC Tabs Take 1 tablet by mouth 2 (two) times daily.   VITAMIN D-1000 MAX ST 1000 units tablet Generic drug:  Cholecalciferol Take 2,000 Units by mouth daily. 2 tabs       Review of Systems  Constitutional: Negative for activity change, appetite change, chills, diaphoresis and fever.  HENT: Negative for congestion, mouth sores, nosebleeds, postnasal drip, sneezing, sore throat, trouble swallowing and voice change.   Respiratory: Negative for apnea, cough, choking, chest tightness, shortness of breath and wheezing.   Cardiovascular: Negative for chest pain, palpitations and leg swelling.  Gastrointestinal: Negative for abdominal distention, abdominal pain, constipation, diarrhea and nausea.  Genitourinary: Negative for difficulty urinating, dysuria, frequency and urgency.  Musculoskeletal: Positive for arthralgias (typical arthritis) and gait problem. Negative for back pain and myalgias.  Skin: Negative for color change, pallor, rash and wound.  Neurological: Positive for weakness. Negative for dizziness, tremors, syncope, speech difficulty, numbness and headaches.  Psychiatric/Behavioral: Negative for agitation and behavioral problems.  All other systems reviewed and are negative.   Immunization  History  Administered Date(s) Administered  . PPD Test 09/09/2016  . Pneumococcal Conjugate-13 12/16/2016  . Pneumococcal-Unspecified 11/28/2008   Pertinent  Health Maintenance Due  Topic Date Due  . DEXA SCAN  11/28/1988  . INFLUENZA VACCINE  09/30/2016  . PNA vac Low Risk Adult  Completed   No flowsheet data found. Functional Status Survey:    Vitals:   04/30/17 1557  BP: 104/75  Pulse: 94  Resp: 18  Temp: 97.6 F (36.4 C)  TempSrc: Oral  SpO2: 94%  Weight: 138 lb 6.4 oz (62.8 kg)  Height: 5' 1" (1.549 m)   Body mass index is 26.15 kg/m. Physical Exam  Constitutional: She is oriented to person, place, and time. Vital signs are normal. She appears well-developed and well-nourished. She is active and cooperative. She does not appear ill. No distress.  HENT:  Head: Normocephalic and atraumatic.  Mouth/Throat: Uvula is midline, oropharynx is clear and moist and mucous membranes are normal. Mucous membranes are not  pale, not dry and not cyanotic.  Eyes: Pupils are equal, round, and reactive to light. Conjunctivae, EOM and lids are normal.  Neck: Trachea normal, normal range of motion and full passive range of motion without pain. Neck supple. No JVD present. No tracheal deviation, no edema and no erythema present. No thyromegaly present.  Cardiovascular: Normal rate, regular rhythm, intact distal pulses and normal pulses. Exam reveals no gallop, no distant heart sounds and no friction rub.  Murmur heard. Pulses:      Dorsalis pedis pulses are 2+ on the right side, and 2+ on the left side.  No edema  Pulmonary/Chest: Effort normal and breath sounds normal. No accessory muscle usage. No respiratory distress. She has no decreased breath sounds. She has no wheezes. She has no rhonchi. She has no rales. She exhibits no tenderness.  Abdominal: Soft. Normal appearance and bowel sounds are normal. She exhibits no distension and no ascites. There is no tenderness.  Musculoskeletal:  Normal range of motion. She exhibits no edema or tenderness.  Expected osteoarthritis, stiffness; Bilateral Calves soft, supple. Negative Homan's Sign. B- pedal pulses equal; generalized weakness  Neurological: She is alert and oriented to person, place, and time. She has normal strength. She exhibits abnormal muscle tone. Coordination and gait abnormal.  Skin: Skin is warm, dry and intact. She is not diaphoretic. No cyanosis. No pallor. Nails show no clubbing.  Psychiatric: She has a normal mood and affect. Her speech is normal and behavior is normal. Judgment and thought content normal. Cognition and memory are impaired.  Nursing note and vitals reviewed.   Labs reviewed: Recent Labs    04/20/17 1505 04/23/17 1004 04/27/17 1320  NA 133* 130* 132*  K 3.6 3.2* 3.6  CL 93* 96* 98*  CO2 _0 GLUCOSE 116* 130* 98  BUN 39* 26* 16  CREATININE 0.93 0.87 0.71  CALCIUM 10.0 8.2* 8.1*  MG 1.8  --   --    Recent Labs    09/11/16 1218 04/14/17 0936 04/20/17 1505  AST _1 ALT 14 10* 11*  ALKPHOS 49 75 69  BILITOT 1.1 0.8 0.6  PROT 6.2* 6.5 6.7  ALBUMIN 3.6 3.5 3.6   Recent Labs    04/20/17 1505 04/23/17 1004 04/27/17 1110  WBC 19.1* 13.2* 19.3*  NEUTROABS 17.2* 11.2* 14.4*  HGB 10.8* 10.3* 10.4*  HCT 32.7* 32.2* 33.7*  MCV 72.6* 73.2* 74.5*  PLT 485* 477* 542*   Lab Results  Component Value Date   TSH 3.269 04/20/2017   No results found for: HGBA1C Lab Results  Component Value Date   CHOL 127 04/20/2017   HDL 74 04/20/2017   LDLCALC 40 04/20/2017   TRIG 66 04/20/2017   CHOLHDL 1.7 04/20/2017    Significant Diagnostic Results in last 30 days:  Dg Lumbar Spine Complete  Result Date: 04/14/2017 CLINICAL DATA:  Low back pain for the past week. Fall a few weeks ago. EXAM: LUMBAR SPINE - COMPLETE 4+ VIEW COMPARISON:  CT lumbar spine dated April 17, 2014. FINDINGS: Unchanged lumbarization of S1. Questionable irregularity along the superior endplate of L1  with minimal height loss. Remaining vertebral body heights are preserved. Unchanged trace retrolisthesis at L1-L2. Unchanged grade 1 anterolisthesis at L4-L5 and L5-S1. Mild disc height loss at L4-L5 and L5-S1, not significantly changed. Severe bilateral facet arthropathy at L4-L5 and L5-S1. Osteopenia. Aortoiliac atherosclerotic vascular disease. IMPRESSION: 1. Questionable irregularity along the superior endplate of L1 with minimal height loss, possibly representing  a very mild compression fracture. Correlate with point tenderness and consider CT for further evaluation. 2. Relatively unchanged mild degenerative disc disease and severe facet arthropathy at L4-L5 and L5-S1, with resultant grade 1 anterolisthesis at both levels. Electronically Signed   By: Titus Dubin M.D.   On: 04/14/2017 09:03   Dg Abdomen Acute W/chest  Result Date: 04/14/2017 CLINICAL DATA:  Weakness and constipation EXAM: DG ABDOMEN ACUTE W/ 1V CHEST COMPARISON:  Abdominal radiograph September 21, 2011; CT abdomen and pelvis September 19, 2011 FINDINGS: PA chest: No edema or consolidation. Heart is mildly enlarged with pulmonary vascularity within normal limits. There is aortic atherosclerosis. There is an old healed fracture of the right posterior sixth rib. Supine and upright abdomen: There is fairly diffuse stool throughout the colon. There is no bowel dilatation or air-fluid level to suggest bowel obstruction. No free air. IMPRESSION: Fairly diffuse stool throughout colon. No bowel obstruction or free air. No edema or consolidation. Mild cardiomegaly. Aortic atherosclerosis. Aortic Atherosclerosis (ICD10-I70.0). Electronically Signed   By: Lowella Grip III M.D.   On: 04/14/2017 08:54   Ct Renal Stone Study  Result Date: 04/14/2017 CLINICAL DATA:  Low back pain for 1 week. The patient reports a fall 2-3 weeks ago. EXAM: CT ABDOMEN AND PELVIS WITHOUT CONTRAST TECHNIQUE: Multidetector CT imaging of the abdomen and pelvis was performed  following the standard protocol without IV contrast. COMPARISON:  Plain films lumbar spine 04/14/2017. CT lumbar spine 04/17/2014. FINDINGS: Lower chest: There is some linear scar in the lung bases. Lung bases are otherwise clear. No pleural or pericardial effusion. Heart size is mildly enlarged. Hepatobiliary: Multiple gravel type stones are seen layering dependently within the gallbladder. Stones have increased in number since the prior CT. No evidence of cholecystitis. The liver and biliary tree are unremarkable. Pancreas: Unremarkable. No pancreatic ductal dilatation or surrounding inflammatory changes. Spleen: Normal in size without focal abnormality. Adrenals/Urinary Tract: Adrenal glands are unremarkable. Kidneys are normal, without renal calculi, focal lesion, or hydronephrosis. Bladder is unremarkable. Stomach/Bowel: Moderate hiatal hernia is seen. The stomach is otherwise unremarkable. Diverticulosis without diverticulitis is worst in the sigmoid. The patient is status post right hemicolectomy. Moderate stool burden in the ascending and transverse colon is identified. Small bowel appears normal. Vascular/Lymphatic: Aortic atherosclerosis. No enlarged abdominal or pelvic lymph nodes. Reproductive: Status post hysterectomy. No adnexal masses. Other: A large hernia in the lateral aspect of the anterior abdominal wall most consistent with an incisional hernia contains loops of bowel without obstruction other complicating feature. The hernia is unchanged. No ascites. Musculoskeletal: Transitional lumbosacral anatomy is seen. Numbering scheme is same as that used on prior plain films lumbar spine. The patient has a mild superior endplate compression fracture of L1 with vertebral body height loss of 10-15%. There is slight bony retropulsion off the superior endplate which does not central canal stenosis. Fracture margins are sharp consistent with acute or subacute injury. The patient's fracture does not involve  the posterior elements. The patient appears to have a large broad-based disc protrusion at T12-L1. No other focal bony abnormality is identified. Facet arthropathy resulting in 0.8 cm anterolisthesis L5 on S1 noted. IMPRESSION: Acute or subacute superior endplate compression fracture with vertebral body height loss of 10-15% does not involve the posterior elements. There is minimal bony retropulsion off the superior endplate. The patient appears to have a large disc herniation at T12-L1. MRI of the lumbar spine without contrast could be used for further evaluation. Moderate hiatal hernia. Gallstones without cholecystitis.  Right lateral incisional hernia containing loops of bowel without obstruction other complicating feature. The hernia is unchanged. Atherosclerosis. Electronically Signed   By: Inge Rise M.D.   On: 04/14/2017 09:59    Assessment/Plan  Generalized weakness  Continue PT/OT  Continue exercises as taught by PT/OT  Ambulate with assistance  Ambulate with walker  Safety precautions  Fall precautions  Acute cystitis without hematuria  Resolved   Hyponatremia  Labs in the am to evaluate for resolution  Family/ staff Communication:   Total Time:  Documentation:  Face to Face:  Family/Phone:   Labs/tests ordered:  Cbc, met c  Medication list reviewed and assessed for continued appropriateness. Monthly medication orders reviewed and signed.  Vikki Ports, NP-C Geriatrics [facility] [phone], [place] [address] Cell Phone (Mon-Fri 8am-5pm):  [phone] On Call:  [phone] & follow prompts after 5pm & weekends Office Phone:  [phone] Office Fax:  [phone]

## 2017-05-01 ENCOUNTER — Other Ambulatory Visit
Admission: RE | Admit: 2017-05-01 | Discharge: 2017-05-01 | Disposition: A | Discharge status: Home / Self Care | Payer: Medicare Other | Source: Skilled Nursing Facility | Attending: Gerontology | Admitting: Gerontology

## 2017-05-01 DIAGNOSIS — R5383 Other fatigue: Secondary | ICD-10-CM | POA: Diagnosis not present

## 2017-05-01 DIAGNOSIS — R531 Weakness: Secondary | ICD-10-CM | POA: Insufficient documentation

## 2017-05-01 LAB — CBC
HCT: 32.3 % — ABNORMAL LOW (ref 35.0–47.0)
Hemoglobin: 10 g/dL — ABNORMAL LOW (ref 12.0–16.0)
MCH: 23.1 pg — ABNORMAL LOW (ref 26.0–34.0)
MCHC: 30.8 g/dL — AB (ref 32.0–36.0)
MCV: 75.1 fL — ABNORMAL LOW (ref 80.0–100.0)
Platelets: 498 10*3/uL — ABNORMAL HIGH (ref 150–440)
RBC: 4.3 MIL/uL (ref 3.80–5.20)
RDW: 19.7 % — AB (ref 11.5–14.5)
WBC: 13.5 10*3/uL — ABNORMAL HIGH (ref 3.6–11.0)

## 2017-05-01 LAB — COMPREHENSIVE METABOLIC PANEL
ALBUMIN: 3.1 g/dL — AB (ref 3.5–5.0)
ALT: 14 U/L (ref 14–54)
ANION GAP: 12 (ref 5–15)
AST: 26 U/L (ref 15–41)
Alkaline Phosphatase: 91 U/L (ref 38–126)
BUN: 19 mg/dL (ref 6–20)
CO2: 23 mmol/L (ref 22–32)
Calcium: 8.1 mg/dL — ABNORMAL LOW (ref 8.9–10.3)
Chloride: 98 mmol/L — ABNORMAL LOW (ref 101–111)
Creatinine, Ser: 0.76 mg/dL (ref 0.44–1.00)
GFR calc non Af Amer: >60 mL/min (ref >60)
GLUCOSE: 165 mg/dL — AB (ref 65–99)
POTASSIUM: 3.8 mmol/L (ref 3.5–5.1)
SODIUM: 133 mmol/L — AB (ref 135–145)
TOTAL PROTEIN: 5.7 g/dL — AB (ref 6.5–8.1)
Total Bilirubin: 0.6 mg/dL (ref 0.3–1.2)

## 2017-05-19 ENCOUNTER — Non-Acute Institutional Stay (SKILLED_NURSING_FACILITY): Payer: Medicare Other | Admitting: Gerontology

## 2017-05-19 DIAGNOSIS — N183 Chronic kidney disease, stage 3 (moderate): Secondary | ICD-10-CM | POA: Diagnosis not present

## 2017-05-19 DIAGNOSIS — I129 Hypertensive chronic kidney disease with stage 1 through stage 4 chronic kidney disease, or unspecified chronic kidney disease: Secondary | ICD-10-CM | POA: Diagnosis not present

## 2017-05-19 DIAGNOSIS — R531 Weakness: Secondary | ICD-10-CM

## 2017-05-19 DIAGNOSIS — K5900 Constipation, unspecified: Secondary | ICD-10-CM

## 2017-05-20 ENCOUNTER — Encounter: Payer: Self-pay | Admitting: Gerontology

## 2017-05-20 DIAGNOSIS — R531 Weakness: Secondary | ICD-10-CM | POA: Insufficient documentation

## 2017-05-20 NOTE — Progress Notes (Signed)
Location:   [facility] Room Number: [phone] Place of Service:  SNF [phone]) Provider:  Toni Arthurs, NP-C  Kirk Ruths, MD  Patient Care Team: Kirk Ruths, MD as PCP - General (Internal Medicine)  Extended Emergency Contact Information Primary Emergency Contact: Dondra Spry [place] of [place] Phone: [phone] Mobile Phone: [phone] Relation: Daughter Secondary Emergency Contact: Costella Hatcher [place] of [place] Phone: [phone] Relation: Son  Code Status:  DNR Goals of care: Advanced Directive information Advanced Directives 05/19/2017  Does Patient Have a Medical Advance Directive? Yes  Type of Paramedic of [place];Living will;Out of facility DNR (pink MOST or yellow form)  Does patient want to make changes to medical advance directive? No - Patient declined  Copy of [place] in Chart? Yes  Would patient like information on creating a medical advance directive? -     Chief Complaint  Patient presents with  . Medical Management of Chronic Issues    Routine Visit    HPI:  Pt is a 84 y.o. female seen today for medical management of chronic diseases.    Constipation, unspecified constipation type Stable.  No further issues with constipation.  Symptoms controlled with probiotic 250 mg 1 tablet p.o. twice daily, MiraLAX 17 g p.o. daily mixed with 4-6 ounces of fluid Colace 100 mg p.o. daily as needed Dulcolax suppositories 10 mg twice daily as needed, lactulose 30 g p.o. twice daily as needed  Hypertensive kidney disease with CKD stage III (HCC) Stable.  No episodes of hypotension.  Patient denies chest pain or shortness of breath.  Average blood pressure reading low end of normal.  Hyzaar was DC'd previously due to hypotension.  We will continue to monitor further needs for antihypertensive.  No need at this time.  Generalized weakness Patient has been  working with PT/OT.  Patient is ambulatory with with rolling walker independently.  However, she is max assist to get from sit to stand positioning.  Once standing, patient is able to freely ambulate at least 30 feet without difficulty.  Therapy has been working with her on the exercise bike, etc. for endurance and strengthening.  Patient denies pain  Please note pt with limited cognitive ability/ memory issues. Unable to obtain complete ROS. Some ROS info obtained from staff and documentation.   Past Medical History:  Diagnosis Date  . Allergic rhinitis 11/18/2013  . Asthma with COPD (chronic obstructive pulmonary disease) ([place]) 11/18/2013   Last Assessment & Plan:  No flair ups noted recently and breathing is essentially at baseline.    . Asthma without status asthmaticus    unspecified  . Borderline hypothyroidism 11/18/2013   Last Assessment & Plan:  Energy and tsh are doing well  . Cancer [facility])    colon cancer- surgically removed in 2006  . Cholelithiases   . Chronic kidney disease   . Colon cancer ([place])   . Complete left bundle branch block (LBBB) 11/18/2013  . Constipation 11/18/2013  . Diverticulosis   . Hiatal hernia    right  . HLD (hyperlipidemia) 11/18/2013   Last Assessment & Plan:  Appropriate diet is followed and no myalgia's are noted.    . Hyperlipidemia, unspecified   . Hypertension   . Hypertensive kidney disease with CKD stage III ([place]) 11/18/2013   Last Assessment & Plan:  Is compliant with hypertensive medications without clear side effects or lack of control.  Nausea and itching are not symptomatic  and nsaids are being avoided.    . Melanoma ([place])   . Osteopenia    Past Surgical History:  Procedure Laterality Date  . ABDOMINAL HYSTERECTOMY    . Bladder Lift    . BREAST EXCISIONAL BIOPSY Left 15+ yrs ago   neg  . COLON RESECTION  2006   colon cancer  . COLON SURGERY    . COLON SURGERY Right   . SKIN CANCER EXCISION  2006   melanoma    No Known  Allergies  Allergies as of 05/19/2017   No Known Allergies     Medication List        Accurate as of 05/19/17 11:59 PM. Always use your most recent med list.          acetaminophen 325 MG tablet Commonly known as:  TYLENOL Take 650 mg by mouth every 4 (four) hours as needed. for pain/ increased temp. May be administered orally, per G-tube if needed or rectally if unable to swallow (separate order). Maximum dose for 24 hours is 3,000 mg from all sources of Acetaminophen/ Tylenol   aspirin EC 81 MG tablet Take 81 mg by mouth at bedtime. Heart Health- per home regimen   bisacodyl 10 MG suppository Commonly known as:  DULCOLAX Place 10 mg rectally 2 (two) times daily as needed.   calcium carbonate 750 MG chewable tablet Commonly known as:  TUMS EX Chew 2 tablets by mouth daily.   cyanocobalamin 1000 MCG tablet Take 1,000 mcg by mouth daily.   docusate sodium 100 MG capsule Commonly known as:  COLACE Take 1 capsule (100 mg total) by mouth daily as needed.   fluticasone 50 MCG/ACT nasal spray Commonly known as:  FLONASE USE 2 SPRAYS IN EACH       NOSTRIL DAILY   lactulose 10 GM/15ML solution Commonly known as:  CHRONULAC Take 30 g by mouth 2 (two) times daily as needed.   polyethylene glycol packet Commonly known as:  MIRALAX / GLYCOLAX Take 17 g by mouth daily.   RISA-BID PROBIOTIC Tabs Take 1 tablet by mouth 2 (two) times daily.   VITAMIN D-1000 MAX ST 1000 units tablet Generic drug:  Cholecalciferol Take 2,000 Units by mouth daily. 2 tabs       Review of Systems  Unable to perform ROS: Dementia  Constitutional: Negative for activity change, appetite change, chills, diaphoresis and fever.  HENT: Negative for congestion, mouth sores, nosebleeds, postnasal drip, sneezing, sore throat, trouble swallowing and voice change.   Respiratory: Negative for apnea, cough, choking, chest tightness, shortness of breath and wheezing.   Cardiovascular: Negative for chest  pain, palpitations and leg swelling.  Gastrointestinal: Negative for abdominal distention, abdominal pain, constipation, diarrhea and nausea.  Genitourinary: Negative for difficulty urinating, dysuria, frequency and urgency.  Musculoskeletal: Positive for arthralgias (typical arthritis). Negative for back pain, gait problem and myalgias.  Skin: Negative for color change, pallor, rash and wound.  Neurological: Positive for weakness. Negative for dizziness, tremors, syncope, speech difficulty, numbness and headaches.  Psychiatric/Behavioral: Negative for agitation and behavioral problems.  All other systems reviewed and are negative.   Immunization History  Administered Date(s) Administered  . PPD Test 09/09/2016  . Pneumococcal Conjugate-13 12/16/2016  . Pneumococcal-Unspecified 11/28/2008   Pertinent  Health Maintenance Due  Topic Date Due  . DEXA SCAN  11/28/1988  . INFLUENZA VACCINE  09/30/2016  . PNA vac Low Risk Adult  Completed   No flowsheet data found. Functional Status Survey:    Vitals:  05/19/17 0848  BP: 100/82  Pulse: 79  Resp: 18  Temp: 98.5 F (36.9 C)  TempSrc: Oral  SpO2: 96%  Weight: 141 lb 12.8 oz (64.3 kg)  Height: 5' 1"  (1.549 m)   Body mass index is 26.79 kg/m. Physical Exam  Constitutional: She is oriented to person, place, and time. Vital signs are normal. She appears well-developed and well-nourished. She is active and cooperative. She does not appear ill. No distress.  HENT:  Head: Normocephalic and atraumatic.  Mouth/Throat: Uvula is midline, oropharynx is clear and moist and mucous membranes are normal. Mucous membranes are not pale, not dry and not cyanotic.  Eyes: Pupils are equal, round, and reactive to light. Conjunctivae, EOM and lids are normal.  Neck: Trachea normal, normal range of motion and full passive range of motion without pain. Neck supple. No JVD present. No tracheal deviation, no edema and no erythema present. No thyromegaly  present.  Cardiovascular: Normal rate, regular rhythm, normal heart sounds, intact distal pulses and normal pulses. Exam reveals no gallop, no distant heart sounds and no friction rub.  No murmur heard. Pulses:      Dorsalis pedis pulses are 2+ on the right side, and 2+ on the left side.  No edema  Pulmonary/Chest: Effort normal and breath sounds normal. No accessory muscle usage. No respiratory distress. She has no decreased breath sounds. She has no wheezes. She has no rhonchi. She has no rales. She exhibits no tenderness.  Abdominal: Soft. Normal appearance and bowel sounds are normal. She exhibits no distension and no ascites. There is no tenderness.  Musculoskeletal: Normal range of motion. She exhibits no edema or tenderness.  Expected osteoarthritis, stiffness; Bilateral Calves soft, supple. Negative Homan's Sign. B- pedal pulses equal; generalized weakness  Neurological: She is alert and oriented to person, place, and time. She has normal strength. Coordination and gait abnormal.  Skin: Skin is warm, dry and intact. She is not diaphoretic. No cyanosis. No pallor. Nails show no clubbing.  Psychiatric: She has a normal mood and affect. Her speech is normal and behavior is normal. Judgment and thought content normal. Cognition and memory are impaired. She exhibits abnormal recent memory.  Nursing note and vitals reviewed.   Labs reviewed: Recent Labs    04/20/17 1505 04/23/17 1004 04/27/17 1320 05/01/17 0950  NA 133* 130* 132* 133*  K 3.6 3.2* 3.6 3.8  CL 93* 96* 98* 98*  CO2 29 23 27 23   GLUCOSE 116* 130* 98 165*  BUN 39* 26* 16 19  CREATININE 0.93 0.87 0.71 0.76  CALCIUM 10.0 8.2* 8.1* 8.1*  MG 1.8  --   --   --    Recent Labs    04/14/17 0936 04/20/17 1505 05/01/17 0950  AST 25 21 26   ALT 10* 11* 14  ALKPHOS 75 69 91  BILITOT 0.8 0.6 0.6  PROT 6.5 6.7 5.7*  ALBUMIN 3.5 3.6 3.1*   Recent Labs    04/20/17 1505 04/23/17 1004 04/27/17 1110 05/01/17 0950  WBC  19.1* 13.2* 19.3* 13.5*  NEUTROABS 17.2* 11.2* 14.4*  --   HGB 10.8* 10.3* 10.4* 10.0*  HCT 32.7* 32.2* 33.7* 32.3*  MCV 72.6* 73.2* 74.5* 75.1*  PLT 485* 477* 542* 498*   Lab Results  Component Value Date   TSH 3.269 04/20/2017   No results found for: HGBA1C Lab Results  Component Value Date   CHOL 127 04/20/2017   HDL 74 04/20/2017   LDLCALC 40 04/20/2017   TRIG 66 04/20/2017  CHOLHDL 1.7 04/20/2017    Significant Diagnostic Results in last 30 days:  No results found.  Assessment/Plan  Constipation, unspecified constipation type  Hypertensive kidney disease with CKD stage III (HCC)  Generalized weakness   Above listed conditions stable  Continue current medication regimen  Continue working with PT/OT  Continue exercises taught by PT/OT  Ambulate with assistance only  Encourage p.o. fluid intake  Renally adjust medications as appropriate   Family/ staff Communication:   Total Time:  Documentation:  Face to Face:  Family/Phone:   Labs/tests ordered: :Recent labs reviewe CBC, met C  Medication list reviewed and assessed for continued appropriateness. Monthly medication orders reviewed and signed.  Vikki Ports, NP-C Geriatrics [facility] [phone] [place],  [address] Cell Phone (Mon-Fri 8am-5pm):  [phone] On Call:  [phone] & follow prompts after 5pm & weekends Office Phone:  [phone] Office Fax:  [phone]

## 2017-05-30 ENCOUNTER — Emergency Department: Payer: Medicare Other

## 2017-05-30 ENCOUNTER — Other Ambulatory Visit: Payer: Self-pay

## 2017-05-30 ENCOUNTER — Inpatient Hospital Stay
Admission: EM | Admit: 2017-05-30 | Discharge: 2017-06-02 | DRG: 871 | Disposition: A | Discharge status: Skilled Nursing Facility | Payer: Medicare Other | Attending: Internal Medicine | Admitting: Internal Medicine

## 2017-05-30 ENCOUNTER — Encounter: Payer: Self-pay | Admitting: *Deleted

## 2017-05-30 DIAGNOSIS — I959 Hypotension, unspecified: Secondary | ICD-10-CM | POA: Diagnosis present

## 2017-05-30 DIAGNOSIS — R195 Other fecal abnormalities: Secondary | ICD-10-CM | POA: Diagnosis present

## 2017-05-30 DIAGNOSIS — D509 Iron deficiency anemia, unspecified: Secondary | ICD-10-CM | POA: Diagnosis present

## 2017-05-30 DIAGNOSIS — R5381 Other malaise: Secondary | ICD-10-CM | POA: Diagnosis present

## 2017-05-30 DIAGNOSIS — G709 Myoneural disorder, unspecified: Secondary | ICD-10-CM | POA: Diagnosis present

## 2017-05-30 DIAGNOSIS — K2100 Gastro-esophageal reflux disease with esophagitis, without bleeding: Secondary | ICD-10-CM

## 2017-05-30 DIAGNOSIS — W19XXXA Unspecified fall, initial encounter: Secondary | ICD-10-CM | POA: Diagnosis present

## 2017-05-30 DIAGNOSIS — M7989 Other specified soft tissue disorders: Secondary | ICD-10-CM | POA: Diagnosis present

## 2017-05-30 DIAGNOSIS — K228 Other specified diseases of esophagus: Secondary | ICD-10-CM | POA: Diagnosis present

## 2017-05-30 DIAGNOSIS — J449 Chronic obstructive pulmonary disease, unspecified: Secondary | ICD-10-CM | POA: Diagnosis present

## 2017-05-30 DIAGNOSIS — I447 Left bundle-branch block, unspecified: Secondary | ICD-10-CM | POA: Diagnosis present

## 2017-05-30 DIAGNOSIS — A4151 Sepsis due to Escherichia coli [E. coli]: Principal | ICD-10-CM | POA: Diagnosis present

## 2017-05-30 DIAGNOSIS — Z85820 Personal history of malignant melanoma of skin: Secondary | ICD-10-CM

## 2017-05-30 DIAGNOSIS — D62 Acute posthemorrhagic anemia: Secondary | ICD-10-CM | POA: Diagnosis present

## 2017-05-30 DIAGNOSIS — M8580 Other specified disorders of bone density and structure, unspecified site: Secondary | ICD-10-CM | POA: Diagnosis present

## 2017-05-30 DIAGNOSIS — Z66 Do not resuscitate: Secondary | ICD-10-CM | POA: Diagnosis present

## 2017-05-30 DIAGNOSIS — E039 Hypothyroidism, unspecified: Secondary | ICD-10-CM | POA: Diagnosis present

## 2017-05-30 DIAGNOSIS — K922 Gastrointestinal hemorrhage, unspecified: Secondary | ICD-10-CM

## 2017-05-30 DIAGNOSIS — D500 Iron deficiency anemia secondary to blood loss (chronic): Secondary | ICD-10-CM | POA: Diagnosis not present

## 2017-05-30 DIAGNOSIS — N170 Acute kidney failure with tubular necrosis: Secondary | ICD-10-CM | POA: Diagnosis present

## 2017-05-30 DIAGNOSIS — N183 Chronic kidney disease, stage 3 (moderate): Secondary | ICD-10-CM | POA: Diagnosis present

## 2017-05-30 DIAGNOSIS — I499 Cardiac arrhythmia, unspecified: Secondary | ICD-10-CM | POA: Diagnosis present

## 2017-05-30 DIAGNOSIS — E860 Dehydration: Secondary | ICD-10-CM | POA: Diagnosis present

## 2017-05-30 DIAGNOSIS — R531 Weakness: Secondary | ICD-10-CM | POA: Diagnosis present

## 2017-05-30 DIAGNOSIS — K5900 Constipation, unspecified: Secondary | ICD-10-CM | POA: Diagnosis present

## 2017-05-30 DIAGNOSIS — K449 Diaphragmatic hernia without obstruction or gangrene: Secondary | ICD-10-CM | POA: Diagnosis present

## 2017-05-30 DIAGNOSIS — I248 Other forms of acute ischemic heart disease: Secondary | ICD-10-CM | POA: Diagnosis present

## 2017-05-30 DIAGNOSIS — I129 Hypertensive chronic kidney disease with stage 1 through stage 4 chronic kidney disease, or unspecified chronic kidney disease: Secondary | ICD-10-CM | POA: Diagnosis present

## 2017-05-30 DIAGNOSIS — Z9049 Acquired absence of other specified parts of digestive tract: Secondary | ICD-10-CM

## 2017-05-30 DIAGNOSIS — Z85038 Personal history of other malignant neoplasm of large intestine: Secondary | ICD-10-CM

## 2017-05-30 DIAGNOSIS — Z79899 Other long term (current) drug therapy: Secondary | ICD-10-CM

## 2017-05-30 DIAGNOSIS — M79601 Pain in right arm: Secondary | ICD-10-CM

## 2017-05-30 DIAGNOSIS — M810 Age-related osteoporosis without current pathological fracture: Secondary | ICD-10-CM | POA: Diagnosis present

## 2017-05-30 DIAGNOSIS — N390 Urinary tract infection, site not specified: Secondary | ICD-10-CM | POA: Diagnosis present

## 2017-05-30 DIAGNOSIS — R609 Edema, unspecified: Secondary | ICD-10-CM

## 2017-05-30 DIAGNOSIS — Z7982 Long term (current) use of aspirin: Secondary | ICD-10-CM

## 2017-05-30 DIAGNOSIS — K5730 Diverticulosis of large intestine without perforation or abscess without bleeding: Secondary | ICD-10-CM | POA: Diagnosis present

## 2017-05-30 DIAGNOSIS — D649 Anemia, unspecified: Secondary | ICD-10-CM

## 2017-05-30 DIAGNOSIS — R6520 Severe sepsis without septic shock: Secondary | ICD-10-CM | POA: Diagnosis present

## 2017-05-30 DIAGNOSIS — Z8719 Personal history of other diseases of the digestive system: Secondary | ICD-10-CM

## 2017-05-30 DIAGNOSIS — D638 Anemia in other chronic diseases classified elsewhere: Secondary | ICD-10-CM | POA: Diagnosis present

## 2017-05-30 LAB — COMPREHENSIVE METABOLIC PANEL
ALBUMIN: 2.8 g/dL — AB (ref 3.5–5.0)
ALT: 11 U/L — AB (ref 14–54)
AST: 45 U/L — AB (ref 15–41)
Alkaline Phosphatase: 54 U/L (ref 38–126)
Anion gap: 16 — ABNORMAL HIGH (ref 5–15)
BUN: 43 mg/dL — AB (ref 6–20)
CHLORIDE: 100 mmol/L — AB (ref 101–111)
CO2: 17 mmol/L — AB (ref 22–32)
CREATININE: 1.08 mg/dL — AB (ref 0.44–1.00)
Calcium: 8.5 mg/dL — ABNORMAL LOW (ref 8.9–10.3)
GFR calc Af Amer: 50 mL/min — ABNORMAL LOW (ref >60)
GFR, EST NON AFRICAN AMERICAN: 43 mL/min — AB (ref >60)
GLUCOSE: 119 mg/dL — AB (ref 65–99)
Potassium: 4.6 mmol/L (ref 3.5–5.1)
SODIUM: 133 mmol/L — AB (ref 135–145)
Total Bilirubin: 0.7 mg/dL (ref 0.3–1.2)
Total Protein: 5.6 g/dL — ABNORMAL LOW (ref 6.5–8.1)

## 2017-05-30 LAB — URINALYSIS, COMPLETE (UACMP) WITH MICROSCOPIC
BILIRUBIN URINE: NEGATIVE
GLUCOSE, UA: NEGATIVE mg/dL
HGB URINE DIPSTICK: NEGATIVE
Ketones, ur: NEGATIVE mg/dL
Nitrite: POSITIVE — AB
PROTEIN: NEGATIVE mg/dL
Specific Gravity, Urine: 1.014 (ref 1.005–1.030)
pH: 5 (ref 5.0–8.0)

## 2017-05-30 LAB — CBC WITH DIFFERENTIAL/PLATELET
Basophils Absolute: 0.3 10*3/uL — ABNORMAL HIGH (ref 0–0.1)
Basophils Relative: 1 %
Eosinophils Absolute: 0.3 10*3/uL (ref 0–0.7)
Eosinophils Relative: 1 %
HEMATOCRIT: 16.2 % — AB (ref 35.0–47.0)
Hemoglobin: 4.8 g/dL — CL (ref 12.0–16.0)
LYMPHS ABS: 1 10*3/uL (ref 1.0–3.6)
Lymphocytes Relative: 3 %
MCH: 23.5 pg — ABNORMAL LOW (ref 26.0–34.0)
MCHC: 29.7 g/dL — AB (ref 32.0–36.0)
MCV: 79.2 fL — AB (ref 80.0–100.0)
MONO ABS: 2.1 10*3/uL — AB (ref 0.2–0.9)
MONOS PCT: 6 %
NEUTROS ABS: 31.1 10*3/uL — AB (ref 1.4–6.5)
Neutrophils Relative %: 89 %
PLATELETS: 357 10*3/uL (ref 150–440)
RBC: 2.05 MIL/uL — AB (ref 3.80–5.20)
RDW: 20.7 % — AB (ref 11.5–14.5)
WBC: 34.8 10*3/uL — AB (ref 3.6–11.0)

## 2017-05-30 LAB — PREPARE RBC (CROSSMATCH)

## 2017-05-30 LAB — LACTIC ACID, PLASMA: LACTIC ACID, VENOUS: 1.9 mmol/L (ref 0.5–1.9)

## 2017-05-30 LAB — IRON AND TIBC
IRON: 13 ug/dL — AB (ref 28–170)
SATURATION RATIOS: 4 % — AB (ref 10.4–31.8)
TIBC: 353 ug/dL (ref 250–450)
UIBC: 340 ug/dL

## 2017-05-30 LAB — RETICULOCYTES
RBC.: 2.97 MIL/uL — ABNORMAL LOW (ref 3.80–5.20)
RETIC COUNT ABSOLUTE: 190.1 10*3/uL — AB (ref 19.0–183.0)
Retic Ct Pct: 6.4 % — ABNORMAL HIGH (ref 0.4–3.1)

## 2017-05-30 LAB — TROPONIN I
Troponin I: 0.03 ng/mL (ref <0.03)
Troponin I: 0.04 ng/mL (ref <0.03)

## 2017-05-30 LAB — FERRITIN: FERRITIN: 11 ng/mL (ref 11–307)

## 2017-05-30 LAB — HEMOGLOBIN AND HEMATOCRIT, BLOOD
HEMATOCRIT: 23.9 % — AB (ref 35.0–47.0)
Hemoglobin: 7.8 g/dL — ABNORMAL LOW (ref 12.0–16.0)

## 2017-05-30 LAB — FOLATE: FOLATE: 12.9 ng/mL (ref >5.9)

## 2017-05-30 MED ORDER — VITAMIN D 1000 UNITS PO TABS
2000.0000 [IU] | ORAL_TABLET | Freq: Every day | ORAL | Status: DC
  Administered 2017-06-02: 2000 [IU] via ORAL
  Filled 2017-05-30 (×2): qty 2

## 2017-05-30 MED ORDER — FLUTICASONE PROPIONATE 50 MCG/ACT NA SUSP
2.0000 | Freq: Every day | NASAL | Status: DC
  Administered 2017-05-31: 2 via NASAL
  Filled 2017-05-30: qty 16

## 2017-05-30 MED ORDER — SODIUM CHLORIDE 0.9 % IV BOLUS
1000.0000 mL | Freq: Once | INTRAVENOUS | Status: AC
  Administered 2017-05-30: 1000 mL via INTRAVENOUS

## 2017-05-30 MED ORDER — PANTOPRAZOLE SODIUM 40 MG IV SOLR
40.0000 mg | Freq: Once | INTRAVENOUS | Status: AC
  Administered 2017-05-30: 40 mg via INTRAVENOUS
  Filled 2017-05-30: qty 40

## 2017-05-30 MED ORDER — ONDANSETRON HCL 4 MG/2ML IJ SOLN: 4.0000 mg | Freq: Four times a day (QID) | INTRAMUSCULAR | Status: DC | PRN

## 2017-05-30 MED ORDER — ACETAMINOPHEN 325 MG PO TABS: 650.0000 mg | ORAL_TABLET | Freq: Four times a day (QID) | ORAL | Status: DC | PRN

## 2017-05-30 MED ORDER — SODIUM CHLORIDE 0.9 % IV SOLN
INTRAVENOUS | Status: DC
  Administered 2017-05-30 – 2017-06-02 (×4): via INTRAVENOUS

## 2017-05-30 MED ORDER — LACTULOSE 10 GM/15ML PO SOLN: 30.0000 g | Freq: Two times a day (BID) | ORAL | Status: DC | PRN

## 2017-05-30 MED ORDER — BACID PO TABS
1.0000 | ORAL_TABLET | Freq: Two times a day (BID) | ORAL | Status: DC
  Administered 2017-05-30: 1 via ORAL
  Filled 2017-05-30 (×3): qty 1

## 2017-05-30 MED ORDER — DOCUSATE SODIUM 100 MG PO CAPS: 100.0000 mg | ORAL_CAPSULE | Freq: Every day | ORAL | Status: DC | PRN

## 2017-05-30 MED ORDER — SODIUM CHLORIDE 0.9 % IV SOLN
1.0000 g | Freq: Once | INTRAVENOUS | Status: AC
  Administered 2017-05-30: 1 g via INTRAVENOUS
  Filled 2017-05-30: qty 10

## 2017-05-30 MED ORDER — SODIUM CHLORIDE 0.9 % IV SOLN
10.0000 mL/h | Freq: Once | INTRAVENOUS | Status: AC
  Administered 2017-05-30: 10 mL/h via INTRAVENOUS

## 2017-05-30 MED ORDER — ONDANSETRON HCL 4 MG PO TABS: 4.0000 mg | ORAL_TABLET | Freq: Four times a day (QID) | ORAL | Status: DC | PRN

## 2017-05-30 MED ORDER — VITAMIN B-12 1000 MCG PO TABS
2000.0000 ug | ORAL_TABLET | Freq: Every day | ORAL | Status: DC
  Administered 2017-06-02: 2000 ug via ORAL
  Filled 2017-05-30: qty 2

## 2017-05-30 MED ORDER — SODIUM CHLORIDE 0.9 % IV SOLN
1.0000 g | Freq: Two times a day (BID) | INTRAVENOUS | Status: DC
  Administered 2017-05-30 – 2017-06-02 (×7): 1 g via INTRAVENOUS
  Filled 2017-05-30 (×10): qty 1

## 2017-05-30 NOTE — ED Notes (Signed)
Attempted IV access in Left AC after Left forearm IV infiltrated unsuccessful x 1

## 2017-05-30 NOTE — Clinical Social Work Note (Signed)
Clinical Social Work Assessment  Patient Details  Name: Beth Chen MRN: [id number] Date of Birth: 02-02-1922  Date of referral:  05/30/17               Reason for consult:  Other (Comment Required)(From [facility])                Permission sought to share information with:  Guardian, PCP, Family Supports, Chartered certified accountant granted to share information::  Yes, Verbal Permission Granted  Name::     Beth Chen and POA [phone]  Agency::  [facility]  Relationship::     Contact Information:     Housing/Transportation Living arrangements for the past 2 months:  [place] of Information:  Patient, Adult Children, Power of Forensic psychologist Patient Interpreter Needed:  None Criminal Activity/Legal Involvement Pertinent to Current Situation/Hospitalization:  No - Comment as needed Significant Relationships:  Adult Children, Other Family Members Lives with:  Facility Resident Do you feel safe going back to the place where you live?  Yes Need for family participation in patient care:  Yes (Comment)  Care giving concerns: daughter hopes her mother will get stronger   Social Worker assessment / plan: LCSW introduced myself to patient 84 year old female oriented x4 who has come to hospital with infection.  Beth Chen-HCPOA and [place]. Please reach her on her cell phone. Her mother uses a wheel chair and requires full assistance with all her ADL's and can feed herself. Her mother has been at [facility] since Feb 2019 and will return once discharged. LCSW will start fl2 . Patient has excellent family support and involvement. Reviewed good self care practices for caregiver ( daughter) Patient is hard of hearing and poor vision. Insurance is [place] Psychiatric nurse.  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) PT Recommendations:   TBD Information / Referral to community resources:      Patient/Family's Response to care:  Good understanding  Patient/Family's Understanding of and Emotional Response to Diagnosis, Current Treatment, and Prognosis: Patient aware she is not feeling well and glad to be in hospital  Emotional Assessment Appearance:    Attitude/Demeanor/Rapport:  Engaged, Gracious Affect (typically observed):  Accepting, Calm Orientation:  Oriented to Self, Oriented to Place, Oriented to  Time, Oriented to Situation Alcohol / Substance use:  Not Applicable Psych involvement (Current and /or in the community):     Discharge Needs  Concerns to be addressed:  No discharge needs identified Readmission within the last 30 days:  No Current discharge risk:  None Barriers to Discharge:  No Barriers Identified   [name], LCSW 05/30/2017, 3:50 PM

## 2017-05-30 NOTE — ED Notes (Signed)
Unable to hang Meropenem due to blood products hanging.  IV team in room to try to place a second IV.

## 2017-05-30 NOTE — ED Notes (Signed)
Blood products started at this time, verified with Earley Favor.

## 2017-05-30 NOTE — ED Notes (Addendum)
IV team in room to place another IV. Unable to obtain labs at this time. Lab will be notified to come to the floor to draw labs.

## 2017-05-30 NOTE — ED Notes (Signed)
SW talking to patient's daughter

## 2017-05-30 NOTE — Progress Notes (Signed)
Pharmacy Antibiotic Note  Beth Chen is a 84 y.o. female admitted on 05/30/2017 with bacteremia.  Pharmacy has been consulted for merrem dosing.  Plan: merrem 1gm iv q12h   Height: 5\' 1"  (154.9 cm) Weight: 141 lb (64 kg) IBW/kg (Calculated) : 47.8  Temp (24hrs), Avg:97.8 F (36.6 C), Min:97.8 F (36.6 C), Max:97.8 F (36.6 C)  Recent Labs  Lab 05/30/17 1127  WBC 34.8*  CREATININE 1.08*    Estimated Creatinine Clearance: 27.9 mL/min (A) (by C-G formula based on SCr of 1.08 mg/dL (H)).    No Known Allergies  Antimicrobials this admission: Anti-infectives (From admission, onward)   Start     Dose/Rate Route Frequency Ordered Stop   05/30/17 1445  meropenem (MERREM) 1 g in sodium chloride 0.9 % 100 mL IVPB     1 g 200 mL/hr over 30 Minutes Intravenous Every 12 hours 05/30/17 1435     05/30/17 1245  cefTRIAXone (ROCEPHIN) 1 g in sodium chloride 0.9 % 100 mL IVPB     1 g 200 mL/hr over 30 Minutes Intravenous  Once 05/30/17 1244 05/30/17 1436      Microbiology results: No results found for this or any previous visit (from the past 240 hour(s)).   Thank you for allowing pharmacy to be a part of this patient's care.  Donna Christen [name] 05/30/2017 2:36 PM

## 2017-05-30 NOTE — NC FL2 (Signed)
[place] LEVEL OF CARE SCREENING TOOL     IDENTIFICATION  Patient Name: Beth Chen Birthdate: [birth year] Sex: female Admission Date (Current Location): 05/30/2017  [place] and [place] Number:  Engineering geologist and Address:  [facility], [address] [place], [place] [address]      Provider Number: [id number]  Attending Physician Name and Address:  Gladstone Lighter, MD  Relative Name and Phone Number:       Current Level of Care: Hospital Recommended Level of Care: [place] Prior Approval Number:    Date Approved/Denied:   PASRR Number: [id number]  Discharge Plan: SNF    Current Diagnoses: Patient Active Problem List   Diagnosis Date Noted  . Anemia 05/30/2017  . Generalized weakness 05/20/2017  . Lower GI bleed 09/07/2016  . GI bleed 09/05/2016  . Allergic rhinitis 11/18/2013  . Asthma with COPD (chronic obstructive pulmonary disease) ([place]) 11/18/2013  . Borderline hypothyroidism 11/18/2013  . Complete left bundle branch block (LBBB) 11/18/2013  . Constipation 11/18/2013  . Hypertensive kidney disease with CKD stage III ([place]) 11/18/2013    Orientation RESPIRATION BLADDER Height & Weight     Self, Time, Place  Normal Incontinent Weight: 141 lb (64 kg) Height:  5\' 1"  (154.9 cm)  BEHAVIORAL SYMPTOMS/MOOD NEUROLOGICAL BOWEL NUTRITION STATUS  (none) (None) Incontinent Diet(Normal)  AMBULATORY STATUS COMMUNICATION OF NEEDS Skin   Extensive Assist Verbally Normal                       Personal Care Assistance Level of Assistance  Bathing, Dressing, Feeding Bathing Assistance: Limited assistance Feeding assistance: Independent Dressing Assistance: Limited assistance     Functional Limitations Info  Sight, Hearing, Speech Sight Info: Impaired Hearing Info: Impaired(Hard of hearing)      SPECIAL CARE FACTORS FREQUENCY  PT (By licensed PT)                    Contractures  Contractures Info: Not present    Additional Factors Info  Code Status Code Status Info: DNR             Current Medications (05/30/2017):  This is the current hospital active medication list Current Facility-Administered Medications  Medication Dose Route Frequency Provider Last Rate Last Dose  . meropenem (MERREM) 1 g in sodium chloride 0.9 % 100 mL IVPB  1 g Intravenous Q12H Coffee, Donna Christen, [facility]       Current Outpatient Medications  Medication Sig Dispense Refill  . acetaminophen (TYLENOL) 325 MG tablet Take 650 mg by mouth every 4 (four) hours as needed. for pain/ increased temp. May be administered orally, per G-tube if needed or rectally if unable to swallow (separate order). Maximum dose for 24 hours is 3,000 mg from all sources of Acetaminophen/ Tylenol    . aspirin EC 81 MG tablet Take 81 mg by mouth at bedtime. Heart Health- per home regimen    . bisacodyl (DULCOLAX) 10 MG suppository Place 10 mg rectally 2 (two) times daily as needed.    . calcium carbonate (TUMS EX) 750 MG chewable tablet Chew 2 tablets by mouth daily.     . Cholecalciferol (VITAMIN D-1000 MAX ST) 1000 units tablet Take 2,000 Units by mouth daily.     . cyanocobalamin 1000 MCG tablet Take 2,000 mcg by mouth daily.     Marland Kitchen docusate sodium (COLACE) 100 MG capsule Take 1 capsule (100 mg total) by mouth daily as needed. 30 capsule  0  . fluticasone (FLONASE) 50 MCG/ACT nasal spray USE 2 SPRAYS IN EACH       NOSTRIL DAILY    . lactulose (CHRONULAC) 10 GM/15ML solution Take 30 g by mouth 2 (two) times daily as needed.    . polyethylene glycol (MIRALAX / GLYCOLAX) packet Take 17 g by mouth daily.    . Probiotic Product (RISA-BID PROBIOTIC) TABS Take 1 tablet by mouth 2 (two) times daily.       Discharge Medications: Please see discharge summary for a list of discharge medications.  Relevant Imaging Results:  Relevant Lab Results:   Additional Information [id number]  Beth Chen, [place]

## 2017-05-30 NOTE — ED Notes (Signed)
IV team had 2 unsuccessful attempts at IV access.  Greg RN in room to attempt IV access.

## 2017-05-30 NOTE — ED Notes (Signed)
Blood transfusion consent form signed by daughter, pt unable to see form. Pt verbally consented with MD at bedside, this RN as witness.

## 2017-05-30 NOTE — H&P (Addendum)
[facility] NAME: Beth Chen    MR#:  [id number]  DATE OF BIRTH:  15-Jan-1922  DATE OF ADMISSION:  05/30/2017  PRIMARY CARE PHYSICIAN: Kirk Ruths, MD   REQUESTING/REFERRING PHYSICIAN: Dr. Conni Slipper  CHIEF COMPLAINT:   Chief Complaint  Patient presents with  . Fatigue    HISTORY OF PRESENT ILLNESS:  Beth Chen  is a 84 y.o. female with a known history of colon cancer status post right hemicolectomy, diverticulosis, hypertension, hypothyroidism, osteoporosis with recent admission to the hospital about 4 weeks ago for lower GI bleed presents to the hospital again secondary to severe weakness and a fall. Patient has been complaining of weakness for the last couple of days.  Had a fall yesterday.  Noted to be hypotensive at the rehab today with systolic blood pressure in the 70s and was tachycardic.  Sent to the emergency room and noted to have UTI and acute on chronic anemia. Patient had an admission for diverticular bleed last year and received transfusion for hemoglobin of 7.3.  She has not noticed any dark stools or bloody stools recently.  Now, Stool is strongly positive for guaiac she had black stool here.  Her hemoglobin today is 4.8.  She denies any abdominal pain, nausea or vomiting.  Patient has been extremely weak over the past week especially in the last couple of days.  She was treated for UTI last month and had Pseudomonas in her urine.  Prior to that she had E. coli in her urine.  Her white count today is 34.8.  No fevers or chills.  Family at bedside.  PAST MEDICAL HISTORY:   Past Medical History:  Diagnosis Date  . Allergic rhinitis 11/18/2013  . Asthma with COPD (chronic obstructive pulmonary disease) ([place]) 11/18/2013   Last Assessment & Plan:  No flair ups noted recently and breathing is essentially at baseline.    . Asthma without status asthmaticus    unspecified  . Borderline hypothyroidism 11/18/2013   Last Assessment & Plan:  Energy and tsh are doing well  . Cancer [facility])    colon cancer- surgically removed in 2006  . Cholelithiases   . Chronic kidney disease   . Colon cancer ([place])   . Complete left bundle branch block (LBBB) 11/18/2013  . Constipation 11/18/2013  . Diverticulosis   . Hiatal hernia    right  . HLD (hyperlipidemia) 11/18/2013   Last Assessment & Plan:  Appropriate diet is followed and no myalgia's are noted.    . Hyperlipidemia, unspecified   . Hypertension   . Hypertensive kidney disease with CKD stage III ([place]) 11/18/2013   Last Assessment & Plan:  Is compliant with hypertensive medications without clear side effects or lack of control.  Nausea and itching are not symptomatic and nsaids are being avoided.    . Melanoma ([place])   . Osteopenia     PAST SURGICAL HISTORY:   Past Surgical History:  Procedure Laterality Date  . ABDOMINAL HYSTERECTOMY    . Bladder Lift    . BREAST EXCISIONAL BIOPSY Left 15+ yrs ago   neg  . COLON RESECTION  2006   colon cancer  . COLON SURGERY    . COLON SURGERY Right   . SKIN CANCER EXCISION  2006   melanoma    SOCIAL HISTORY:   Social History   Tobacco Use  . Smoking status: Never Smoker  . Smokeless tobacco: Never Used  Substance Use  Topics  . Alcohol use: No    FAMILY HISTORY:   Family History  Problem Relation Age of Onset  . Breast cancer Daughter 110       passed at 38 from breast cancer  . Heart disease Mother   . Heart failure Mother   . Lung cancer Father     DRUG ALLERGIES:  No Known Allergies  REVIEW OF SYSTEMS:   Review of Systems  Constitutional: Positive for malaise/fatigue. Negative for chills, fever and weight loss.  HENT: Positive for hearing loss. Negative for ear discharge, ear pain and nosebleeds.   Eyes: Negative for blurred vision, double vision and photophobia.  Respiratory: Positive for shortness of breath. Negative for cough, hemoptysis and wheezing.   Cardiovascular: Negative for  chest pain, palpitations, orthopnea and leg swelling.  Gastrointestinal: Positive for melena. Negative for abdominal pain, constipation, diarrhea, heartburn, nausea and vomiting.  Genitourinary: Negative for dysuria, frequency and urgency.  Musculoskeletal: Negative for myalgias and neck pain.  Skin: Negative for rash.  Neurological: Positive for weakness. Negative for dizziness, sensory change, speech change, focal weakness and headaches.  Endo/Heme/Allergies: Does not bruise/bleed easily.  Psychiatric/Behavioral: Negative for depression.    MEDICATIONS AT HOME:   Prior to Admission medications   Medication Sig Start Date End Date Taking? Authorizing Provider  acetaminophen (TYLENOL) 325 MG tablet Take 650 mg by mouth every 4 (four) hours as needed. for pain/ increased temp. May be administered orally, per G-tube if needed or rectally if unable to swallow (separate order). Maximum dose for 24 hours is 3,000 mg from all sources of Acetaminophen/ Tylenol   Yes [provider]  aspirin EC 81 MG tablet Take 81 mg by mouth at bedtime. Heart Health- per home regimen   Yes [provider]  bisacodyl (DULCOLAX) 10 MG suppository Place 10 mg rectally 2 (two) times daily as needed.   Yes [provider]  calcium carbonate (TUMS EX) 750 MG chewable tablet Chew 2 tablets by mouth daily.    Yes [provider]  Cholecalciferol (VITAMIN D-1000 MAX ST) 1000 units tablet Take 2,000 Units by mouth daily.  12/10/15  Yes [provider]  cyanocobalamin 1000 MCG tablet Take 2,000 mcg by mouth daily.    Yes [provider]  docusate sodium (COLACE) 100 MG capsule Take 1 capsule (100 mg total) by mouth daily as needed. 04/14/17 04/14/18 Yes McShane, Gerda Diss, MD  fluticasone (FLONASE) 50 MCG/ACT nasal spray USE 2 SPRAYS IN EACH       NOSTRIL DAILY 11/19/15  Yes [provider]  lactulose (CHRONULAC) 10 GM/15ML solution Take 30 g by mouth 2 (two) times daily  as needed.   Yes [provider]  polyethylene glycol (MIRALAX / GLYCOLAX) packet Take 17 g by mouth daily.   Yes [provider]  Probiotic Product (RISA-BID PROBIOTIC) TABS Take 1 tablet by mouth 2 (two) times daily.   Yes [provider]      VITAL SIGNS:  Blood pressure 99/70, pulse 82, temperature 97.8 F (36.6 C), temperature source Oral, resp. rate 18, height 5\' 1"  (1.549 m), weight 64 kg (141 lb), SpO2 100 %.  PHYSICAL EXAMINATION:   Physical Exam  GENERAL:  84 y.o.-year-old elderly patient lying in the bed with no acute distress. Hard of hearing EYES: Pupils equal, round, reactive to light and accommodation. No scleral icterus. Extraocular muscles intact. Pale conjunctiva HEENT: Head atraumatic, normocephalic. Oropharynx and nasopharynx clear.  NECK:  Supple, no jugular venous distention. No  thyroid enlargement, no tenderness.  LUNGS: Normal breath sounds bilaterally, no wheezing, rales,rhonchi or crepitation. No use of accessory muscles of respiration. Decreased at the bases CARDIOVASCULAR: S1, S2 normal. No  rubs, or gallops. Loud 3/6 systolic murmur present ABDOMEN: Soft, nontender, nondistended. Bowel sounds present. No organomegaly or mass.  EXTREMITIES: No pedal edema, cyanosis, or clubbing.  NEUROLOGIC: Cranial nerves II through XII are intact. Muscle strength 5/5 in all extremities. Sensation intact. Gait not checked. Globally weak PSYCHIATRIC: The patient is alert and oriented x 3.  SKIN: No obvious rash, lesion, or ulcer.   LABORATORY PANEL:   CBC Recent Labs  Lab 05/30/17 1127  WBC 34.8*  HGB 4.8*  HCT 16.2*  PLT 357   ------------------------------------------------------------------------------------------------------------------  Chemistries  Recent Labs  Lab 05/30/17 1127  NA 133*  K 4.6  CL 100*  CO2 17*  GLUCOSE 119*  BUN 43*  CREATININE 1.08*  CALCIUM 8.5*  AST 45*  ALT 11*  ALKPHOS 54  BILITOT 0.7    ------------------------------------------------------------------------------------------------------------------  Cardiac Enzymes Recent Labs  Lab 05/30/17 1127  TROPONINI 0.03*   ------------------------------------------------------------------------------------------------------------------  RADIOLOGY:  Dg Chest Portable 1 View  Result Date: 05/30/2017 CLINICAL DATA:  weakness.  Colon cancer. EXAM: PORTABLE CHEST 1 VIEW COMPARISON:  Acute abdomen series at 23:19 FINDINGS: Remote right rib fractures. The Chin overlies the apices minimally. Suggestion of hyperinflation. Midline trachea. Mild cardiomegaly. No pleural effusion or pneumothorax. No congestive failure. Mild left base scarring. IMPRESSION: Cardiomegaly and probable hyperinflation.  No acute findings. Electronically Signed   By: Abigail Miyamoto M.D.   On: 05/30/2017 12:27    EKG:   Orders placed or performed during the hospital encounter of 05/30/17  . ED EKG  . ED EKG  . EKG 12-Lead  . EKG 12-Lead  . EKG 12-Lead  . EKG 12-Lead    IMPRESSION AND PLAN:   Beth Chen  is a 84 y.o. female with a known history of colon cancer status post right hemicolectomy, diverticulosis, hypertension, hypothyroidism, osteoporosis with recent admission to the hospital about 4 weeks ago for lower GI bleed presents to the hospital again secondary to severe weakness and a fall.  1. Acute on chronic anemia- likely source GI, no active bleeding now - stool strongly guiaic positive -Clear liquids for now.  2 units packed RBC transfusion requested -Anemia panel requested.  GI consult for possible upper GI endoscopy -Discontinue aspirin -Hemoglobin check every 8 hours.  If active bleeding is suspected- do a  bleeding scan.  2. Sepsis- hypotension and elevated white cell count.  Follow blood cultures and urine cultures -Has had significant UTIs with E. coli and Pseudomonas in the past.  Will treat with meropenem until cultures are back.  3.  ARF- ATN from sepsis, IV fluids and monitor  4. Elevated troponin- demand ischemia, mild elevated, recycle troponins - could be from sepsis  5. DVT prophylaxis- TEDs and SCDs only    All the records are reviewed and case discussed with ED provider. Management plans discussed with the patient, family and they are in agreement.  CODE STATUS: DNR  TOTAL TIME TAKING CARE OF THIS PATIENT: 55 minutes.    Gladstone Lighter M.D on 05/30/2017 at 2:39 PM  Between 7am to 6pm - Pager - [phone]  After 6pm go to [email or web address] - password [email or web address] [facility]  Office  [phone]  CC: Primary care physician; Kirk Ruths, MD

## 2017-05-30 NOTE — ED Triage Notes (Signed)
Pt to ED from [facility] after staff reports increased fatigue and "not acting right" foir the past couple days. Pt is able to ambulate with a walker at baseline and staff reports today pt was unable to ambulate to the bathroom. Hx of UTIs in the past. No reported fevers (97.9)   [facility] also rpeorting hypotension (73/50) EMS reports BP of 106/55. Pt is alert upon arrival.

## 2017-05-30 NOTE — ED Provider Notes (Addendum)
[facility] Emergency Department Provider Note   ____________________________________________   First MD Initiated Contact with Patient 05/30/17 1118     (approximate)  I have reviewed the triage vital signs and the nursing notes.   HISTORY  Chief Complaint Fatigue    HPI Beth Chen is a 84 y.o. female Patient comes from [facility] with a history of feeling very tired not able to walk today as she usually does. Urine smells foul and she reportedly has a bad color. This means her skin is a bad color. No reported fever. [facility] reported blood pressure 73/50. EMS reports it was 106/55 here it's 132 systolic. Patient denies any coughing shortness of breath chest pain nausea vomiting diarrhea or other complaints. She says she is just weak.   Past Medical History:  Diagnosis Date  . Allergic rhinitis 11/18/2013  . Asthma with COPD (chronic obstructive pulmonary disease) ([place]) 11/18/2013   Last Assessment & Plan:  No flair ups noted recently and breathing is essentially at baseline.    . Asthma without status asthmaticus    unspecified  . Borderline hypothyroidism 11/18/2013   Last Assessment & Plan:  Energy and tsh are doing well  . Cancer [facility])    colon cancer- surgically removed in 2006  . Cholelithiases   . Chronic kidney disease   . Colon cancer ([place])   . Complete left bundle branch block (LBBB) 11/18/2013  . Constipation 11/18/2013  . Diverticulosis   . Hiatal hernia    right  . HLD (hyperlipidemia) 11/18/2013   Last Assessment & Plan:  Appropriate diet is followed and no myalgia's are noted.    . Hyperlipidemia, unspecified   . Hypertension   . Hypertensive kidney disease with CKD stage III ([place]) 11/18/2013   Last Assessment & Plan:  Is compliant with hypertensive medications without clear side effects or lack of control.  Nausea and itching are not symptomatic and nsaids are being avoided.    . Melanoma ([place])   . Osteopenia     Patient Active  Problem List   Diagnosis Date Noted  . Generalized weakness 05/20/2017  . Lower GI bleed 09/07/2016  . GI bleed 09/05/2016  . Allergic rhinitis 11/18/2013  . Asthma with COPD (chronic obstructive pulmonary disease) ([place]) 11/18/2013  . Borderline hypothyroidism 11/18/2013  . Complete left bundle branch block (LBBB) 11/18/2013  . Constipation 11/18/2013  . Hypertensive kidney disease with CKD stage III ([place]) 11/18/2013    Past Surgical History:  Procedure Laterality Date  . ABDOMINAL HYSTERECTOMY    . Bladder Lift    . BREAST EXCISIONAL BIOPSY Left 15+ yrs ago   neg  . COLON RESECTION  2006   colon cancer  . COLON SURGERY    . COLON SURGERY Right   . SKIN CANCER EXCISION  2006   melanoma    Prior to Admission medications   Medication Sig Start Date End Date Taking? Authorizing Provider  acetaminophen (TYLENOL) 325 MG tablet Take 650 mg by mouth every 4 (four) hours as needed. for pain/ increased temp. May be administered orally, per G-tube if needed or rectally if unable to swallow (separate order). Maximum dose for 24 hours is 3,000 mg from all sources of Acetaminophen/ Tylenol   Yes [provider]  aspirin EC 81 MG tablet Take 81 mg by mouth at bedtime. Heart Health- per home regimen   Yes [provider]  bisacodyl (DULCOLAX) 10 MG suppository Place 10 mg rectally 2 (two) times daily as needed.  Yes [provider]  calcium carbonate (TUMS EX) 750 MG chewable tablet Chew 2 tablets by mouth daily.    Yes [provider]  Cholecalciferol (VITAMIN D-1000 MAX ST) 1000 units tablet Take 2,000 Units by mouth daily.  12/10/15  Yes [provider]  cyanocobalamin 1000 MCG tablet Take 2,000 mcg by mouth daily.    Yes [provider]  docusate sodium (COLACE) 100 MG capsule Take 1 capsule (100 mg total) by mouth daily as needed. 04/14/17 04/14/18 Yes McShane, Gerda Diss, MD  fluticasone (FLONASE) 50 MCG/ACT nasal spray USE 2 SPRAYS IN EACH        NOSTRIL DAILY 11/19/15  Yes [provider]  lactulose (CHRONULAC) 10 GM/15ML solution Take 30 g by mouth 2 (two) times daily as needed.   Yes [provider]  polyethylene glycol (MIRALAX / GLYCOLAX) packet Take 17 g by mouth daily.   Yes [provider]  Probiotic Product (RISA-BID PROBIOTIC) TABS Take 1 tablet by mouth 2 (two) times daily.   Yes [provider]    Allergies Patient has no known allergies.  Family History  Problem Relation Age of Onset  . Breast cancer Daughter 96       passed at 58 from breast cancer  . Heart disease Mother   . Heart failure Mother   . Lung cancer Father     Social History Social History   Tobacco Use  . Smoking status: Never Smoker  . Smokeless tobacco: Never Used  Substance Use Topics  . Alcohol use: No  . Drug use: Not on file    Review of Systems  Constitutional: No fever/chills Eyes: No visual changes. ENT: No sore throat. Cardiovascular: Denies chest pain. Respiratory: Denies shortness of breath. Gastrointestinal: No abdominal pain.  No nausea, no vomiting.  No diarrhea.  No constipation. Genitourinary: Negative for dysuria. Musculoskeletal: Negative for back pain. Skin: Negative for rash. Neurological: Negative for headaches, focal weakness   ____________________________________________   PHYSICAL EXAM:  VITAL SIGNS: ED Triage Vitals  Enc Vitals Group     BP 05/30/17 1117 101/62     Pulse Rate 05/30/17 1117 92     Resp 05/30/17 1117 18     Temp 05/30/17 1117 97.8 F (36.6 C)     Temp Source 05/30/17 1117 Oral     SpO2 05/30/17 1117 100 %     Weight 05/30/17 1118 141 lb (64 kg)     Height 05/30/17 1118 5\' 1"  (1.549 m)     Head Circumference --      Peak Flow --      Pain Score 05/30/17 1118 0     Pain Loc --      Pain Edu? --      Excl. in [place]? --     Constitutional: Alert and oriented. Well appearing and in no acute distress. Eyes: Conjunctivae are normal.  Head:  Atraumatic. Nose: No congestion/rhinnorhea. Mouth/Throat: Mucous membranes are moist.  Oropharynx non-erythematous. Neck: No stridor.   Cardiovascular: Normal rate, regular rhythm. Grossly normal heart sounds.  Good peripheral circulation. Respiratory: Normal respiratory effort.  No retractions. Lungs CTAB. Gastrointestinal: Soft and nontender. No distention. No abdominal bruits. No CVA tenderness.  Musculoskeletal: No lower extremity tenderness trace edema. Multiple bruises.small dressing on the left leg no surrounding erythema. Neurologic:  Normal speech and language. No gross focal neurologic deficits are appreciated.  Skin:  Skin is warm, dry and intact. No rash noted. Psychiatric: Mood and affect are normal. Speech  and behavior are normal. rectal: Normal rectal exam stool is black and Hemoccult positive____________________________________________   LABS (all labs ordered are listed, but only abnormal results are displayed)  Labs Reviewed  COMPREHENSIVE METABOLIC PANEL - Abnormal; Notable for the following components:      Result Value   Sodium 133 (*)    Chloride 100 (*)    CO2 17 (*)    Glucose, Bld 119 (*)    BUN 43 (*)    Creatinine, Ser 1.08 (*)    Calcium 8.5 (*)    Total Protein 5.6 (*)    Albumin 2.8 (*)    AST 45 (*)    ALT 11 (*)    GFR calc non Af Amer 43 (*)    GFR calc Af Amer 50 (*)    Anion gap 16 (*)    All other components within normal limits  TROPONIN I - Abnormal; Notable for the following components:   Troponin I 0.03 (*)    All other components within normal limits  URINALYSIS, COMPLETE (UACMP) WITH MICROSCOPIC - Abnormal; Notable for the following components:   Color, Urine YELLOW (*)    APPearance HAZY (*)    Nitrite POSITIVE (*)    Leukocytes, UA SMALL (*)    Bacteria, UA MANY (*)    Squamous Epithelial / LPF 0-5 (*)    All other components within normal limits  CBC WITH DIFFERENTIAL/PLATELET - Abnormal; Notable for the following components:     WBC 34.8 (*)    RBC 2.05 (*)    Hemoglobin 4.8 (*)    HCT 16.2 (*)    MCV 79.2 (*)    MCH 23.5 (*)    MCHC 29.7 (*)    RDW 20.7 (*)    Neutro Abs 31.1 (*)    Monocytes Absolute 2.1 (*)    Basophils Absolute 0.3 (*)    All other components within normal limits  URINE CULTURE  CBC WITH DIFFERENTIAL/PLATELET  BASIC METABOLIC PANEL  TROPONIN I  TYPE AND SCREEN  PREPARE RBC (CROSSMATCH)   ____________________________________________  EKG  EKG read and interpreted by me shows A. fib at a rate of 107 incomplete left bundle branch block and left axis deviation no other changes ____________________________________________  RADIOLOGY  ED MD interpretation: chest x-ray read by radiology and reviewed by me shows no acute findings  Official radiology report(s): Dg Chest Portable 1 View  Result Date: 05/30/2017 CLINICAL DATA:  weakness.  Colon cancer. EXAM: PORTABLE CHEST 1 VIEW COMPARISON:  Acute abdomen series at 23:19 FINDINGS: Remote right rib fractures. The Chin overlies the apices minimally. Suggestion of hyperinflation. Midline trachea. Mild cardiomegaly. No pleural effusion or pneumothorax. No congestive failure. Mild left base scarring. IMPRESSION: Cardiomegaly and probable hyperinflation.  No acute findings. Electronically Signed   By: Abigail Miyamoto M.D.   On: 05/30/2017 12:27    ____________________________________________   PROCEDURES  Procedure(s) performed:   Procedures  Critical Care performed:  I did not see the patient long enough to charge critical care time on this patient ____________________________________________   INITIAL IMPRESSION / ASSESSMENT AND PLAN / ED COURSE   patient is very pale her hemoglobin and hematocrit are low white count is very high she has a bad UTI UTI. I will give her some Rocephin for UTI. Transfuse her. I discussed transfusion reaction with her and possibility of infection. Patient agreed to transfusion. She understands that her  bleeding is life-threatening at this point.  ____________________________________________   FINAL CLINICAL IMPRESSION(S) / ED DIAGNOSES  Final diagnoses:  Weakness  Dehydration  Urinary tract infection without hematuria, site unspecified  Anemia, unspecified type  Gastrointestinal hemorrhage, unspecified gastrointestinal hemorrhage type     ED Discharge Orders    None       Note:  This document was prepared using Dragon voice recognition software and may include unintentional dictation errors.    Nena Polio, MD 05/30/17 1354    Nena Polio, MD 06/12/17 [phone]

## 2017-05-31 ENCOUNTER — Encounter
Admission: RE | Admit: 2017-05-31 | Discharge: 2017-05-31 | Disposition: A | Discharge status: Home / Self Care | Payer: Medicare Other | Source: Ambulatory Visit | Attending: Internal Medicine | Admitting: Internal Medicine

## 2017-05-31 DIAGNOSIS — D500 Iron deficiency anemia secondary to blood loss (chronic): Secondary | ICD-10-CM

## 2017-05-31 LAB — TYPE AND SCREEN
ABO/RH(D): O POS
ANTIBODY SCREEN: NEGATIVE
Unit division: 0
Unit division: 0

## 2017-05-31 LAB — BPAM RBC
BLOOD PRODUCT EXPIRATION DATE: 201904232359
Blood Product Expiration Date: 201904232359
ISSUE DATE / TIME: 201903311811
ISSUE DATE / TIME: 201903311451
UNIT TYPE AND RH: 5100
Unit Type and Rh: 5100

## 2017-05-31 LAB — CBC
HCT: 21.4 % — ABNORMAL LOW (ref 35.0–47.0)
Hemoglobin: 7.3 g/dL — ABNORMAL LOW (ref 12.0–16.0)
MCH: 27.7 pg (ref 26.0–34.0)
MCHC: 34.2 g/dL (ref 32.0–36.0)
MCV: 81.1 fL (ref 80.0–100.0)
PLATELETS: 268 10*3/uL (ref 150–440)
RBC: 2.64 MIL/uL — AB (ref 3.80–5.20)
RDW: 20.3 % — ABNORMAL HIGH (ref 11.5–14.5)
WBC: 20.9 10*3/uL — AB (ref 3.6–11.0)

## 2017-05-31 LAB — HEMOGLOBIN
HEMOGLOBIN: 7.8 g/dL — AB (ref 12.0–16.0)
HEMOGLOBIN: 8.2 g/dL — AB (ref 12.0–16.0)

## 2017-05-31 LAB — BASIC METABOLIC PANEL
Anion gap: 8 (ref 5–15)
BUN: 37 mg/dL — ABNORMAL HIGH (ref 6–20)
CALCIUM: 7.6 mg/dL — AB (ref 8.9–10.3)
CHLORIDE: 103 mmol/L (ref 101–111)
CO2: 24 mmol/L (ref 22–32)
CREATININE: 0.85 mg/dL (ref 0.44–1.00)
GFR, EST NON AFRICAN AMERICAN: 57 mL/min — AB (ref >60)
Glucose, Bld: 106 mg/dL — ABNORMAL HIGH (ref 65–99)
Potassium: 4.3 mmol/L (ref 3.5–5.1)
SODIUM: 135 mmol/L (ref 135–145)

## 2017-05-31 LAB — MRSA PCR SCREENING: MRSA BY PCR: NEGATIVE

## 2017-05-31 LAB — LACTIC ACID, PLASMA: Lactic Acid, Venous: 1.5 mmol/L (ref 0.5–1.9)

## 2017-05-31 LAB — VITAMIN B12: VITAMIN B 12: 1543 pg/mL — AB (ref 180–914)

## 2017-05-31 MED ORDER — RISAQUAD PO CAPS
1.0000 | ORAL_CAPSULE | Freq: Two times a day (BID) | ORAL | Status: DC
  Administered 2017-05-31 – 2017-06-02 (×3): 1 via ORAL
  Filled 2017-05-31 (×2): qty 1

## 2017-05-31 MED ORDER — PEG 3350-KCL-NA BICARB-NACL 420 G PO SOLR
4000.0000 mL | Freq: Once | ORAL | Status: AC
  Administered 2017-05-31: 4000 mL via ORAL
  Filled 2017-05-31: qty 4000

## 2017-05-31 NOTE — Evaluation (Signed)
Physical Therapy Evaluation Patient Details Name: Beth Chen MRN: [id number] DOB: 1921-04-07 Today's Date: 05/31/2017   History of Present Illness  Pt admitted for anemia. Pt with complaints of fatigue and fall. HIstory includes colon cancer s/p R hemicolectomy, HTN and recent hospital stay for GI bleed. Pt currently pending GI consult, Hgb at 7.8 this morning after blood transfusion.  Clinical Impression  Pt is a pleasant 84 year old female who was admitted for anemia. Pt performs bed mobility, transfers, and ambulation with min assist and RW. Pt demonstrates deficits with endurance/mobility/strength. Pt generally fatigued this date. Not currently at baseline level. Would benefit from skilled PT to address above deficits and promote optimal return to PLOF; recommend transition to STR upon discharge from acute hospitalization.       Follow Up Recommendations SNF    Equipment Recommendations  None recommended by PT    Recommendations for Other Services       Precautions / Restrictions Precautions Precautions: Fall Restrictions Weight Bearing Restrictions: No      Mobility  Bed Mobility Overal bed mobility: Needs Assistance Bed Mobility: Supine to Sit     Supine to sit: Min assist     General bed mobility comments: needs assist to complete transfer to EOB. Also needs assist scooting out towards EOB.  Transfers Overall transfer level: Needs assistance Equipment used: Rolling walker (2 wheeled) Transfers: Sit to/from Stand Sit to Stand: Min assist         General transfer comment: needs slight assist to stand. Pt trying to pull up on RW, encouraged to push from bed. Once pushing from bed, better able to stand with min assist.  Ambulation/Gait Ambulation/Gait assistance: Min assist Ambulation Distance (Feet): 15 Feet Assistive device: Rolling walker (2 wheeled) Gait Pattern/deviations: Step-to pattern     General Gait Details: slow gait speed noted with RW. Needs  cues for obstacle avoidance. Decreased B step length. Limited endurance noted  Stairs            Wheelchair Mobility    Modified Rankin (Stroke Patients Only)       Balance Overall balance assessment: Needs assistance Sitting-balance support: Feet supported;Bilateral upper extremity supported Sitting balance-Leahy Scale: Good     Standing balance support: Bilateral upper extremity supported Standing balance-Leahy Scale: Good                               Pertinent Vitals/Pain Pain Assessment: No/denies pain    Home Living Family/patient expects to be discharged to:: Private residence Living Arrangements: Alone Available Help at Discharge: Family           Home Equipment: Gilford Rile - 4 wheels      Prior Function Level of Independence: Needs assistance         Comments: Pt from [facility] where she was receiving therapy. Plan is to transition to ALF once SNF no longer needed. Uses RW at [facility], however needs assist for all ADLs.     Hand Dominance        Extremity/Trunk Assessment   Upper Extremity Assessment Upper Extremity Assessment: Generalized weakness(B UE grossly 4+/5)    Lower Extremity Assessment Lower Extremity Assessment: Generalized weakness(B LE grossly 4-/5)       Communication   Communication: No difficulties  Cognition Arousal/Alertness: Awake/alert Behavior During Therapy: WFL for tasks assessed/performed Overall Cognitive Status: Within Functional Limits for tasks assessed  General Comments      Exercises Other Exercises Other Exercises: supine ther-ex performed on B LE including ankle pumps, quad sets, SLRs, hip abd/add, and heel slides. ALl ther-ex performed x 10 reps with cues for technique and cga.   Assessment/Plan    PT Assessment Patient needs continued PT services  PT Problem List Decreased strength;Decreased activity tolerance;Decreased  balance;Decreased mobility       PT Treatment Interventions Gait training;DME instruction;Therapeutic exercise;Balance training    PT Goals (Current goals can be found in the Care Plan section)  Acute Rehab PT Goals Patient Stated Goal: to get stronger PT Goal Formulation: With patient Time For Goal Achievement: 06/14/17 Potential to Achieve Goals: Good    Frequency Min 2X/week   Barriers to discharge        Co-evaluation               AM-PAC PT "6 Clicks" Daily Activity  Outcome Measure Difficulty turning over in bed (including adjusting bedclothes, sheets and blankets)?: Unable Difficulty moving from lying on back to sitting on the side of the bed? : Unable Difficulty sitting down on and standing up from a chair with arms (e.g., wheelchair, bedside commode, etc,.)?: Unable Help needed moving to and from a bed to chair (including a wheelchair)?: A Little Help needed walking in hospital room?: A Little Help needed climbing 3-5 steps with a railing? : A Little 6 Click Score: 12    End of Session Equipment Utilized During Treatment: Gait belt Activity Tolerance: Patient tolerated treatment well Patient left: in chair;with chair alarm set;with family/visitor present Nurse Communication: Mobility status PT Visit Diagnosis: Muscle weakness (generalized) (M62.81);Difficulty in walking, not elsewhere classified (R26.2)    Time: 7412-8786 PT Time Calculation (min) (ACUTE ONLY): 23 min   Charges:   PT Evaluation $PT Eval Low Complexity: 1 Low PT Treatments $Therapeutic Exercise: 8-22 mins   PT G Codes:        Beth Chen, PT, DPT [phone]   Beth Chen 05/31/2017, 2:07 PM

## 2017-05-31 NOTE — Consult Note (Signed)
Beth Lame, MD [facility]  [address] [place], [place] [address] Phone: [phone] Fax : [phone]  Consultation  Referring Provider:     Dr. Tressia Miners Primary Care Physician:  Kirk Ruths, MD Primary Gastroenterologist:  Dr. Vicente Males         Reason for Consultation:     Profound anemia  Date of Admission:  05/30/2017 Date of Consultation:  05/31/2017         HPI:   Beth Chen is a 84 y.o. female who was admitted with fatigue.  The patient has a history of colon cancer with colon resection back in 2006 in [place].  Patient also had an admission back in 2018 for rectal bleeding and was thought to have diverticular bleeding.  Patient now with a hemoglobin of 4.8 which increased to 7.8 after 2 units of blood.  Patient denies any black stools or bloody stools patient was found to have positive stools.  The patient was noted to be hypotensive at rehab had a systolic tachycardic.  Patient was admitted to the hospital for evaluation of her anemia.  The patient's baseline hemoglobin from a month ago was over 10.  Patient was also found to have elevated white count with her white cell count 119.3 which increased to 30 four-point this morning was 20.9.  Past Medical History:  Diagnosis Date  . Allergic rhinitis 11/18/2013  . Asthma with COPD (chronic obstructive pulmonary disease) ([place]) 11/18/2013   Last Assessment & Plan:  No flair ups noted recently and breathing is essentially at baseline.    . Asthma without status asthmaticus    unspecified  . Borderline hypothyroidism 11/18/2013   Last Assessment & Plan:  Energy and tsh are doing well  . Cancer [facility])    colon cancer- surgically removed in 2006  . Cholelithiases   . Chronic kidney disease   . Colon cancer ([place])   . Complete left bundle branch block (LBBB) 11/18/2013  . Constipation 11/18/2013  . Diverticulosis   . Hiatal hernia    right  . HLD (hyperlipidemia) 11/18/2013   Last Assessment & Plan:  Appropriate diet is  followed and no myalgia's are noted.    . Hyperlipidemia, unspecified   . Hypertension   . Hypertensive kidney disease with CKD stage III ([place]) 11/18/2013   Last Assessment & Plan:  Is compliant with hypertensive medications without clear side effects or lack of control.  Nausea and itching are not symptomatic and nsaids are being avoided.    . Melanoma ([place])   . Osteopenia     Past Surgical History:  Procedure Laterality Date  . ABDOMINAL HYSTERECTOMY    . Bladder Lift    . BREAST EXCISIONAL BIOPSY Left 15+ yrs ago   neg  . COLON RESECTION  2006   colon cancer  . COLON SURGERY    . COLON SURGERY Right   . SKIN CANCER EXCISION  2006   melanoma    Prior to Admission medications   Medication Sig Start Date End Date Taking? Authorizing Provider  acetaminophen (TYLENOL) 325 MG tablet Take 650 mg by mouth every 4 (four) hours as needed. for pain/ increased temp. May be administered orally, per G-tube if needed or rectally if unable to swallow (separate order). Maximum dose for 24 hours is 3,000 mg from all sources of Acetaminophen/ Tylenol   Yes [provider]  aspirin EC 81 MG tablet Take 81 mg by mouth at bedtime. Heart Health- per home regimen  Yes [provider]  bisacodyl (DULCOLAX) 10 MG suppository Place 10 mg rectally 2 (two) times daily as needed.   Yes [provider]  calcium carbonate (TUMS EX) 750 MG chewable tablet Chew 2 tablets by mouth daily.    Yes [provider]  Cholecalciferol (VITAMIN D-1000 MAX ST) 1000 units tablet Take 2,000 Units by mouth daily.  12/10/15  Yes [provider]  cyanocobalamin 1000 MCG tablet Take 2,000 mcg by mouth daily.    Yes [provider]  docusate sodium (COLACE) 100 MG capsule Take 1 capsule (100 mg total) by mouth daily as needed. 04/14/17 04/14/18 Yes McShane, Gerda Diss, MD  fluticasone (FLONASE) 50 MCG/ACT nasal spray USE 2 SPRAYS IN EACH       NOSTRIL DAILY 11/19/15  Yes [provider]  lactulose (CHRONULAC) 10 GM/15ML solution Take 30 g by mouth 2 (two) times daily as needed.   Yes [provider]  polyethylene glycol (MIRALAX / GLYCOLAX) packet Take 17 g by mouth daily.   Yes [provider]  Probiotic Product (RISA-BID PROBIOTIC) TABS Take 1 tablet by mouth 2 (two) times daily.   Yes [provider]    Family History  Problem Relation Age of Onset  . Breast cancer Daughter 41       passed at 72 from breast cancer  . Heart disease Mother   . Heart failure Mother   . Lung cancer Father      Social History   Tobacco Use  . Smoking status: Never Smoker  . Smokeless tobacco: Never Used  Substance Use Topics  . Alcohol use: No  . Drug use: Not on file    Allergies as of 05/30/2017  . (No Known Allergies)    Review of Systems:    All systems reviewed and negative except where noted in HPI.   Physical Exam:  Vital signs in last 24 hours: Temp:  [97.5 F (36.4 C)-98.7 F (37.1 C)] 97.7 F (36.5 C) (04/01 1212) Pulse Rate:  [74-107] 74 (04/01 1212) Resp:  [14-20] 18 (04/01 1212) BP: (107-145)/(61-100) 133/68 (04/01 1212) SpO2:  [97 %-100 %] 99 % (04/01 1212) Last BM Date: (pt unsure) General:   Pleasant, cooperative in NAD Head:  Normocephalic and atraumatic. Eyes:   No icterus.   Conjunctiva pink. PERRLA. Ears:  Normal auditory acuity. Neck:  Supple; no masses or thyroidomegaly Lungs: Respirations even and unlabored. Lungs clear to auscultation bilaterally.   No wheezes, crackles, or rhonchi.  Heart:  Regular rate and rhythm;  Without murmur, clicks, rubs or gallops Abdomen:  Soft, nondistended, nontender. Normal bowel sounds. No appreciable masses or hepatomegaly.  No rebound or guarding.  Rectal:  Not performed. Msk:  Symmetrical without gross deformities.   Extremities:  Without edema, cyanosis or clubbing. Neurologic:  Alert and oriented x3;  grossly normal neurologically. Skin:  Intact without  significant lesions or rashes. Cervical Nodes:  No significant cervical adenopathy. Psych:  Alert and cooperative. Normal affect.  LAB RESULTS: Recent Labs    05/30/17 1127 05/30/17 2213 05/31/17 0039 05/31/17 0627  WBC 34.8*  --  20.9*  --   HGB 4.8* 7.8* 7.3* 7.8*  HCT 16.2* 23.9* 21.4*  --   PLT 357  --  268  --    BMET Recent Labs    05/30/17 1127 05/31/17 0039  NA 133* 135  K 4.6 4.3  CL 100* 103  CO2 17* 24  GLUCOSE 119* 106*  BUN 43* 37*  CREATININE 1.08* 0.85  CALCIUM 8.5* 7.6*   LFT Recent Labs    05/30/17 1127  PROT 5.6*  ALBUMIN 2.8*  AST 45*  ALT 11*  ALKPHOS 54  BILITOT 0.7   PT/INR No results for input(s): LABPROT, INR in the last 72 hours.  STUDIES: Dg Chest Portable 1 View  Result Date: 05/30/2017 CLINICAL DATA:  weakness.  Colon cancer. EXAM: PORTABLE CHEST 1 VIEW COMPARISON:  Acute abdomen series at 23:19 FINDINGS: Remote right rib fractures. The Chin overlies the apices minimally. Suggestion of hyperinflation. Midline trachea. Mild cardiomegaly. No pleural effusion or pneumothorax. No congestive failure. Mild left base scarring. IMPRESSION: Cardiomegaly and probable hyperinflation.  No acute findings. Electronically Signed   By: Abigail Miyamoto M.D.   On: 05/30/2017 12:27      Impression / Plan:   Luzmaria Devaux is a 84 y.o. y/o female with profound anemia on admission.  The patient had no other the patient her anemia.  The patient had a iron saturation of 4 with a iron level of 13.  After discussing the risks and benefits of the procedure and the risks and benefits of not doing any procedures which can include continued bleeding, heart attack and stroke risk procedure the possible hypoxia cardiac event and death.  The patient and her daughter state they want to proceed with a colonoscopy and EGD.  These will be set up for tomorrow.  Thank you for involving me in the care of this patient.      LOS: 1 day   Beth Lame, MD  05/31/2017, 1:56  PM   Note: This dictation was prepared with Dragon dictation along with smaller phrase technology. Any transcriptional errors that result from this process are unintentional.

## 2017-05-31 NOTE — Plan of Care (Signed)
Patient given 2 units of blood and hemoglobin is now 7.3.  Beth Chen

## 2017-05-31 NOTE — Progress Notes (Signed)
[place] at [place] NAME: Beth Chen    MR#:  [id number]  DATE OF BIRTH:  Feb 13, 1922  SUBJECTIVE: Admitted yesterday for severe anemia, fatigue received 2 units of transfusion of packed RBC, hemoglobin improved from 4.8-7.8.  Patient family wants to do EGD, colonoscopy, scheduled for tomorrow as per GI note.  Patient denies any complaints.  Spoke with patient's daughter..  CHIEF COMPLAINT:   Chief Complaint  Patient presents with  . Fatigue    REVIEW OF SYSTEMS:    Review of Systems  Constitutional: Negative for chills and fever.  HENT: Negative for hearing loss.   Eyes: Negative for blurred vision, double vision and photophobia.  Respiratory: Negative for cough, hemoptysis and shortness of breath.   Cardiovascular: Negative for palpitations, orthopnea and leg swelling.  Gastrointestinal: Negative for abdominal pain, diarrhea and vomiting.  Genitourinary: Negative for dysuria and urgency.  Musculoskeletal: Negative for myalgias and neck pain.  Skin: Negative for rash.  Neurological: Negative for dizziness, focal weakness, seizures, weakness and headaches.  Psychiatric/Behavioral: Negative for memory loss. The patient does not have insomnia.     Nutrition: started on clear liquid diet.  tolerating Diet: Tolerating PT:      DRUG ALLERGIES:  No Known Allergies  VITALS:  Blood pressure 133/68, pulse 74, temperature 97.7 F (36.5 C), temperature source Oral, resp. rate 18, height 5\' 1"  (1.549 m), weight 64 kg (141 lb), SpO2 99 %.  PHYSICAL EXAMINATION:   Physical Exam  GENERAL:  84 y.o.-year-old patient lying in the bed with no acute distress.  EYES: Pupils equal, round, reactive to light and accommodation. No scleral icterus. Extraocular muscles intact.  HEENT: Head atraumatic, normocephalic. Oropharynx and nasopharynx clear.  NECK:  Supple, no jugular venous distention. No thyroid enlargement, no tenderness.  LUNGS:  Normal breath sounds bilaterally, no wheezing, rales,rhonchi or crepitation. No use of accessory muscles of respiration.  CARDIOVASCULAR: S1, S2 normal. No murmurs, rubs, or gallops.  ABDOMEN: Soft, nontender, nondistended. Bowel sounds present. No organomegaly or mass.  EXTREMITIES: No pedal edema, cyanosis, or clubbing.  NEUROLOGIC: Cranial nerves II through XII are intact. Muscle strength 5/5 in all extremities. Sensation intact. Gait not checked.  PSYCHIATRIC: The patient is alert and oriented x 3.  SKIN: No obvious rash, lesion, or ulcer.    LABORATORY PANEL:   CBC Recent Labs  Lab 05/31/17 0039 05/31/17 0627  WBC 20.9*  --   HGB 7.3* 7.8*  HCT 21.4*  --   PLT 268  --    ------------------------------------------------------------------------------------------------------------------  Chemistries  Recent Labs  Lab 05/30/17 1127 05/31/17 0039  NA 133* 135  K 4.6 4.3  CL 100* 103  CO2 17* 24  GLUCOSE 119* 106*  BUN 43* 37*  CREATININE 1.08* 0.85  CALCIUM 8.5* 7.6*  AST 45*  --   ALT 11*  --   ALKPHOS 54  --   BILITOT 0.7  --    ------------------------------------------------------------------------------------------------------------------  Cardiac Enzymes Recent Labs  Lab 05/30/17 2213  TROPONINI 0.04*   ------------------------------------------------------------------------------------------------------------------  RADIOLOGY:  Dg Chest Portable 1 View  Result Date: 05/30/2017 CLINICAL DATA:  weakness.  Colon cancer. EXAM: PORTABLE CHEST 1 VIEW COMPARISON:  Acute abdomen series at 23:19 FINDINGS: Remote right rib fractures. The Chin overlies the apices minimally. Suggestion of hyperinflation. Midline trachea. Mild cardiomegaly. No pleural effusion or pneumothorax. No congestive failure. Mild left base scarring. IMPRESSION: Cardiomegaly and probable hyperinflation.  No acute findings. Electronically Signed   By: Marylyn Ishihara  Jobe Igo M.D.   On: 05/30/2017 12:27      ASSESSMENT AND PLAN:   Active Problems:   Anemia #1 profound symptomatic anemia, patient hemoglobin was 0.8 on admission improved to 7.8 after 2 units of packed RBC, patient feels better, found to have severe iron deficiency anemia with iron level of 13.  Patient scheduled to have EGD, colonoscopy tomorrow.  Start iron replacement, discontinued aspirin.  Had history of colon cancer    Discussed with daughter. CODE STATUS DNR    All the records are reviewed and case discussed with Care Management/Social Workerr. Management plans discussed with the patient, family and they are in agreement.  CODE STATUS: DNR  TOTAL TIME TAKING CARE OF THIS PATIENT: 35 minutes.   POSSIBLE D/C IN 1-2DAYS, DEPENDING ON CLINICAL CONDITION.   Epifanio Lesches M.D on 05/31/2017 at 2:12 PM  Between 7am to 6pm - Pager - [phone]  After 6pm go to [email or web address] - password [email or web address] [facility]  [place] [facility]  Office  [phone]  CC: Primary care physician; Kirk Ruths, MD

## 2017-06-01 ENCOUNTER — Encounter: Payer: Self-pay | Admitting: Anesthesiology

## 2017-06-01 ENCOUNTER — Encounter
Admission: EM | Disposition: A | Discharge status: Skilled Nursing Facility | Payer: Self-pay | Source: Home / Self Care | Attending: Internal Medicine

## 2017-06-01 ENCOUNTER — Inpatient Hospital Stay: Payer: Medicare Other | Admitting: Anesthesiology

## 2017-06-01 DIAGNOSIS — K922 Gastrointestinal hemorrhage, unspecified: Secondary | ICD-10-CM

## 2017-06-01 DIAGNOSIS — K2100 Gastro-esophageal reflux disease with esophagitis, without bleeding: Secondary | ICD-10-CM

## 2017-06-01 HISTORY — PX: COLONOSCOPY WITH PROPOFOL: SHX5780

## 2017-06-01 HISTORY — PX: ESOPHAGOGASTRODUODENOSCOPY (EGD) WITH PROPOFOL: SHX5813

## 2017-06-01 LAB — URINE CULTURE: Culture: >=100000 — AB

## 2017-06-01 SURGERY — ESOPHAGOGASTRODUODENOSCOPY (EGD) WITH PROPOFOL
Anesthesia: General

## 2017-06-01 MED ORDER — EPHEDRINE SULFATE 50 MG/ML IJ SOLN
INTRAMUSCULAR | Status: DC | PRN
  Administered 2017-06-01: 5 mg via INTRAVENOUS

## 2017-06-01 MED ORDER — LIDOCAINE HCL (CARDIAC) 20 MG/ML IV SOLN
INTRAVENOUS | Status: DC | PRN
  Administered 2017-06-01 (×2): 40 mg via INTRAVENOUS

## 2017-06-01 MED ORDER — PHENYLEPHRINE HCL 10 MG/ML IJ SOLN
INTRAMUSCULAR | Status: DC | PRN
  Administered 2017-06-01: 100 ug via INTRAVENOUS

## 2017-06-01 MED ORDER — PROPOFOL 10 MG/ML IV BOLUS
INTRAVENOUS | Status: DC | PRN
  Administered 2017-06-01: 15 mg via INTRAVENOUS
  Administered 2017-06-01: 10 mg via INTRAVENOUS
  Administered 2017-06-01: 15 mg via INTRAVENOUS

## 2017-06-01 MED ORDER — LACTATED RINGERS IV SOLN
INTRAVENOUS | Status: DC | PRN
  Administered 2017-06-01: 15:00:00 via INTRAVENOUS

## 2017-06-01 MED ORDER — PROPOFOL 500 MG/50ML IV EMUL
INTRAVENOUS | Status: DC | PRN
  Administered 2017-06-01: 75 ug/kg/min via INTRAVENOUS

## 2017-06-01 MED ORDER — PROPOFOL 500 MG/50ML IV EMUL
INTRAVENOUS | Status: AC
  Filled 2017-06-01: qty 50

## 2017-06-01 MED ORDER — PANTOPRAZOLE SODIUM 40 MG PO TBEC
40.0000 mg | DELAYED_RELEASE_TABLET | Freq: Two times a day (BID) | ORAL | Status: DC
  Administered 2017-06-01 – 2017-06-02 (×2): 40 mg via ORAL
  Filled 2017-06-01 (×2): qty 1

## 2017-06-01 NOTE — Progress Notes (Signed)
[place] at [place] NAME: Beth Chen    MR#:  [id number]  DATE OF BIRTH:  08-09-1921  SUBJECTIVE: Scheduled for EGD, colonoscopy today afternoon.  Patient denies any complaints.  Does have E. coli UTI...  CHIEF COMPLAINT:   Chief Complaint  Patient presents with  . Fatigue    REVIEW OF SYSTEMS:    Review of Systems  Constitutional: Negative for chills and fever.  HENT: Negative for hearing loss.   Eyes: Negative for blurred vision, double vision and photophobia.  Respiratory: Negative for cough, hemoptysis and shortness of breath.   Cardiovascular: Negative for palpitations, orthopnea and leg swelling.  Gastrointestinal: Negative for abdominal pain, diarrhea and vomiting.  Genitourinary: Negative for dysuria and urgency.  Musculoskeletal: Negative for myalgias and neck pain.  Skin: Negative for rash.  Neurological: Negative for dizziness, focal weakness, seizures, weakness and headaches.  Psychiatric/Behavioral: Negative for memory loss. The patient does not have insomnia.     Nutrition: started on clear liquid diet.  tolerating Diet: Tolerating PT:      DRUG ALLERGIES:  No Known Allergies  VITALS:  Blood pressure (!) 116/54, pulse 68, temperature 97.7 F (36.5 C), temperature source Oral, resp. rate 18, height 5\' 1"  (1.549 m), weight 64 kg (141 lb), SpO2 98 %.  PHYSICAL EXAMINATION:   Physical Exam  GENERAL:  84 y.o.-year-old patient lying in the bed with no acute distress.  EYES: Pupils equal, round, reactive to light and accommodation. No scleral icterus. Extraocular muscles intact.  HEENT: Head atraumatic, normocephalic. Oropharynx and nasopharynx clear.  NECK:  Supple, no jugular venous distention. No thyroid enlargement, no tenderness.  LUNGS: Normal breath sounds bilaterally, no wheezing, rales,rhonchi or crepitation. No use of accessory muscles of respiration.  CARDIOVASCULAR: S1, S2 normal. No murmurs,  rubs, or gallops.  ABDOMEN: Soft, nontender, nondistended. Bowel sounds present. No organomegaly or mass.  EXTREMITIES: No pedal edema, cyanosis, or clubbing.  NEUROLOGIC: Cranial nerves II through XII are intact. Muscle strength 5/5 in all extremities. Sensation intact. Gait not checked.  PSYCHIATRIC: The patient is alert and oriented x 3.  SKIN: No obvious rash, lesion, or ulcer.    LABORATORY PANEL:   CBC Recent Labs  Lab 05/31/17 0039  05/31/17 1514  WBC 20.9*  --   --   HGB 7.3*   < > 8.2*  HCT 21.4*  --   --   PLT 268  --   --    < > = values in this interval not displayed.   ------------------------------------------------------------------------------------------------------------------  Chemistries  Recent Labs  Lab 05/30/17 1127 05/31/17 0039  NA 133* 135  K 4.6 4.3  CL 100* 103  CO2 17* 24  GLUCOSE 119* 106*  BUN 43* 37*  CREATININE 1.08* 0.85  CALCIUM 8.5* 7.6*  AST 45*  --   ALT 11*  --   ALKPHOS 54  --   BILITOT 0.7  --    ------------------------------------------------------------------------------------------------------------------  Cardiac Enzymes Recent Labs  Lab 05/30/17 2213  TROPONINI 0.04*   ------------------------------------------------------------------------------------------------------------------  RADIOLOGY:  No results found.   ASSESSMENT AND PLAN:   Active Problems:   Anemia #1 .profound symptomatic anemia, patient hemoglobin was 4.8 on admission improved to 7.8 after 2 units of packed RBC, today patient hemoglobin 8.2 and it isstable after the transfusion without evidence of further drop.  Patient feels better, found to have severe iron deficiency anemia with iron level of 13.  Patient scheduled to have EGD, colonoscopy tomorrow.  Started iron replacement, discontinued aspirin.  Had history of colon cancer  Appreciate gastroenterology following the patient. 2.  Sepsis present on admission due to E. coli UTI: Leukocytosis,  patient white count up to 34 when she came with elevated lactic acid, WBC now 20.9, urine cultures are showing E. coli sensitive to cefazolin, Rocephin, nitrofurantoin, Zosyn.  Likely discharge home tomorrow.  She needs IV antibiotics with meropenem for 10 days so we will get a PICC line. Discussed with daughter. CODE STATUS DNR    All the records are reviewed and case discussed with Care Management/Social Workerr. Management plans discussed with the patient, family and they are in agreement.  CODE STATUS: DNR  TOTAL TIME TAKING CARE OF THIS PATIENT: 35 minutes.   POSSIBLE D/C IN 1-2DAYS, DEPENDING ON CLINICAL CONDITION.   Epifanio Lesches M.D on 06/01/2017 at 12:22 PM  Between 7am to 6pm - Pager - [phone]  After 6pm go to [email or web address] - password [email or web address] [facility]  [place] [facility]  Office  [phone]  CC: Primary care physician; Kirk Ruths, MD

## 2017-06-01 NOTE — Anesthesia Postprocedure Evaluation (Signed)
Anesthesia Post Note  Patient: Beth Chen  Procedure(s) Performed: ESOPHAGOGASTRODUODENOSCOPY (EGD) WITH PROPOFOL (N/A ) COLONOSCOPY WITH PROPOFOL (N/A )  Patient location during evaluation: Endoscopy Anesthesia Type: General Level of consciousness: awake and alert Pain management: pain level controlled Vital Signs Assessment: post-procedure vital signs reviewed and stable Respiratory status: spontaneous breathing, nonlabored ventilation, respiratory function stable and patient connected to nasal cannula oxygen Cardiovascular status: blood pressure returned to baseline and stable Postop Assessment: no apparent nausea or vomiting Anesthetic complications: no     Last Vitals:  Vitals:   06/01/17 1552 06/01/17 1554  BP:  (!) 164/66  Pulse:    Resp: 19   Temp:    SpO2:      Last Pain:  Vitals:   06/01/17 1604  TempSrc:   PainSc: 0-No pain                 Mansa Willers S

## 2017-06-01 NOTE — Anesthesia Preprocedure Evaluation (Addendum)
Anesthesia Evaluation  Patient identified by MRN, date of birth, ID band Patient awake    Reviewed: Allergy & Precautions, NPO status , Patient's Chart, lab work & pertinent test results, reviewed documented beta blocker date and time   Airway Mallampati: II  TM Distance: >3 FB     Dental  (+) Chipped, Missing, Poor Dentition   Pulmonary asthma , COPD,           Cardiovascular hypertension, Pt. on medications + dysrhythmias      Neuro/Psych  Neuromuscular disease    GI/Hepatic hiatal hernia,   Endo/Other  Hypothyroidism   Renal/GU Renal disease     Musculoskeletal   Abdominal   Peds  Hematology  (+) anemia ,   Anesthesia Other Findings DNR. Hb 4.8 on arrival in the hospital. Now 8.2. Wheel chair bound.  Reproductive/Obstetrics                             Anesthesia Physical Anesthesia Plan  ASA: III  Anesthesia Plan: General   Post-op Pain Management:    Induction: Intravenous  PONV Risk Score and Plan:   Airway Management Planned:   Additional Equipment:   Intra-op Plan:   Post-operative Plan:   Informed Consent: I have reviewed the patients History and Physical, chart, labs and discussed the procedure including the risks, benefits and alternatives for the proposed anesthesia with the patient or authorized representative who has indicated his/her understanding and acceptance.     Plan Discussed with: CRNA  Anesthesia Plan Comments:         Anesthesia Quick Evaluation

## 2017-06-01 NOTE — Transfer of Care (Signed)
Immediate Anesthesia Transfer of Care Note  Patient: Beth Chen  Procedure(s) Performed: ESOPHAGOGASTRODUODENOSCOPY (EGD) WITH PROPOFOL (N/A ) COLONOSCOPY WITH PROPOFOL (N/A )  Patient Location: PACU  Anesthesia Type:General  Level of Consciousness: awake, alert  and oriented  Airway & Oxygen Therapy: Patient Spontanous Breathing and Patient connected to nasal cannula oxygen  Post-op Assessment: Report given to RN and Post -op Vital signs reviewed and stable  Post vital signs: Reviewed and stable  Last Vitals:  Vitals Value Taken Time  BP 144/60 06/01/2017  3:46 PM  Temp 36.1 C 06/01/2017  3:44 PM  Pulse    Resp 22 06/01/2017  3:52 PM  SpO2 96 % 06/01/2017  3:44 PM  Vitals shown include unvalidated device data.  Last Pain:  Vitals:   06/01/17 1544  TempSrc: Tympanic  PainSc: 0-No pain         Complications: No apparent anesthesia complications

## 2017-06-01 NOTE — Anesthesia Post-op Follow-up Note (Signed)
Anesthesia QCDR form completed.        

## 2017-06-01 NOTE — Anesthesia Procedure Notes (Signed)
Performed by: Matisha Termine, CRNA Pre-anesthesia Checklist: Patient identified, Emergency Drugs available, Suction available and Patient being monitored Patient Re-evaluated:Patient Re-evaluated prior to induction Oxygen Delivery Method: Nasal cannula Induction Type: IV induction Dental Injury: Teeth and Oropharynx as per pre-operative assessment  Comments: Nasal cannula with etCO2 monitoring       

## 2017-06-01 NOTE — Op Note (Addendum)
[facility] Gastroenterology Patient Name: Beth Chen Procedure Date: 06/01/2017 3:04 PM MRN: [id number] Account #: [id number] Date of Birth: 10-16-1921 Admit Type: Inpatient Age: 84 Room: [facility] ENDO ROOM 4 Gender: Female Note Status: Finalized Procedure:            Colonoscopy Indications:          Acute post hemorrhagic anemia Providers:            [name] MD, MD Referring MD:         Ocie Cornfield. Ouida Sills MD, MD (Referring MD) Medicines:            Propofol per Anesthesia Complications:        No immediate complications. Procedure:            Pre-Anesthesia Assessment:                       - Prior to the procedure, a History and Physical was                        performed, and patient medications and allergies were                        reviewed. The patient's tolerance of previous                        anesthesia was also reviewed. The risks and benefits of                        the procedure and the sedation options and risks were                        discussed with the patient. All questions were                        answered, and informed consent was obtained. Prior                        Anticoagulants: The patient has taken no previous                        anticoagulant or antiplatelet agents. ASA Grade                        Assessment: II - A patient with mild systemic disease.                        After reviewing the risks and benefits, the patient was                        deemed in satisfactory condition to undergo the                        procedure.                       After obtaining informed consent, the colonoscope was                        passed under direct vision. Throughout the procedure,  the patient's blood pressure, pulse, and oxygen                        saturations were monitored continuously. The                        Colonoscope was introduced through the anus and   advanced to the the ileocolonic anastomosis. The                        colonoscopy was performed without difficulty. The                        patient tolerated the procedure well. The quality of                        the bowel preparation was poor. Findings:      The perianal and digital rectal examinations were normal.      There was evidence of a prior side-to-side ileo-colonic anastomosis in       the ascending colon. This was patent.      Multiple small-mouthed diverticula were found in the entire colon. Impression:           - Preparation of the colon was poor.                       - Patent side-to-side ileo-colonic anastomosis.                       - Diverticulosis in the entire examined colon.                       - No specimens collected. Recommendation:       - Return patient to hospital ward for ongoing care. Procedure Code(s):    --- Professional ---                       [phone], Colonoscopy, flexible; diagnostic, including                        collection of specimen(s) by brushing or washing, when                        performed (separate procedure) Diagnosis Code(s):    --- Professional ---                       D62, Acute posthemorrhagic anemia CPT copyright 2017 American Medical Association. All rights reserved. The codes documented in this report are preliminary and upon coder review may  be revised to meet current compliance requirements. [name] MD, MD 06/01/2017 3:34:12 PM This report has been signed electronically. Number of Addenda: 0 Note Initiated On: 06/01/2017 3:04 PM Scope Withdrawal Time: 0 hours 3 minutes 31 seconds  Total Procedure Duration: 0 hours 10 minutes 37 seconds       [facility]

## 2017-06-01 NOTE — Op Note (Signed)
[facility] Gastroenterology Patient Name: Beth Chen Procedure Date: 06/01/2017 3:05 PM MRN: [id number] Account #: [id number] Date of Birth: 26-Jan-1922 Admit Type: Inpatient Age: 84 Room: [facility] ENDO ROOM 4 Gender: Female Note Status: Finalized Procedure:            Upper GI endoscopy Indications:          Acute post hemorrhagic anemia Providers:            [name] MD, MD Referring MD:         Ocie Cornfield. Ouida Sills MD, MD (Referring MD) Medicines:            Propofol per Anesthesia Complications:        No immediate complications. Procedure:            Pre-Anesthesia Assessment:                       - Prior to the procedure, a History and Physical was                        performed, and patient medications and allergies were                        reviewed. The patient's tolerance of previous                        anesthesia was also reviewed. The risks and benefits of                        the procedure and the sedation options and risks were                        discussed with the patient. All questions were                        answered, and informed consent was obtained. Prior                        Anticoagulants: The patient has taken no previous                        anticoagulant or antiplatelet agents. ASA Grade                        Assessment: II - A patient with mild systemic disease.                        After reviewing the risks and benefits, the patient was                        deemed in satisfactory condition to undergo the                        procedure.                       After obtaining informed consent, the endoscope was                        passed under direct vision. Throughout the procedure,  the patient's blood pressure, pulse, and oxygen                        saturations were monitored continuously. The Endoscope                        was introduced through the mouth, and advanced to the                     second part of duodenum. The upper GI endoscopy was                        accomplished without difficulty. The patient tolerated                        the procedure well. Findings:      A large hiatal hernia was present.      LA Grade D (one or more mucosal breaks involving at least 75% of       esophageal circumference) esophagitis with bleeding was found in the       lower third of the esophagus.      The stomach was normal.      The examined duodenum was normal. Impression:           - Large hiatal hernia.                       - LA Grade D reflux esophagitis.                       - Normal stomach.                       - Normal examined duodenum.                       - No specimens collected. Recommendation:       - Return patient to hospital ward for ongoing care.                       - Perform a colonoscopy today. Procedure Code(s):    --- Professional ---                       [id number], Esophagogastroduodenoscopy, flexible, transoral;                        diagnostic, including collection of specimen(s) by                        brushing or washing, when performed (separate procedure) Diagnosis Code(s):    --- Professional ---                       D62, Acute posthemorrhagic anemia                       K21.0, Gastro-esophageal reflux disease with esophagitis CPT copyright 2017 American Medical Association. All rights reserved. The codes documented in this report are preliminary and upon coder review may  be revised to meet current compliance requirements. [name] MD, MD 06/01/2017 3:18:58 PM This report has been signed electronically. Number of Addenda: 0 Note Initiated On: 06/01/2017 3:05 PM  [facility]

## 2017-06-02 ENCOUNTER — Inpatient Hospital Stay: Payer: Medicare Other

## 2017-06-02 MED ORDER — CEPHALEXIN 500 MG PO CAPS: 500.0000 mg | ORAL_CAPSULE | Freq: Two times a day (BID) | ORAL | 0 refills | Status: AC

## 2017-06-02 MED ORDER — FERROUS SULFATE 325 (65 FE) MG PO TABS: 325.0000 mg | ORAL_TABLET | Freq: Every day | ORAL | 3 refills | Status: DC

## 2017-06-02 MED ORDER — PANTOPRAZOLE SODIUM 40 MG PO TBEC: 40.0000 mg | DELAYED_RELEASE_TABLET | Freq: Two times a day (BID) | ORAL | 0 refills | Status: DC

## 2017-06-02 MED ORDER — LOSARTAN POTASSIUM 25 MG PO TABS: 25.0000 mg | ORAL_TABLET | ORAL | 0 refills | Status: DC | PRN

## 2017-06-02 NOTE — Clinical Social Work Note (Signed)
Patient discharging back to [facility] today. Sharyn Lull at [facility] is aware and discharge information sent. Nurse to call report. Patient's daughter aware and to transport patient. Shela Leff MSW,LCSW [phone]

## 2017-06-02 NOTE — Progress Notes (Signed)
Beth Chen  A and O x 4. VSS. Pt tolerating diet well. No complaints of pain or nausea. IV removed intact, prescriptions given. Pt daughter voiced understanding of discharge instructions with no further questions. Pt discharged via wheelchair with nurse to [facility]. Family will be transporting pt.    Allergies as of 06/02/2017   No Known Allergies     Medication List    STOP taking these medications   acetaminophen 325 MG tablet Commonly known as:  TYLENOL   aspirin EC 81 MG tablet     TAKE these medications   bisacodyl 10 MG suppository Commonly known as:  DULCOLAX Place 10 mg rectally 2 (two) times daily as needed.   calcium carbonate 750 MG chewable tablet Commonly known as:  TUMS EX Chew 2 tablets by mouth daily.   cephALEXin 500 MG capsule Commonly known as:  KEFLEX Take 1 capsule (500 mg total) by mouth 2 (two) times daily for 10 days. For 10 days   cyanocobalamin 1000 MCG tablet Take 2,000 mcg by mouth daily.   docusate sodium 100 MG capsule Commonly known as:  COLACE Take 1 capsule (100 mg total) by mouth daily as needed.   ferrous sulfate 325 (65 FE) MG tablet Take 1 tablet (325 mg total) by mouth daily.   fluticasone 50 MCG/ACT nasal spray Commonly known as:  FLONASE USE 2 SPRAYS IN EACH       NOSTRIL DAILY   lactulose 10 GM/15ML solution Commonly known as:  CHRONULAC Take 30 g by mouth 2 (two) times daily as needed.   losartan 25 MG tablet Commonly known as:  COZAAR Take 1 tablet (25 mg total) by mouth as needed.   pantoprazole 40 MG tablet Commonly known as:  PROTONIX Take 1 tablet (40 mg total) by mouth 2 (two) times daily before a meal.   polyethylene glycol packet Commonly known as:  MIRALAX / GLYCOLAX Take 17 g by mouth daily.   RISA-BID PROBIOTIC Tabs Take 1 tablet by mouth 2 (two) times daily.   VITAMIN D-1000 MAX ST 1000 units tablet Generic drug:  Cholecalciferol Take 2,000 Units by mouth daily.       Vitals:   06/02/17 0453  06/02/17 1313  BP: (!) 119/56 135/79  Pulse: 72 73  Resp: 20 18  Temp: 97.7 F (36.5 C) (!) 97.5 F (36.4 C)  SpO2: 99% 100%    Francesco Sor

## 2017-06-02 NOTE — Progress Notes (Signed)
The patient is down for an ultrasound but her hemoglobin has gone up overnight.  The patient was found to have severe esophagitis that was bleeding.  She underwent an EGD and colonoscopy yesterday.  The patient's hemoglobin is stable and no further GI workup is needed.  I discussed this with the patient's daughter who agrees.  I will sign off.  Please call if any further GI concerns or questions.  We would like to thank you for the opportunity to participate in the care of Beth Chen.

## 2017-06-02 NOTE — Progress Notes (Signed)
PT Cancellation Note  Patient Details Name: Beth Chen MRN: [id number] DOB: 06/12/1921   Cancelled Treatment:    Reason Eval/Treat Not Completed: Patient at procedure or test/unavailable. Treatment attempted, pt has been out of room this morning for an [place]. Daughter in room and states pt wishes to bed seen; also notes pt may discharge to skilled nursing facility later today. Daughter would like PT to attempt this afternoon if able.    Larae Grooms, PTA 06/02/2017, 12:37 PM

## 2017-06-02 NOTE — Discharge Summary (Signed)
Beth Chen, is a 84 y.o. female  DOB September 13, 1921  MRN [id number].  Admission date:  05/30/2017  Admitting Physician  Gladstone Lighter, MD  Discharge Date:  06/02/2017   Primary MD  Kirk Ruths, MD  Recommendations for primary care physician for things to follow:  Follow-up with PCP in 1 week Follow-up with Dr. [name] in 3-4 weeks   Admission Diagnosis  Dehydration [E86.0] Weakness [R53.1] Urinary tract infection without hematuria, site unspecified [N39.0] Gastrointestinal hemorrhage, unspecified gastrointestinal hemorrhage type [K92.2] Anemia, unspecified type [D64.9]   Discharge Diagnosis  Dehydration [E86.0] Weakness [R53.1] Urinary tract infection without hematuria, site unspecified [N39.0] Gastrointestinal hemorrhage, unspecified gastrointestinal hemorrhage type [K92.2] Anemia, unspecified type [D64.9]   Active Problems:   Anemia   Reflux esophagitis      Past Medical History:  Diagnosis Date  . Allergic rhinitis 11/18/2013  . Asthma with COPD (chronic obstructive pulmonary disease) ([place]) 11/18/2013   Last Assessment & Plan:  No flair ups noted recently and breathing is essentially at baseline.    . Asthma without status asthmaticus    unspecified  . Borderline hypothyroidism 11/18/2013   Last Assessment & Plan:  Energy and tsh are doing well  . Cancer [facility])    colon cancer- surgically removed in 2006  . Cholelithiases   . Chronic kidney disease   . Colon cancer ([place])   . Complete left bundle branch block (LBBB) 11/18/2013  . Constipation 11/18/2013  . Diverticulosis   . Hiatal hernia    right  . HLD (hyperlipidemia) 11/18/2013   Last Assessment & Plan:  Appropriate diet is followed and no myalgia's are noted.    . Hyperlipidemia, unspecified   . Hypertension   . Hypertensive kidney disease  with CKD stage III ([place]) 11/18/2013   Last Assessment & Plan:  Is compliant with hypertensive medications without clear side effects or lack of control.  Nausea and itching are not symptomatic and nsaids are being avoided.    . Melanoma ([place])   . Osteopenia     Past Surgical History:  Procedure Laterality Date  . ABDOMINAL HYSTERECTOMY    . Bladder Lift    . BREAST EXCISIONAL BIOPSY Left 15+ yrs ago   neg  . COLON RESECTION  2006   colon cancer  . COLON SURGERY    . COLON SURGERY Right   . SKIN CANCER EXCISION  2006   melanoma       History of present illness and  Hospital Course:     Kindly see H&P for history of present illness and admission details, please review complete Labs, Consult reports and Test reports for all details in brief  HPI  from the history and physical done on the day of admission 84 year old female patient with history of constipation came in because of fatigue, patient found to have severe anemia with hemoglobin 4.8.  Patient also had guaic positive stool in the emergency room.   Hospital Course  Severe symptomatic anemia with hemoglobin 4.8 on admission with guaiac positive stool.  Patient received 2 units of packed RBC, hemoglobin improved to 7.8.  Patient aspirin was stopped on admission. 2.  Because of severe symptomatic anemia requested GI consult, seen by Dr. Aundria Rud, patient had EGD, colonoscopy, EGD showed severe erosive esophagitis, colonoscopy showed diverticulosis without diverticulitis.  And had multiple diverticula throughout the colon.  Patient will receive Protonix 40 mg p.o. twice daily for 1 month followed by 40 mg p.o. daily upon discharge.  This  is in the instructions.  Discontinue aspirin. 3.  E. coli UTI sensitive to Keflex, patient will get Keflex for 10 days and patient needs repeat urine cultures on April 15 to follow-up on resolution.  Patient daughter is very concerned about her recurrent UTIs.  Patient had a UTI in February with  Pseudomonas.  Patient received 4 days of meropenem IV in the hospital while she is here. 4.  Deconditioning, generalized weakness: Physical therapy consult requested at [facility] 5.  Episodic hypetension, patient BP high at times and lower times so patient daughter requested that losartan be prescribed for her.  Patient was on losartan before 6 /severe iron deficiency anemia with iron saturation of 4, iron level of 13, patient will get ferrous sulfate prescription 325 mg twice daily. #7 right arm swelling, ; ultrasound of the right arm showed no DVT recommend cool compressions, elevation of the right hand for a couple of days at [facility].  #8 constipation: Patient to continue MiraLAX every day.  Discharge Condition:full   Follow UP   Contact information for follow-up providers    Kirk Ruths, MD. Schedule an appointment as soon as possible for a visit in 1 week(s).   Specialty:  Internal Medicine Contact information: [place] [address] [phone]        [name], MD. Schedule an appointment as soon as possible for a visit in 3 week(s).   Specialty:  Gastroenterology Contact information: [place] [address] [phone]            Contact information for after-discharge care    Destination    [facility] .   Service:  Skilled Nursing Contact information: [address] [place] [phone]                    Discharge Instructions  and  Discharge Medications   Discharge Instructions    Discharge instructions   Complete by:  As directed    She needs repeat urine cultures after finishing the treatment with antibiotic just to make sure it is resolved.  Please send urine cultures on April 15   Discharge instructions   Complete by:  As directed    Protonix 40 mg p.o. twice daily for 1 month followed by 40 mg daily   Face-to-face encounter  (required for Medicare/Medicaid patients)   Complete by:  As directed    I Epifanio Lesches certify that this patient is under my care and that I, or a nurse practitioner or physician's assistant working with me, had a face-to-face encounter that meets the physician face-to-face encounter requirements with this patient on 06/02/2017. The encounter with the patient was in whole, or in part for the following medical condition(s) which is the primary reason for home health care Condition UTI Erosive esophagitis with symptomatic anemia required transfusion Iron deficiency anemia   The encounter with the patient was in whole, or in part, for the following medical condition, which is the primary reason for home health care:  whole   I certify that, based on my findings, the following services are medically necessary home health services:   Nursing Physical therapy     Reason for Medically Necessary Home Health Services:  Therapy- Instruction on use of Assistive Device for Ambulation on all Surfaces   My clinical findings support the need for the above services:  Unsafe ambulation due to balance issues   Further, I  certify that my clinical findings support that this patient is homebound due to:  Unsafe ambulation due to balance issues   Home Health   Complete by:  As directed    To provide the following care/treatments:   PT Home Health Aide       Allergies as of 06/02/2017   No Known Allergies     Medication List    STOP taking these medications   acetaminophen 325 MG tablet Commonly known as:  TYLENOL   aspirin EC 81 MG tablet     TAKE these medications   bisacodyl 10 MG suppository Commonly known as:  DULCOLAX Place 10 mg rectally 2 (two) times daily as needed.   calcium carbonate 750 MG chewable tablet Commonly known as:  TUMS EX Chew 2 tablets by mouth daily.   cephALEXin 500 MG capsule Commonly known as:  KEFLEX Take 1 capsule (500 mg total) by mouth 2 (two) times daily for  10 days. For 10 days   cyanocobalamin 1000 MCG tablet Take 2,000 mcg by mouth daily.   docusate sodium 100 MG capsule Commonly known as:  COLACE Take 1 capsule (100 mg total) by mouth daily as needed.   ferrous sulfate 325 (65 FE) MG tablet Take 1 tablet (325 mg total) by mouth daily.   fluticasone 50 MCG/ACT nasal spray Commonly known as:  FLONASE USE 2 SPRAYS IN EACH       NOSTRIL DAILY   lactulose 10 GM/15ML solution Commonly known as:  CHRONULAC Take 30 g by mouth 2 (two) times daily as needed.   losartan 25 MG tablet Commonly known as:  COZAAR Take 1 tablet (25 mg total) by mouth as needed.   pantoprazole 40 MG tablet Commonly known as:  PROTONIX Take 1 tablet (40 mg total) by mouth 2 (two) times daily before a meal.   polyethylene glycol packet Commonly known as:  MIRALAX / GLYCOLAX Take 17 g by mouth daily.   RISA-BID PROBIOTIC Tabs Take 1 tablet by mouth 2 (two) times daily.   VITAMIN D-1000 MAX ST 1000 units tablet Generic drug:  Cholecalciferol Take 2,000 Units by mouth daily.         Diet and Activity recommendation: See Discharge Instructions above   Consults obtained -GI   Major procedures and Radiology Reports - PLEASE review detailed and final reports for all details, in brief -      US Venous Img Upper Uni Right  Result Date: 06/02/2017 CLINICAL DATA:  Right upper extremity pain and edema for the past day. Evaluate for DVT. EXAM: RIGHT UPPER EXTREMITY VENOUS DOPPLER ULTRASOUND TECHNIQUE: Gray-scale sonography with graded compression, as well as color Doppler and duplex ultrasound were performed to evaluate the upper extremity deep venous system from the level of the subclavian vein and including the jugular, axillary, basilic, radial, ulnar and upper cephalic vein. Spectral Doppler was utilized to evaluate flow at rest and with distal augmentation maneuvers. COMPARISON:  None. FINDINGS: Contralateral Subclavian Vein: Respiratory phasicity is  normal and symmetric with the symptomatic side. No evidence of thrombus. Normal compressibility. Internal Jugular Vein: No evidence of thrombus. Normal compressibility, respiratory phasicity and response to augmentation. Subclavian Vein: No evidence of thrombus. Normal compressibility, respiratory phasicity and response to augmentation. Axillary Vein: No evidence of thrombus. Normal compressibility, respiratory phasicity and response to augmentation. Cephalic Vein: No evidence of thrombus. Normal compressibility, respiratory phasicity and response to augmentation. Basilic Vein: No evidence of thrombus. Normal compressibility, respiratory phasicity and response to augmentation. Brachial Veins:  No evidence of thrombus. Normal compressibility, respiratory phasicity and response to augmentation. Radial Veins: No evidence of thrombus. Normal compressibility, respiratory phasicity and response to augmentation. Ulnar Veins: No evidence of thrombus. Normal compressibility, respiratory phasicity and response to augmentation. Venous Reflux:  None visualized. Other Findings:  None visualized. IMPRESSION: No evidence of DVT within the right upper extremity. Electronically Signed   By: Sandi Mariscal M.D.   On: 06/02/2017 12:32   Dg Chest Portable 1 View  Result Date: 05/30/2017 CLINICAL DATA:  weakness.  Colon cancer. EXAM: PORTABLE CHEST 1 VIEW COMPARISON:  Acute abdomen series at 23:19 FINDINGS: Remote right rib fractures. The Chin overlies the apices minimally. Suggestion of hyperinflation. Midline trachea. Mild cardiomegaly. No pleural effusion or pneumothorax. No congestive failure. Mild left base scarring. IMPRESSION: Cardiomegaly and probable hyperinflation.  No acute findings. Electronically Signed   By: Abigail Miyamoto M.D.   On: 05/30/2017 12:27    Micro Results     Recent Results (from the past 240 hour(s))  Urine culture     Status: Abnormal   Collection Time: 05/30/17 12:05 PM  Result Value Ref Range Status    Specimen Description   Final    URINE, CATHETERIZED Performed at [facility], [address] [place], [place] [address]    Special Requests   Final    NONE Performed at [facility], [address] [place], [place] [address]    Culture >=100,000 COLONIES/mL ESCHERICHIA COLI (A)  Final   Report Status 06/01/2017 FINAL  Final   Organism ID, Bacteria ESCHERICHIA COLI (A)  Final      Susceptibility   Escherichia coli - MIC*    AMPICILLIN >=32 RESISTANT Resistant     CEFAZOLIN <=4 SENSITIVE Sensitive     CEFTRIAXONE <=1 SENSITIVE Sensitive     CIPROFLOXACIN >=4 RESISTANT Resistant     GENTAMICIN <=1 SENSITIVE Sensitive     IMIPENEM <=0.25 SENSITIVE Sensitive     NITROFURANTOIN <=16 SENSITIVE Sensitive     TRIMETH/SULFA >=320 RESISTANT Resistant     AMPICILLIN/SULBACTAM 16 INTERMEDIATE Intermediate     PIP/TAZO <=4 SENSITIVE Sensitive     Extended ESBL NEGATIVE Sensitive     * >=100,000 COLONIES/mL ESCHERICHIA COLI  CULTURE, BLOOD (ROUTINE X 2) w Reflex to ID Panel     Status: None (Preliminary result)   Collection Time: 05/30/17 10:13 PM  Result Value Ref Range Status   Specimen Description BLOOD RIGHT HAND  Final   Special Requests   Final    BOTTLES DRAWN AEROBIC AND ANAEROBIC Blood Culture adequate volume   Culture   Final    NO GROWTH 2 DAYS Performed at [facility], [address] [place], [place] [address]    Report Status PENDING  Incomplete  CULTURE, BLOOD (ROUTINE X 2) w Reflex to ID Panel     Status: None (Preliminary result)   Collection Time: 05/30/17 10:28 PM  Result Value Ref Range Status   Specimen Description BLOOD RIGHT WRIST  Final   Special Requests   Final    BOTTLES DRAWN AEROBIC AND ANAEROBIC Blood Culture adequate volume   Culture   Final    NO GROWTH 2 DAYS Performed at [facility], [address] [place], [place] [address]    Report Status PENDING  Incomplete  MRSA PCR Screening     Status: None    Collection Time: 05/31/17  1:28 AM  Result Value Ref Range Status   MRSA by PCR NEGATIVE NEGATIVE Final  Comment:        The GeneXpert MRSA Assay (FDA approved for NASAL specimens only), is one component of a comprehensive MRSA colonization surveillance program. It is not intended to diagnose MRSA infection nor to guide or monitor treatment for MRSA infections. Performed at [facility], [address], [place], [place] [address]        Today   Subjective:   Beth Chen he is stable for discharge today.  Objective:   Blood pressure (!) 119/56, pulse 72, temperature 97.7 F (36.5 C), resp. rate 20, height 5\' 1"  (1.549 m), weight 64 kg (141 lb), SpO2 99 %.   Intake/Output Summary (Last 24 hours) at 06/02/2017 1312 Last data filed at 06/02/2017 1038 Gross per 24 hour  Intake 2429 ml  Output 1800 ml  Net 629 ml    Exam Awake Alert, Oriented x 3, No new F.N deficits, Normal affect Social Circle.AT,PERRAL Supple Neck,No JVD, No cervical lymphadenopathy appriciated.  Symmetrical Chest wall movement, Good air movement bilaterally, CTAB RRR,No Gallops,Rubs or new Murmurs, No Parasternal Heave +ve B.Sounds, Abd Soft, Non tender, No organomegaly appriciated, No rebound -guarding or rigidity. No Cyanosis, Clubbing or edema, No new Rash or bruise  Data Review   CBC w Diff:  Lab Results  Component Value Date   WBC 20.9 (H) 05/31/2017   HGB 8.2 (L) 05/31/2017   HGB 10.3 (L) 09/22/2011   HCT 21.4 (L) 05/31/2017   HCT 31.1 (L) 09/22/2011   PLT 268 05/31/2017   PLT 223 09/22/2011   LYMPHOPCT 3 05/30/2017   LYMPHOPCT 16.4 09/22/2011   BANDSPCT 0 04/27/2017   MONOPCT 6 05/30/2017   MONOPCT 9.5 09/22/2011   EOSPCT 1 05/30/2017   EOSPCT 7.5 09/22/2011   BASOPCT 1 05/30/2017   BASOPCT 0.6 09/22/2011    CMP:  Lab Results  Component Value Date   NA 135 05/31/2017   NA 143 09/22/2011   K 4.3 05/31/2017   K 3.4 (L) 09/22/2011   CL 103 05/31/2017   CL 112 (H)  09/22/2011   CO2 24 05/31/2017   CO2 23 09/22/2011   BUN 37 (H) 05/31/2017   BUN 12 09/22/2011   CREATININE 0.85 05/31/2017   CREATININE 0.93 09/22/2011   PROT 5.6 (L) 05/30/2017   PROT 5.2 (L) 09/21/2011   ALBUMIN 2.8 (L) 05/30/2017   ALBUMIN 2.6 (L) 09/21/2011   BILITOT 0.7 05/30/2017   BILITOT 0.4 09/21/2011   ALKPHOS 54 05/30/2017   ALKPHOS 57 09/21/2011   AST 45 (H) 05/30/2017   AST 22 09/21/2011   ALT 11 (L) 05/30/2017   ALT 13 09/21/2011  .   Total Time in preparing paper work, data evaluation and todays exam - 11 minutes  Epifanio Lesches M.D on 06/02/2017 at 1:12 PM    Note: This dictation was prepared with Dragon dictation along with smaller phrase technology. Any transcriptional errors that result from this process are unintentional.

## 2017-06-02 NOTE — Progress Notes (Signed)
Call report to [facility]/ report given to [facility] B, LPN.

## 2017-06-02 NOTE — Care Management Important Message (Signed)
Important Message  Patient Details  Name: Beth Chen MRN: [id number] Date of Birth: Jun 16, 1921   Medicare Important Message Given:  Yes    Beverly Sessions, RN 06/02/2017, 2:15 PM

## 2017-06-04 ENCOUNTER — Encounter: Payer: Self-pay | Admitting: Gastroenterology

## 2017-06-05 LAB — CULTURE, BLOOD (ROUTINE X 2)
Culture: NO GROWTH
Culture: NO GROWTH
Special Requests: ADEQUATE
Special Requests: ADEQUATE

## 2017-06-07 ENCOUNTER — Other Ambulatory Visit
Admission: RE | Admit: 2017-06-07 | Discharge: 2017-06-07 | Disposition: A | Discharge status: Home / Self Care | Payer: Medicare Other | Source: Ambulatory Visit | Attending: Gerontology | Admitting: Gerontology

## 2017-06-07 DIAGNOSIS — D631 Anemia in chronic kidney disease: Secondary | ICD-10-CM | POA: Diagnosis present

## 2017-06-07 DIAGNOSIS — N189 Chronic kidney disease, unspecified: Secondary | ICD-10-CM | POA: Insufficient documentation

## 2017-06-07 LAB — CBC WITH DIFFERENTIAL/PLATELET
BASOS ABS: 0.1 10*3/uL (ref 0–0.1)
Basophils Relative: 1 %
EOS PCT: 4 %
Eosinophils Absolute: 0.6 10*3/uL (ref 0–0.7)
HEMATOCRIT: 22.9 % — AB (ref 35.0–47.0)
HEMOGLOBIN: 7.2 g/dL — AB (ref 12.0–16.0)
LYMPHS ABS: 1.4 10*3/uL (ref 1.0–3.6)
LYMPHS PCT: 10 %
MCH: 26.7 pg (ref 26.0–34.0)
MCHC: 31.7 g/dL — AB (ref 32.0–36.0)
MCV: 84.2 fL (ref 80.0–100.0)
MONOS PCT: 8 %
Monocytes Absolute: 1.1 10*3/uL — ABNORMAL HIGH (ref 0.2–0.9)
Neutro Abs: 10.6 10*3/uL — ABNORMAL HIGH (ref 1.4–6.5)
Neutrophils Relative %: 77 %
Platelets: 366 10*3/uL (ref 150–440)
RBC: 2.72 MIL/uL — AB (ref 3.80–5.20)
RDW: 21.7 % — ABNORMAL HIGH (ref 11.5–14.5)
WBC: 13.8 10*3/uL — AB (ref 3.6–11.0)

## 2017-06-07 LAB — COMPREHENSIVE METABOLIC PANEL
ALBUMIN: 2.4 g/dL — AB (ref 3.5–5.0)
ALK PHOS: 64 U/L (ref 38–126)
ALT: 13 U/L — ABNORMAL LOW (ref 14–54)
AST: 18 U/L (ref 15–41)
Anion gap: 7 (ref 5–15)
BILIRUBIN TOTAL: 0.6 mg/dL (ref 0.3–1.2)
BUN: 18 mg/dL (ref 6–20)
CALCIUM: 8.2 mg/dL — AB (ref 8.9–10.3)
CO2: 28 mmol/L (ref 22–32)
CREATININE: 0.93 mg/dL (ref 0.44–1.00)
Chloride: 102 mmol/L (ref 101–111)
GFR calc Af Amer: 60 mL/min — ABNORMAL LOW (ref >60)
GFR calc non Af Amer: 51 mL/min — ABNORMAL LOW (ref >60)
GLUCOSE: 89 mg/dL (ref 65–99)
Potassium: 4 mmol/L (ref 3.5–5.1)
SODIUM: 137 mmol/L (ref 135–145)
TOTAL PROTEIN: 4.8 g/dL — AB (ref 6.5–8.1)

## 2017-06-09 ENCOUNTER — Encounter: Payer: Self-pay | Admitting: Gerontology

## 2017-06-09 ENCOUNTER — Non-Acute Institutional Stay (SKILLED_NURSING_FACILITY): Payer: Medicare Other | Admitting: Gerontology

## 2017-06-09 DIAGNOSIS — S61411A Laceration without foreign body of right hand, initial encounter: Secondary | ICD-10-CM

## 2017-06-09 DIAGNOSIS — R531 Weakness: Secondary | ICD-10-CM | POA: Diagnosis not present

## 2017-06-09 DIAGNOSIS — D500 Iron deficiency anemia secondary to blood loss (chronic): Secondary | ICD-10-CM

## 2017-06-09 NOTE — Progress Notes (Signed)
Location:   [facility] Room Number: [phone] Place of Service:  SNF [id number]) Provider:  Toni Arthurs, NP-C  Kirk Ruths, MD  Patient Care Team: Kirk Ruths, MD as PCP - General (Internal Medicine)  Extended Emergency Contact Information Primary Emergency Contact: Dondra Spry [place] of [place] Phone: [phone] Mobile Phone: [phone] Relation: Daughter Secondary Emergency Contact: Costella Hatcher [place] Phone: [phone] Relation: Son  Code Status:  DNR Goals of care: Advanced Directive information Advanced Directives 06/09/2017  Does Patient Have a Medical Advance Directive? Yes  Type of Paramedic of [place];Out of facility DNR (pink MOST or yellow form);Living will  Does patient want to make changes to medical advance directive? No - Patient declined  Copy of [place] in Chart? Yes  Would patient like information on creating a medical advance directive? -     Chief Complaint  Patient presents with  . Medical Management of Chronic Issues    Routine Visit    HPI:  Pt is a 84 y.o. female seen today for medical management of chronic diseases. Pt was recently discharged back to the facility from [facility] after admission for anemia and UTI. Pt had EGD, got transfused with 2 units PRBcs and received IV antibiotics for UTI. Pt reports she is feeling much better now and continues to work with therapy. Several days ago, pt hit her hand on the door casing while being transferred into the bathroom via the mechanical lift. She sustained a large skin tear on the back of the right hand. The skin tear measures ~6 cm in length. The tissue edges are rolled, not reduceable. Dry appearing. Wound bed is beefy red with minor bleeding/oozing. No purulence, no odor, no periwound tissue erythema or bruising. No s/s of infection. Pt reports the wound is not overly painful.  Initially, the hand was wrapped in a ABD pad and Kerlix. Will adjust dressing change orders. Otherwise, pt reports she is feeling well. VSS. No other complaints.         Past Medical History:  Diagnosis Date  . Allergic rhinitis 11/18/2013  . Asthma with COPD (chronic obstructive pulmonary disease) ([place]) 11/18/2013   Last Assessment & Plan:  No flair ups noted recently and breathing is essentially at baseline.    . Asthma without status asthmaticus    unspecified  . Borderline hypothyroidism 11/18/2013   Last Assessment & Plan:  Energy and tsh are doing well  . Cancer [facility])    colon cancer- surgically removed in 2006  . Cholelithiases   . Chronic kidney disease   . Colon cancer ([place])   . Complete left bundle branch block (LBBB) 11/18/2013  . Constipation 11/18/2013  . Diverticulosis   . Hiatal hernia    right  . HLD (hyperlipidemia) 11/18/2013   Last Assessment & Plan:  Appropriate diet is followed and no myalgia's are noted.    . Hyperlipidemia, unspecified   . Hypertension   . Hypertensive kidney disease with CKD stage III ([place]) 11/18/2013   Last Assessment & Plan:  Is compliant with hypertensive medications without clear side effects or lack of control.  Nausea and itching are not symptomatic and nsaids are being avoided.    . Melanoma ([place])   . Osteopenia    Past Surgical History:  Procedure Laterality Date  . ABDOMINAL HYSTERECTOMY    . Bladder Lift    . BREAST EXCISIONAL BIOPSY Left 15+ yrs ago  neg  . COLON RESECTION  2006   colon cancer  . COLON SURGERY    . COLON SURGERY Right   . COLONOSCOPY WITH PROPOFOL N/A 06/01/2017   Procedure: COLONOSCOPY WITH PROPOFOL;  Surgeon: [name], MD;  Location: [facility] ENDOSCOPY;  Service: Endoscopy;  Laterality: N/A;  . ESOPHAGOGASTRODUODENOSCOPY (EGD) WITH PROPOFOL N/A 06/01/2017   Procedure: ESOPHAGOGASTRODUODENOSCOPY (EGD) WITH PROPOFOL;  Surgeon: [name], MD;  Location: [facility] ENDOSCOPY;  Service: Endoscopy;  Laterality: N/A;  .  HEMICOLECTOMY Right   . SKIN CANCER EXCISION  2006   melanoma    No Known Allergies  Allergies as of 06/09/2017   No Known Allergies     Medication List        Accurate as of 06/09/17  3:59 PM. Always use your most recent med list.          bisacodyl 10 MG suppository Commonly known as:  DULCOLAX Place 10 mg rectally 2 (two) times daily as needed.   calcium carbonate 750 MG chewable tablet Commonly known as:  TUMS EX Chew 2 tablets by mouth daily.   cephALEXin 500 MG capsule Commonly known as:  KEFLEX Take 1 capsule (500 mg total) by mouth 2 (two) times daily for 10 days. For 10 days   cyanocobalamin 1000 MCG tablet Take 2,000 mcg by mouth daily.   docusate sodium 100 MG capsule Commonly known as:  COLACE Take 1 capsule (100 mg total) by mouth daily as needed.   ENSURE ENLIVE PO Take 1 Bottle by mouth 2 (two) times daily between meals.   feeding supplement (PRO-STAT SUGAR FREE 64) Liqd Take 30 mLs by mouth 2 (two) times daily between meals. low protein, low albumin   ferrous sulfate 325 (65 FE) MG tablet Take 325 mg by mouth 2 (two) times daily.   fluticasone 50 MCG/ACT nasal spray Commonly known as:  FLONASE USE 2 SPRAYS IN EACH       NOSTRIL DAILY   lactulose 10 GM/15ML solution Commonly known as:  CHRONULAC Take 30 g by mouth 2 (two) times daily as needed.   losartan 25 MG tablet Commonly known as:  COZAAR Take 25 mg by mouth daily. Take 1 tablet PRN if BP is >150/90   pantoprazole 40 MG tablet Commonly known as:  PROTONIX Take 40 mg by mouth 2 (two) times daily. before a meal to help with esophagus problems   polyethylene glycol packet Commonly known as:  MIRALAX / GLYCOLAX Take 17 g by mouth daily.   RISA-BID PROBIOTIC Tabs Take 1 tablet by mouth 2 (two) times daily.   VITAMIN D-1000 MAX ST 1000 units tablet Generic drug:  Cholecalciferol Take 2,000 Units by mouth daily.       Review of Systems  Constitutional: Negative for activity  change, appetite change, chills, diaphoresis and fever.  HENT: Negative for congestion, mouth sores, nosebleeds, postnasal drip, sneezing, sore throat, trouble swallowing and voice change.   Respiratory: Negative for apnea, cough, choking, chest tightness, shortness of breath and wheezing.   Cardiovascular: Negative for chest pain, palpitations and leg swelling.  Gastrointestinal: Negative for abdominal distention, abdominal pain, constipation, diarrhea and nausea.  Genitourinary: Negative for difficulty urinating, dysuria, frequency and urgency.  Musculoskeletal: Positive for arthralgias (typical arthritis) and gait problem. Negative for back pain and myalgias.  Skin: Positive for wound. Negative for color change, pallor and rash.  Neurological: Positive for weakness. Negative for dizziness, tremors, syncope, speech difficulty, numbness and headaches.  Psychiatric/Behavioral: Negative for agitation and behavioral problems.  All other systems reviewed and are negative.   Immunization History  Administered Date(s) Administered  . PPD Test 09/09/2016  . Pneumococcal Conjugate-13 12/16/2016  . Pneumococcal-Unspecified 11/28/2008   Pertinent  Health Maintenance Due  Topic Date Due  . DEXA SCAN  11/28/1988  . INFLUENZA VACCINE  09/30/2017  . PNA vac Low Risk Adult  Completed   No flowsheet data found. Functional Status Survey:    Vitals:   06/09/17 1545  BP: 117/75  Pulse: (!) 103  Resp: 18  Temp: 97.9 F (36.6 C)  TempSrc: Oral  SpO2: 96%  Weight: 142 lb 11.2 oz (64.7 kg)  Height: 5\' 1"  (1.549 m)   Body mass index is 26.96 kg/m. Physical Exam  Constitutional: She is oriented to person, place, and time. Vital signs are normal. She appears well-developed and well-nourished. She is active and cooperative. She does not appear ill. No distress.  HENT:  Head: Normocephalic and atraumatic.  Mouth/Throat: Uvula is midline, oropharynx is clear and moist and mucous membranes are  normal. Mucous membranes are not pale, not dry and not cyanotic.  Eyes: Pupils are equal, round, and reactive to light. Conjunctivae, EOM and lids are normal.  Neck: Trachea normal, normal range of motion and full passive range of motion without pain. Neck supple. No JVD present. No tracheal deviation, no edema and no erythema present. No thyromegaly present.  Cardiovascular: Normal rate, regular rhythm, intact distal pulses and normal pulses. Exam reveals distant heart sounds. Exam reveals no gallop and no friction rub.  Murmur heard. Pulses:      Dorsalis pedis pulses are 2+ on the right side, and 2+ on the left side.  2+ BLE edema  Pulmonary/Chest: Effort normal and breath sounds normal. No accessory muscle usage. No respiratory distress. She has no decreased breath sounds. She has no wheezes. She has no rhonchi. She has no rales. She exhibits no tenderness.  Abdominal: Soft. Normal appearance and bowel sounds are normal. She exhibits no distension and no ascites. There is no tenderness.  Musculoskeletal: Normal range of motion. She exhibits no edema or tenderness.  Expected osteoarthritis, stiffness; Bilateral Calves soft, supple. Negative Homan's Sign. B- pedal pulses equal; generalized weakness  Neurological: She is alert and oriented to person, place, and time. She has normal strength. She exhibits abnormal muscle tone. Coordination and gait abnormal.  Skin: Skin is warm and dry. Laceration (skin tear right hand) noted. She is not diaphoretic. No cyanosis. No pallor. Nails show no clubbing.  Psychiatric: She has a normal mood and affect. Her speech is normal and behavior is normal. Judgment and thought content normal. Cognition and memory are impaired. She exhibits abnormal recent memory.  Nursing note and vitals reviewed.   Labs reviewed: Recent Labs    04/20/17 1505  05/30/17 1127 05/31/17 0039 06/07/17 0530  NA 133*   < > 133* 135 137  K 3.6   < > 4.6 4.3 4.0  CL 93*   < > 100*  103 102  CO2 29   < > 17* 24 28  GLUCOSE 116*   < > 119* 106* 89  BUN 39*   < > 43* 37* 18  CREATININE 0.93   < > 1.08* 0.85 0.93  CALCIUM 10.0   < > 8.5* 7.6* 8.2*  MG 1.8  --   --   --   --    < > = values in this interval not displayed.   Recent Labs    05/01/17 0950 05/30/17  1127 06/07/17 0530  AST 26 45* 18  ALT 14 11* 13*  ALKPHOS 91 54 64  BILITOT 0.6 0.7 0.6  PROT 5.7* 5.6* 4.8*  ALBUMIN 3.1* 2.8* 2.4*   Recent Labs    04/27/17 1110  05/30/17 1127 05/30/17 2213 05/31/17 0039 05/31/17 0627 05/31/17 1514 06/07/17 0530  WBC 19.3*   < > 34.8*  --  20.9*  --   --  13.8*  NEUTROABS 14.4*  --  31.1*  --   --   --   --  10.6*  HGB 10.4*   < > 4.8* 7.8* 7.3* 7.8* 8.2* 7.2*  HCT 33.7*   < > 16.2* 23.9* 21.4*  --   --  22.9*  MCV 74.5*   < > 79.2*  --  81.1  --   --  84.2  PLT 542*   < > 357  --  268  --   --  366   < > = values in this interval not displayed.   Lab Results  Component Value Date   TSH 3.269 04/20/2017   No results found for: HGBA1C Lab Results  Component Value Date   CHOL 127 04/20/2017   HDL 74 04/20/2017   LDLCALC 40 04/20/2017   TRIG 66 04/20/2017   CHOLHDL 1.7 04/20/2017    Significant Diagnostic Results in last 30 days:  US Venous Img Upper Uni Right  Result Date: 06/02/2017 CLINICAL DATA:  Right upper extremity pain and edema for the past day. Evaluate for DVT. EXAM: RIGHT UPPER EXTREMITY VENOUS DOPPLER ULTRASOUND TECHNIQUE: Gray-scale sonography with graded compression, as well as color Doppler and duplex ultrasound were performed to evaluate the upper extremity deep venous system from the level of the subclavian vein and including the jugular, axillary, basilic, radial, ulnar and upper cephalic vein. Spectral Doppler was utilized to evaluate flow at rest and with distal augmentation maneuvers. COMPARISON:  None. FINDINGS: Contralateral Subclavian Vein: Respiratory phasicity is normal and symmetric with the symptomatic side. No evidence of  thrombus. Normal compressibility. Internal Jugular Vein: No evidence of thrombus. Normal compressibility, respiratory phasicity and response to augmentation. Subclavian Vein: No evidence of thrombus. Normal compressibility, respiratory phasicity and response to augmentation. Axillary Vein: No evidence of thrombus. Normal compressibility, respiratory phasicity and response to augmentation. Cephalic Vein: No evidence of thrombus. Normal compressibility, respiratory phasicity and response to augmentation. Basilic Vein: No evidence of thrombus. Normal compressibility, respiratory phasicity and response to augmentation. Brachial Veins: No evidence of thrombus. Normal compressibility, respiratory phasicity and response to augmentation. Radial Veins: No evidence of thrombus. Normal compressibility, respiratory phasicity and response to augmentation. Ulnar Veins: No evidence of thrombus. Normal compressibility, respiratory phasicity and response to augmentation. Venous Reflux:  None visualized. Other Findings:  None visualized. IMPRESSION: No evidence of DVT within the right upper extremity. Electronically Signed   By: Sandi Mariscal M.D.   On: 06/02/2017 12:32   Dg Chest Portable 1 View  Result Date: 05/30/2017 CLINICAL DATA:  weakness.  Colon cancer. EXAM: PORTABLE CHEST 1 VIEW COMPARISON:  Acute abdomen series at 23:19 FINDINGS: Remote right rib fractures. The Chin overlies the apices minimally. Suggestion of hyperinflation. Midline trachea. Mild cardiomegaly. No pleural effusion or pneumothorax. No congestive failure. Mild left base scarring. IMPRESSION: Cardiomegaly and probable hyperinflation.  No acute findings. Electronically Signed   By: Abigail Miyamoto M.D.   On: 05/30/2017 12:27    Assessment/Plan  Iron deficiency anemia due to chronic blood loss  Continue Ferrous Sulfate 325 mg po BID  Continue Vitamin C 500 mg tablets PO BID to potentiate iron absorption  Generalized weakness  Continue  PT/OT  Continue to assist with ADLs as appropriate  Safety precautions  Fall precautions  Skin tear of hand without complication, right, initial encounter  Pt is still on Keflex 500 mg po BID from UTI  Apply Xeroform gauze to the skin tear, cover with gauze, wrap with kerlix. Change daily  Monitor closely for s/s of infection  Continue Pro-stat 30 mL PO BID for protein supplementation  Continue Ensure Enlive po BID for nutritional support  Family/ staff Communication:   Total Time:  Documentation:  Face to Face:  Family/Phone: daughter at bedside   Labs/tests ordered:    Medication list reviewed and assessed for continued appropriateness. Monthly medication orders reviewed and signed.  Vikki Ports, NP-C [facility] [phone], [place] [address] Cell Phone (Mon-Fri 8am-5pm):  [phone] On Call:  [phone] & follow prompts after 5pm & weekends Office Phone:  [phone] Office Fax:  [phone]

## 2017-06-15 ENCOUNTER — Other Ambulatory Visit
Admission: RE | Admit: 2017-06-15 | Discharge: 2017-06-15 | Disposition: A | Discharge status: Home / Self Care | Payer: Medicare Other | Source: Ambulatory Visit | Attending: Internal Medicine | Admitting: Internal Medicine

## 2017-06-15 DIAGNOSIS — N189 Chronic kidney disease, unspecified: Secondary | ICD-10-CM | POA: Insufficient documentation

## 2017-06-15 DIAGNOSIS — D631 Anemia in chronic kidney disease: Secondary | ICD-10-CM | POA: Diagnosis present

## 2017-06-15 LAB — CBC WITH DIFFERENTIAL/PLATELET
BASOS PCT: 0 %
Band Neutrophils: 2 %
Basophils Absolute: 0 10*3/uL (ref 0–0.1)
Blasts: 0 %
EOS PCT: 4 %
Eosinophils Absolute: 0.4 10*3/uL (ref 0–0.7)
HCT: 25.3 % — ABNORMAL LOW (ref 35.0–47.0)
Hemoglobin: 8 g/dL — ABNORMAL LOW (ref 12.0–16.0)
LYMPHS ABS: 1.5 10*3/uL (ref 1.0–3.6)
LYMPHS PCT: 17 %
MCH: 27.6 pg (ref 26.0–34.0)
MCHC: 31.6 g/dL — ABNORMAL LOW (ref 32.0–36.0)
MCV: 87.5 fL (ref 80.0–100.0)
MONO ABS: 0.5 10*3/uL (ref 0.2–0.9)
Metamyelocytes Relative: 1 %
Monocytes Relative: 6 %
Myelocytes: 3 %
NEUTROS PCT: 67 %
NRBC: 0 /100{WBCs}
Neutro Abs: 6.5 10*3/uL (ref 1.4–6.5)
OTHER: 0 %
PLATELETS: 357 10*3/uL (ref 150–440)
Promyelocytes Relative: 0 %
RBC: 2.89 MIL/uL — ABNORMAL LOW (ref 3.80–5.20)
RDW: 22.3 % — ABNORMAL HIGH (ref 11.5–14.5)
WBC: 8.9 10*3/uL (ref 3.6–11.0)

## 2017-06-21 ENCOUNTER — Other Ambulatory Visit
Admission: RE | Admit: 2017-06-21 | Discharge: 2017-06-21 | Disposition: A | Discharge status: Home / Self Care | Payer: Medicare Other | Source: Skilled Nursing Facility | Attending: Gerontology | Admitting: Gerontology

## 2017-06-21 DIAGNOSIS — J449 Chronic obstructive pulmonary disease, unspecified: Secondary | ICD-10-CM | POA: Diagnosis present

## 2017-06-21 LAB — COMPREHENSIVE METABOLIC PANEL
ALK PHOS: 72 U/L (ref 38–126)
ALT: 10 U/L — AB (ref 14–54)
ANION GAP: 11 (ref 5–15)
AST: 24 U/L (ref 15–41)
Albumin: 3 g/dL — ABNORMAL LOW (ref 3.5–5.0)
BUN: 21 mg/dL — ABNORMAL HIGH (ref 6–20)
CHLORIDE: 98 mmol/L — AB (ref 101–111)
CO2: 24 mmol/L (ref 22–32)
CREATININE: 0.84 mg/dL (ref 0.44–1.00)
Calcium: 8.2 mg/dL — ABNORMAL LOW (ref 8.9–10.3)
GFR, EST NON AFRICAN AMERICAN: 58 mL/min — AB (ref >60)
Glucose, Bld: 109 mg/dL — ABNORMAL HIGH (ref 65–99)
Potassium: 3.4 mmol/L — ABNORMAL LOW (ref 3.5–5.1)
Sodium: 133 mmol/L — ABNORMAL LOW (ref 135–145)
Total Bilirubin: 0.2 mg/dL — ABNORMAL LOW (ref 0.3–1.2)
Total Protein: 5.7 g/dL — ABNORMAL LOW (ref 6.5–8.1)

## 2017-06-21 LAB — CBC WITH DIFFERENTIAL/PLATELET
Band Neutrophils: 4 %
Basophils Absolute: 0 10*3/uL (ref 0–0.1)
Basophils Relative: 0 %
Blasts: 0 %
EOS PCT: 3 %
Eosinophils Absolute: 0.2 10*3/uL (ref 0–0.7)
HCT: 29.9 % — ABNORMAL LOW (ref 35.0–47.0)
Hemoglobin: 9.5 g/dL — ABNORMAL LOW (ref 12.0–16.0)
LYMPHS ABS: 0.3 10*3/uL — AB (ref 1.0–3.6)
LYMPHS PCT: 4 %
MCH: 28.4 pg (ref 26.0–34.0)
MCHC: 31.9 g/dL — AB (ref 32.0–36.0)
MCV: 89.1 fL (ref 80.0–100.0)
MONO ABS: 0.6 10*3/uL (ref 0.2–0.9)
MONOS PCT: 8 %
MYELOCYTES: 0 %
Metamyelocytes Relative: 2 %
NEUTROS PCT: 78 %
NRBC: 0 /100{WBCs}
Neutro Abs: 6.2 10*3/uL (ref 1.4–6.5)
OTHER: 0 %
PLATELETS: 452 10*3/uL — AB (ref 150–440)
Promyelocytes Relative: 1 %
RBC: 3.36 MIL/uL — AB (ref 3.80–5.20)
RDW: 21.9 % — ABNORMAL HIGH (ref 11.5–14.5)
WBC: 7.3 10*3/uL (ref 3.6–11.0)

## 2017-06-27 ENCOUNTER — Other Ambulatory Visit
Admission: RE | Admit: 2017-06-27 | Discharge: 2017-06-27 | Disposition: A | Discharge status: Home / Self Care | Payer: Medicare Other | Source: Ambulatory Visit | Attending: Internal Medicine | Admitting: Internal Medicine

## 2017-06-27 DIAGNOSIS — D500 Iron deficiency anemia secondary to blood loss (chronic): Secondary | ICD-10-CM | POA: Diagnosis present

## 2017-06-27 DIAGNOSIS — D631 Anemia in chronic kidney disease: Secondary | ICD-10-CM | POA: Diagnosis present

## 2017-06-27 DIAGNOSIS — N189 Chronic kidney disease, unspecified: Secondary | ICD-10-CM | POA: Insufficient documentation

## 2017-06-27 LAB — URINALYSIS, COMPLETE (UACMP) WITH MICROSCOPIC
Bilirubin Urine: NEGATIVE
GLUCOSE, UA: NEGATIVE mg/dL
Hgb urine dipstick: NEGATIVE
KETONES UR: 5 mg/dL — AB
NITRITE: POSITIVE — AB
PH: 6 (ref 5.0–8.0)
Protein, ur: 30 mg/dL — AB
SPECIFIC GRAVITY, URINE: 1.017 (ref 1.005–1.030)
Squamous Epithelial / LPF: NONE SEEN (ref 0–5)

## 2017-06-28 LAB — COMPREHENSIVE METABOLIC PANEL
ALBUMIN: 2.8 g/dL — AB (ref 3.5–5.0)
ALT: 11 U/L — ABNORMAL LOW (ref 14–54)
ANION GAP: 7 (ref 5–15)
AST: 19 U/L (ref 15–41)
Alkaline Phosphatase: 60 U/L (ref 38–126)
BILIRUBIN TOTAL: 0.7 mg/dL (ref 0.3–1.2)
BUN: 28 mg/dL — ABNORMAL HIGH (ref 6–20)
CHLORIDE: 95 mmol/L — AB (ref 101–111)
CO2: 30 mmol/L (ref 22–32)
Calcium: 7.9 mg/dL — ABNORMAL LOW (ref 8.9–10.3)
Creatinine, Ser: 0.82 mg/dL (ref 0.44–1.00)
GFR calc Af Amer: >60 mL/min (ref >60)
GFR calc non Af Amer: 60 mL/min — ABNORMAL LOW (ref >60)
Glucose, Bld: 89 mg/dL (ref 65–99)
POTASSIUM: 3.4 mmol/L — AB (ref 3.5–5.1)
SODIUM: 132 mmol/L — AB (ref 135–145)
Total Protein: 5.2 g/dL — ABNORMAL LOW (ref 6.5–8.1)

## 2017-06-29 ENCOUNTER — Other Ambulatory Visit
Admission: RE | Admit: 2017-06-29 | Discharge: 2017-06-29 | Disposition: A | Discharge status: Home / Self Care | Payer: Medicare Other | Source: Ambulatory Visit | Attending: Internal Medicine | Admitting: Internal Medicine

## 2017-06-29 DIAGNOSIS — D649 Anemia, unspecified: Secondary | ICD-10-CM | POA: Insufficient documentation

## 2017-06-29 LAB — CBC WITH DIFFERENTIAL/PLATELET
BAND NEUTROPHILS: 2 %
BASOS ABS: 0.1 10*3/uL (ref 0–0.1)
BASOS PCT: 1 %
BLASTS: 0 %
EOS ABS: 0.1 10*3/uL (ref 0–0.7)
Eosinophils Relative: 1 %
HEMATOCRIT: 29 % — AB (ref 35.0–47.0)
HEMOGLOBIN: 9.5 g/dL — AB (ref 12.0–16.0)
Lymphocytes Relative: 14 %
Lymphs Abs: 1.2 10*3/uL (ref 1.0–3.6)
MCH: 28.1 pg (ref 26.0–34.0)
MCHC: 32.7 g/dL (ref 32.0–36.0)
MCV: 85.9 fL (ref 80.0–100.0)
METAMYELOCYTES PCT: 1 %
MONO ABS: 1.1 10*3/uL — AB (ref 0.2–0.9)
Monocytes Relative: 12 %
Myelocytes: 0 %
Neutro Abs: 6.4 10*3/uL (ref 1.4–6.5)
Neutrophils Relative %: 69 %
OTHER: 0 %
PROMYELOCYTES RELATIVE: 0 %
Platelets: 395 10*3/uL (ref 150–440)
RBC: 3.37 MIL/uL — ABNORMAL LOW (ref 3.80–5.20)
RDW: 19.1 % — AB (ref 11.5–14.5)
WBC: 8.9 10*3/uL (ref 3.6–11.0)
nRBC: 0 /100 WBC

## 2017-06-29 LAB — URINE CULTURE

## 2017-06-30 ENCOUNTER — Encounter
Admission: RE | Admit: 2017-06-30 | Discharge: 2017-06-30 | Disposition: A | Discharge status: Home / Self Care | Payer: Medicare Other | Source: Ambulatory Visit | Attending: Internal Medicine | Admitting: Internal Medicine

## 2017-07-13 ENCOUNTER — Emergency Department
Admission: EM | Admit: 2017-07-13 | Discharge: 2017-07-13 | Disposition: A | Discharge status: Home / Self Care | Payer: Medicare Other | Attending: Emergency Medicine | Admitting: Emergency Medicine

## 2017-07-13 ENCOUNTER — Emergency Department: Payer: Medicare Other

## 2017-07-13 ENCOUNTER — Encounter: Payer: Self-pay | Admitting: Emergency Medicine

## 2017-07-13 ENCOUNTER — Other Ambulatory Visit: Payer: Self-pay

## 2017-07-13 DIAGNOSIS — Y999 Unspecified external cause status: Secondary | ICD-10-CM | POA: Diagnosis not present

## 2017-07-13 DIAGNOSIS — I129 Hypertensive chronic kidney disease with stage 1 through stage 4 chronic kidney disease, or unspecified chronic kidney disease: Secondary | ICD-10-CM | POA: Diagnosis not present

## 2017-07-13 DIAGNOSIS — S32010A Wedge compression fracture of first lumbar vertebra, initial encounter for closed fracture: Secondary | ICD-10-CM

## 2017-07-13 DIAGNOSIS — Z85038 Personal history of other malignant neoplasm of large intestine: Secondary | ICD-10-CM | POA: Diagnosis not present

## 2017-07-13 DIAGNOSIS — S3992XA Unspecified injury of lower back, initial encounter: Secondary | ICD-10-CM | POA: Diagnosis present

## 2017-07-13 DIAGNOSIS — J449 Chronic obstructive pulmonary disease, unspecified: Secondary | ICD-10-CM | POA: Diagnosis not present

## 2017-07-13 DIAGNOSIS — Y929 Unspecified place or not applicable: Secondary | ICD-10-CM | POA: Diagnosis not present

## 2017-07-13 DIAGNOSIS — Z79899 Other long term (current) drug therapy: Secondary | ICD-10-CM | POA: Insufficient documentation

## 2017-07-13 DIAGNOSIS — M545 Low back pain, unspecified: Secondary | ICD-10-CM

## 2017-07-13 DIAGNOSIS — S32018A Other fracture of first lumbar vertebra, initial encounter for closed fracture: Secondary | ICD-10-CM | POA: Diagnosis not present

## 2017-07-13 DIAGNOSIS — W01198A Fall on same level from slipping, tripping and stumbling with subsequent striking against other object, initial encounter: Secondary | ICD-10-CM | POA: Diagnosis not present

## 2017-07-13 DIAGNOSIS — N3000 Acute cystitis without hematuria: Secondary | ICD-10-CM | POA: Insufficient documentation

## 2017-07-13 DIAGNOSIS — N183 Chronic kidney disease, stage 3 (moderate): Secondary | ICD-10-CM | POA: Insufficient documentation

## 2017-07-13 DIAGNOSIS — Y939 Activity, unspecified: Secondary | ICD-10-CM | POA: Insufficient documentation

## 2017-07-13 LAB — URINALYSIS, COMPLETE (UACMP) WITH MICROSCOPIC
Bilirubin Urine: NEGATIVE
Glucose, UA: NEGATIVE mg/dL
Hgb urine dipstick: NEGATIVE
KETONES UR: NEGATIVE mg/dL
Nitrite: NEGATIVE
PROTEIN: NEGATIVE mg/dL
SQUAMOUS EPITHELIAL / LPF: NONE SEEN (ref 0–5)
Specific Gravity, Urine: 1.021 (ref 1.005–1.030)
pH: 5 (ref 5.0–8.0)

## 2017-07-13 MED ORDER — SULFAMETHOXAZOLE-TRIMETHOPRIM 800-160 MG PO TABS
1.0000 | ORAL_TABLET | Freq: Once | ORAL | Status: AC
  Administered 2017-07-13: 1 via ORAL
  Filled 2017-07-13: qty 1

## 2017-07-13 MED ORDER — SULFAMETHOXAZOLE-TRIMETHOPRIM 800-160 MG PO TABS: 1.0000 | ORAL_TABLET | Freq: Two times a day (BID) | ORAL | 0 refills | Status: DC

## 2017-07-13 NOTE — ED Triage Notes (Signed)
Pt to triage via w/c with no distress; family reports approx 6pm pt fell coming out of BR hitting back on wall and slid to floor; c/o pain to right side back; denies any other c/o or injuries

## 2017-07-13 NOTE — Discharge Instructions (Addendum)
Please take Tylenol 500 mg - 1000 mg every 6 hours as needed for pain.  Take Bactrim DS 1 tab 2 times daily for 7 days.  Please follow-up with neurosurgeon to review new x-rays and also to reevaluate your pain.  Return to the ER for any increasing fevers, increasing back pain, confusion, worsening symptoms or urgent changes in health.

## 2017-07-13 NOTE — ED Provider Notes (Signed)
[facility] EMERGENCY DEPARTMENT Provider Note   CSN: [id number] Arrival date & time: 07/13/17  1929     History   Chief Complaint Chief Complaint  Patient presents with  . Back Pain    HPI Beth Chen is a 84 y.o. female presents with her daughter and son for evaluation of right rib and mid back pain.  Around 6 PM tonight patient was walking out of the bathroom with her walker, misstepped and fell backwards against the wall sliding down to the floor.  Patient denies hitting her head, losing consciousness, having a headache, nausea or vomiting.  Patient states she slid with her buttocks down to the ground and felt pain in her thoracolumbar spine and right ribs.  She has a history of osteopenia and several months ago was found to have L1 compression fracture.  This was doing well responding well with physical therapy and conservative treatment.  Patient states after her fall backwards today she took 650 mg of Tylenol and has seen some improvement in the pain.  She denies any numbness tingling or radicular symptoms.  Patient denies any other injury to her body.  Patient without chest pain or shortness of breath. She has also had several urinary tract infections over the last couple of months, daughter states UTIs are diagnosed based on patient developing severe confusion.  Patient is incontinent.  Son was concerned that the patient showed some signs of confusion earlier today, this morning.  Son states patient got her days mixed up and thought she was going out to dinner with her son today when actually it is tomorrow.  Patient currently denies any pelvic pain, pressure or burning.Marland Kitchen  HPI  Past Medical History:  Diagnosis Date  . Allergic rhinitis 11/18/2013  . Asthma with COPD (chronic obstructive pulmonary disease) ([place]) 11/18/2013   Last Assessment & Plan:  No flair ups noted recently and breathing is essentially at baseline.    . Asthma without status asthmaticus    unspecified  . Borderline hypothyroidism 11/18/2013   Last Assessment & Plan:  Energy and tsh are doing well  . Cancer [facility])    colon cancer- surgically removed in 2006  . Cholelithiases   . Chronic kidney disease   . Colon cancer ([place])   . Complete left bundle branch block (LBBB) 11/18/2013  . Constipation 11/18/2013  . Diverticulosis   . Hiatal hernia    right  . HLD (hyperlipidemia) 11/18/2013   Last Assessment & Plan:  Appropriate diet is followed and no myalgia's are noted.    . Hyperlipidemia, unspecified   . Hypertension   . Hypertensive kidney disease with CKD stage III ([place]) 11/18/2013   Last Assessment & Plan:  Is compliant with hypertensive medications without clear side effects or lack of control.  Nausea and itching are not symptomatic and nsaids are being avoided.    . Melanoma ([place])   . Osteopenia     Patient Active Problem List   Diagnosis Date Noted  . Reflux esophagitis   . Anemia 05/30/2017  . Generalized weakness 05/20/2017  . Lower GI bleed 09/07/2016  . GI bleed 09/05/2016  . Allergic rhinitis 11/18/2013  . Asthma with COPD (chronic obstructive pulmonary disease) ([place]) 11/18/2013  . Borderline hypothyroidism 11/18/2013  . Complete left bundle branch block (LBBB) 11/18/2013  . Constipation 11/18/2013  . Hypertensive kidney disease with CKD stage III ([place]) 11/18/2013    Past Surgical History:  Procedure Laterality Date  . ABDOMINAL HYSTERECTOMY    .  Bladder Lift    . BREAST EXCISIONAL BIOPSY Left 15+ yrs ago   neg  . COLON RESECTION  2006   colon cancer  . COLON SURGERY    . COLON SURGERY Right   . COLONOSCOPY WITH PROPOFOL N/A 06/01/2017   Procedure: COLONOSCOPY WITH PROPOFOL;  Surgeon: [name], MD;  Location: [facility] ENDOSCOPY;  Service: Endoscopy;  Laterality: N/A;  . ESOPHAGOGASTRODUODENOSCOPY (EGD) WITH PROPOFOL N/A 06/01/2017   Procedure: ESOPHAGOGASTRODUODENOSCOPY (EGD) WITH PROPOFOL;  Surgeon: [name], MD;  Location: [facility] ENDOSCOPY;   Service: Endoscopy;  Laterality: N/A;  . HEMICOLECTOMY Right   . SKIN CANCER EXCISION  2006   melanoma     OB History   None      Home Medications    Prior to Admission medications   Medication Sig Start Date End Date Taking? Authorizing Provider  Amino Acids-Protein Hydrolys (FEEDING SUPPLEMENT, PRO-STAT SUGAR FREE 64,) LIQD Take 30 mLs by mouth 2 (two) times daily between meals. low protein, low albumin    [provider]  bisacodyl (DULCOLAX) 10 MG suppository Place 10 mg rectally 2 (two) times daily as needed.    [provider]  calcium carbonate (TUMS EX) 750 MG chewable tablet Chew 2 tablets by mouth daily.     [provider]  Cholecalciferol (VITAMIN D-1000 MAX ST) 1000 units tablet Take 2,000 Units by mouth daily.  12/10/15   [provider]  cyanocobalamin 1000 MCG tablet Take 2,000 mcg by mouth daily.     [provider]  docusate sodium (COLACE) 100 MG capsule Take 1 capsule (100 mg total) by mouth daily as needed. 04/14/17 04/14/18  Schuyler Amor, MD  ferrous sulfate 325 (65 FE) MG tablet Take 325 mg by mouth 2 (two) times daily.    [provider]  fluticasone (FLONASE) 50 MCG/ACT nasal spray USE 2 SPRAYS IN EACH       NOSTRIL DAILY 11/19/15   [provider]  lactulose (CHRONULAC) 10 GM/15ML solution Take 30 g by mouth 2 (two) times daily as needed.    [provider]  losartan (COZAAR) 25 MG tablet Take 25 mg by mouth daily. Take 1 tablet PRN if BP is >150/90    [provider]  Nutritional Supplements (ENSURE ENLIVE PO) Take 1 Bottle by mouth 2 (two) times daily between meals.    [provider]  pantoprazole (PROTONIX) 40 MG tablet Take 40 mg by mouth 2 (two) times daily. before a meal to help with esophagus problems    [provider]  polyethylene glycol (MIRALAX / GLYCOLAX) packet Take 17 g by mouth daily.    [provider]  Probiotic Product (RISA-BID  PROBIOTIC) TABS Take 1 tablet by mouth 2 (two) times daily.    [provider]  sulfamethoxazole-trimethoprim (BACTRIM DS,SEPTRA DS) 800-160 MG tablet Take 1 tablet by mouth 2 (two) times daily. 07/13/17   Duanne Guess, PA-C    Family History Family History  Problem Relation Age of Onset  . Breast cancer Daughter 48       passed at 76 from breast cancer  . Heart disease Mother   . Heart failure Mother   . Lung cancer Father     Social History Social History   Tobacco Use  . Smoking status: Never Smoker  . Smokeless tobacco: Never Used  Substance Use Topics  . Alcohol use: No  . Drug use: Never     Allergies   Patient has no known allergies.  Review of Systems Review of Systems  Constitutional: Negative for activity change, chills and fever.  HENT: Negative for rhinorrhea, sinus pressure, sinus pain, trouble swallowing and voice change.   Respiratory: Positive for cough.   Gastrointestinal: Negative for abdominal pain, diarrhea, nausea and vomiting.  Genitourinary: Positive for frequency. Negative for difficulty urinating.  Musculoskeletal: Positive for back pain. Negative for joint swelling and neck stiffness.  Neurological: Negative for dizziness, syncope, light-headedness and headaches.  Psychiatric/Behavioral: Negative for confusion.     Physical Exam Updated Vital Signs BP 133/76 (BP Location: Left Arm)   Pulse 96   Temp 97.9 F (36.6 C) (Oral)   Resp 18   Ht 5' (1.524 m)   Wt 63.5 kg (140 lb)   SpO2 97%   BMI 27.34 kg/m   Physical Exam  Constitutional: She is oriented to person, place, and time. She appears well-developed and well-nourished.  Patient appears comfortable, no apparent distress.  She shows no signs of confusion.  HENT:  Head: Normocephalic and atraumatic.  Right Ear: External ear normal.  Left Ear: External ear normal.  Eyes: Pupils are equal, round, and reactive to light. Conjunctivae and EOM are normal.  Neck: Normal  range of motion.  Cardiovascular: Normal rate.  Pulmonary/Chest: Effort normal and breath sounds normal. No respiratory distress. She has no wheezes. She exhibits no tenderness.  Abdominal: Soft. She exhibits no distension. There is no tenderness.  Musculoskeletal: Normal range of motion.  Patient tender along the thoracolumbar junction along the spinous process with mild right rib discomfort along the mid lateral ribs mid axillary line.  No step-off.  No bruising ecchymosis or skin breakdown noted.  Patient with no cervical spinous process tenderness.  She has no tenderness throughout the shoulders, hips knees or ankles.  She has good range of motion of both hips with no discomfort.  Neurological: She is alert and oriented to person, place, and time. No cranial nerve deficit. Coordination normal.  Skin: Skin is warm. No rash noted. No erythema.  Psychiatric: She has a normal mood and affect. Her behavior is normal. Judgment and thought content normal.     ED Treatments / Results  Labs (all labs ordered are listed, but only abnormal results are displayed) Labs Reviewed  URINALYSIS, COMPLETE (UACMP) WITH MICROSCOPIC - Abnormal; Notable for the following components:      Result Value   Color, Urine YELLOW (*)    APPearance HAZY (*)    Leukocytes, UA MODERATE (*)    WBC, UA >50 (*)    Bacteria, UA MANY (*)    All other components within normal limits  URINE CULTURE    EKG None  Radiology Dg Ribs Unilateral W/chest Right  Result Date: 07/13/2017 CLINICAL DATA:  [place] tonight.  RIGHT back pain. EXAM: RIGHT RIBS AND CHEST - 3+ VIEW COMPARISON:  Chest radiograph April 14, 2017 FINDINGS: Cardiac silhouette is moderately enlarged and unchanged. Calcified aortic knob. No pleural effusion or focal consolidation. Biapical pleural thickening. No pneumothorax. Old RIGHT sixth and eighth rib fractures. IMPRESSION: Stable cardiomegaly.  No acute pulmonary process. No acute rib fracture deformity.   Old RIGHT rib fractures. Aortic Atherosclerosis (ICD10-I70.0). Electronically Signed   By: Elon Alas M.D.   On: 07/13/2017 22:21   Dg Thoracic Spine 2 View  Result Date: 07/13/2017 CLINICAL DATA:  Fall with back pain EXAM: THORACIC SPINE 2 VIEWS COMPARISON:  Chest x-ray 05/30/2017 FINDINGS: Mild scoliosis of the spine. Thoracic alignment otherwise within normal limits. Imaged vertebral bodies are  maintained. Mild degenerative osteophytes. IMPRESSION: Mild scoliosis. Otherwise no acute osseous abnormality. See separately dictated lumbar spine report Electronically Signed   By: Donavan Foil M.D.   On: 07/13/2017 22:23   Dg Lumbar Spine Complete  Result Date: 07/13/2017 CLINICAL DATA:  Fall with back pain EXAM: LUMBAR SPINE - COMPLETE 4+ VIEW COMPARISON:  CT 04/14/2017 FINDINGS: Lumbar numbering similar to abdominal CT 04/14/2017. 9 mm anterolisthesis of L5 on S1. Moderate compression fracture of L1 with interval increase in loss of height of the vertebral body, about 40% anteriorly. Suspected retropulsion of posterior vertebral body about its 6 mm. Remainder of the vertebral body heights are maintained. Mild degenerative changes at L1-L2. Vascular calcification IMPRESSION: Moderate compression fracture at L1, patient was noted to have acute to subacute superior endplate deformity at this level on prior abdominal CT from February 2019. There is been interval increase in loss of the vertebral body height, close to 40% with increased retropulsion of posterior vertebral body. Electronically Signed   By: Donavan Foil M.D.   On: 07/13/2017 22:22    Procedures Procedures (including critical care time)  Medications Ordered in ED Medications  sulfamethoxazole-trimethoprim (BACTRIM DS,SEPTRA DS) 800-160 MG per tablet 1 tablet (has no administration in time range)     Initial Impression / Assessment and Plan / ED Course  I have reviewed the triage vital signs and the nursing notes.  Pertinent  labs & imaging results that were available during my care of the patient were reviewed by me and considered in my medical decision making (see chart for details).     84 year old female presents with acute lower back pain after she fell against the wall and slid onto her buttocks.  She had pain on the lower back and x-rays ordered and reviewed by me today show interval increase in L1 compression deformity, approximately 40%.  Overall her pain is well controlled with 650 mg of Tylenol that was taken earlier today.  She appears to be very comfortable and tolerates movement well.  Would recommend she follow back up with neurosurgery who was evaluated her for the L1 compression fracture that initially occurred back in February and was only a very mild endplate deformity.  Urinalysis ordered and reviewed by me today shows patient does have concern for urinary tract infection.  Will place patient back on Bactrim.  In and out cath was performed and culture was obtained so should be a cleaner specimen then last culture.  Offered patient intramuscular injection of ceftriaxone today but patient refused.    Final Clinical Impressions(s) / ED Diagnoses   Final diagnoses:  Acute right-sided low back pain without sciatica  Closed compression fracture of body of L1 vertebra (HCC)  Acute cystitis without hematuria    ED Discharge Orders        Ordered    sulfamethoxazole-trimethoprim (BACTRIM DS,SEPTRA DS) 800-160 MG tablet  2 times daily     07/13/17 2344       Renata Caprice 07/13/17 2352    Eula Listen, MD 07/21/17 [phone]

## 2017-07-13 NOTE — Progress Notes (Signed)
Primary Care Physician: Kirk Ruths, MD  Primary Gastroenterologist:  Dr. [name]  No chief complaint on file.   HPI: Beth Chen is a 84 y.o. female here for follow-up after a recent hospital stay due to a GI bleed.  The patient underwent an EGD and colonoscopy and was found to have diverticulosis and a right hemicolectomy.  The patient had a normal upper endoscopy without any sign of any old blood or new blood.  The patient's colon was covered with old clotted blood.  There is no active bleeding seen at that time.  Current Outpatient Medications  Medication Sig Dispense Refill  . Amino Acids-Protein Hydrolys (FEEDING SUPPLEMENT, PRO-STAT SUGAR FREE 64,) LIQD Take 30 mLs by mouth 2 (two) times daily between meals. low protein, low albumin    . bisacodyl (DULCOLAX) 10 MG suppository Place 10 mg rectally 2 (two) times daily as needed.    . calcium carbonate (TUMS EX) 750 MG chewable tablet Chew 2 tablets by mouth daily.     . Cholecalciferol (VITAMIN D-1000 MAX ST) 1000 units tablet Take 2,000 Units by mouth daily.     . cyanocobalamin 1000 MCG tablet Take 2,000 mcg by mouth daily.     Marland Kitchen docusate sodium (COLACE) 100 MG capsule Take 1 capsule (100 mg total) by mouth daily as needed. 30 capsule 0  . ferrous sulfate 325 (65 FE) MG tablet Take 325 mg by mouth 2 (two) times daily.    . fluticasone (FLONASE) 50 MCG/ACT nasal spray USE 2 SPRAYS IN EACH       NOSTRIL DAILY    . lactulose (CHRONULAC) 10 GM/15ML solution Take 30 g by mouth 2 (two) times daily as needed.    Marland Kitchen losartan (COZAAR) 25 MG tablet Take 25 mg by mouth daily. Take 1 tablet PRN if BP is >150/90    . Nutritional Supplements (ENSURE ENLIVE PO) Take 1 Bottle by mouth 2 (two) times daily between meals.    . pantoprazole (PROTONIX) 40 MG tablet Take 40 mg by mouth 2 (two) times daily. before a meal to help with esophagus problems    . polyethylene glycol (MIRALAX / GLYCOLAX) packet Take 17 g by mouth daily.    .  Probiotic Product (RISA-BID PROBIOTIC) TABS Take 1 tablet by mouth 2 (two) times daily.     No current facility-administered medications for this visit.     Allergies as of 07/14/2017  . (No Known Allergies)    ROS:  General: Negative for anorexia, weight loss, fever, chills, fatigue, weakness. ENT: Negative for hoarseness, difficulty swallowing , nasal congestion. CV: Negative for chest pain, angina, palpitations, dyspnea on exertion, peripheral edema.  Respiratory: Negative for dyspnea at rest, dyspnea on exertion, cough, sputum, wheezing.  GI: See history of present illness. GU:  Negative for dysuria, hematuria, urinary incontinence, urinary frequency, nocturnal urination.  Endo: Negative for unusual weight change.    Physical Examination:   There were no vitals taken for this visit.  General: Well-nourished, well-developed in no acute distress.  Eyes: No icterus. Conjunctivae pink. Extremities: No lower extremity edema. No clubbing or deformities. Neuro: Alert and oriented x 3.  Grossly intact. Skin: Warm and dry, no jaundice.   Psych: Alert and cooperative, normal mood and affect.  Labs:    Imaging Studies: No results found.  Assessment and Plan:   Beth Chen is a 84 y.o. y/o female fho comes in for follow-up after having a hospitalization for the GI bleed.  The patient's  hemoglobin has remained stable and she has had no further sign of any GI bleeding.  The patient will follow up as needed and will contact me if she has any further problems.    [name], MD. [name]   Note: This dictation was prepared with Dragon dictation along with smaller phrase technology. Any transcriptional errors that result from this process are unintentional.

## 2017-07-13 NOTE — ED Notes (Signed)
Pt states she fell backwards after getting up from the potty. Pt states her left lower back hurts. Pt states no LOC.

## 2017-07-14 ENCOUNTER — Encounter: Payer: Self-pay | Admitting: Gastroenterology

## 2017-07-14 ENCOUNTER — Encounter (INDEPENDENT_AMBULATORY_CARE_PROVIDER_SITE_OTHER): Payer: Self-pay

## 2017-07-14 ENCOUNTER — Ambulatory Visit: Payer: Medicare Other | Admitting: Gastroenterology

## 2017-07-14 VITALS — BP 148/69 | HR 75 | Ht 60.0 in | Wt 143.0 lb

## 2017-07-14 DIAGNOSIS — K922 Gastrointestinal hemorrhage, unspecified: Secondary | ICD-10-CM | POA: Diagnosis not present

## 2017-07-15 ENCOUNTER — Telehealth: Payer: Self-pay | Admitting: Gastroenterology

## 2017-07-15 NOTE — Telephone Encounter (Signed)
Beth Chen LVM from the [facility] of [employer]. Patient told her Dr. Allen Norris was going to refer her to [facility]. They will need a referral for that.  Questions? [phone]

## 2017-07-16 LAB — URINE CULTURE

## 2017-07-16 NOTE — Telephone Encounter (Signed)
Contacted Bobbie back to inform her pt will need to get the referral from her PCP.

## 2017-07-17 NOTE — Progress Notes (Signed)
Rx given for bactrim for UTI- Cx growing ecoli R to bactrim. Spoke w/ Dr. Archie Balboa- decision was made to change to keflex. Spoke to pt daughter- pt is at [facility] @ [place] [address]- states pt is declining. Called [facility] and spoke w/ RN who gave me the phone # to pharmacy to call in RX. [facility] @ [phone]. Called in Rx for keflex 500mg  BID x 7 days. Stop bactrim- authorized by dr Archie Balboa.   Ramond Dial, Pharm.D, BCPS Clinical Pharmacist

## 2017-07-29 ENCOUNTER — Other Ambulatory Visit
Admission: RE | Admit: 2017-07-29 | Discharge: 2017-07-29 | Disposition: A | Discharge status: Home / Self Care | Payer: Medicare Other | Source: Ambulatory Visit | Attending: Internal Medicine | Admitting: Internal Medicine

## 2017-07-29 DIAGNOSIS — K922 Gastrointestinal hemorrhage, unspecified: Secondary | ICD-10-CM | POA: Insufficient documentation

## 2017-07-29 LAB — CBC WITH DIFFERENTIAL/PLATELET
BASOS ABS: 0.1 10*3/uL (ref 0–0.1)
Basophils Relative: 1 %
EOS ABS: 0.6 10*3/uL (ref 0–0.7)
Eosinophils Relative: 5 %
HCT: 35.8 % (ref 35.0–47.0)
Hemoglobin: 11.7 g/dL — ABNORMAL LOW (ref 12.0–16.0)
LYMPHS PCT: 9 %
Lymphs Abs: 1.1 10*3/uL (ref 1.0–3.6)
MCH: 27.7 pg (ref 26.0–34.0)
MCHC: 32.7 g/dL (ref 32.0–36.0)
MCV: 84.8 fL (ref 80.0–100.0)
MONO ABS: 0.9 10*3/uL (ref 0.2–0.9)
Monocytes Relative: 7 %
NEUTROS PCT: 78 %
Neutro Abs: 9.9 10*3/uL — ABNORMAL HIGH (ref 1.4–6.5)
PLATELETS: 465 10*3/uL — AB (ref 150–440)
RBC: 4.22 MIL/uL (ref 3.80–5.20)
RDW: 17.8 % — ABNORMAL HIGH (ref 11.5–14.5)
WBC: 12.6 10*3/uL — AB (ref 3.6–11.0)

## 2017-07-31 ENCOUNTER — Encounter
Admission: RE | Admit: 2017-07-31 | Discharge: 2017-07-31 | Disposition: A | Discharge status: Home / Self Care | Payer: Medicare Other | Source: Ambulatory Visit | Attending: Internal Medicine | Admitting: Internal Medicine

## 2017-08-09 ENCOUNTER — Ambulatory Visit: Payer: Medicare Other | Admitting: Urology

## 2017-08-09 ENCOUNTER — Encounter: Payer: Self-pay | Admitting: Urology

## 2017-08-09 VITALS — BP 161/83 | HR 72

## 2017-08-09 DIAGNOSIS — N390 Urinary tract infection, site not specified: Secondary | ICD-10-CM | POA: Diagnosis not present

## 2017-08-09 LAB — MICROSCOPIC EXAMINATION
BACTERIA UA: NONE SEEN
Epithelial Cells (non renal): NONE SEEN /hpf (ref 0–10)
RBC, UA: NONE SEEN /hpf (ref 0–2)
WBC, UA: NONE SEEN /hpf (ref 0–5)

## 2017-08-09 LAB — URINALYSIS, COMPLETE
BILIRUBIN UA: NEGATIVE
Glucose, UA: NEGATIVE
Ketones, UA: NEGATIVE
LEUKOCYTES UA: NEGATIVE
Nitrite, UA: NEGATIVE
PROTEIN UA: NEGATIVE
RBC UA: NEGATIVE
SPEC GRAV UA: 1.015 (ref 1.005–1.030)
Urobilinogen, Ur: 0.2 mg/dL (ref 0.2–1.0)
pH, UA: 7 (ref 5.0–7.5)

## 2017-08-09 NOTE — Progress Notes (Signed)
08/09/2017 3:38 PM   Beth Chen 1921-07-08 [id number]  Referring provider: Kirk Ruths, MD [facility] [place], [place] [address]  Chief Complaint  Patient presents with  . Recurrent UTI    HPI: I was consulted to assess the patient's probable urinary tract infection. She was voiding small amounts with some difficulty urinating. It appears she was called by her primary care physician and treated as a urinary tract infection. The symptoms normalized.  Her flow was reasonable. She voids every few hours. She gets up twice at night. She had a bladder or prolapse operation years ago. She does not get a lot of bladder infections  I history the patient had a bladder infection and otherwise has minimal voiding dysfunction a mild frequency. I will send her urine for culture today and call if it is positive. I did find in her primary care notes that she did have a positive culture. I will see her when necessary  Today Details of the history were obtained from the patient and her daughter.  The patient does report at times she gets cystitis symptoms but other times it appears she will then get CNS symptoms.  She has been the emergency room a few times.  She has not been admitted but I think once she was treated in a rehab type setting.  She does not think she is infected now.  I reviewed her medical records and in the last 3 months she has had at least 2+ cultures and one negative.  She has had one negative blood culture.  She had normal kidneys on a CT stone protocol April 14, 2017  Frequency stable  Modifying factors: There are no other modifying factors  Associated signs and symptoms: There are no other associated signs and symptoms Aggravating and relieving factors: There are no other aggravating or relieving factors Severity: Moderate Duration: Persistent     PMH: Past Medical History:  Diagnosis Date  . Allergic rhinitis 11/18/2013  .  Asthma with COPD (chronic obstructive pulmonary disease) ([facility]) 11/18/2013   Last Assessment & Plan:  No flair ups noted recently and breathing is essentially at baseline.    . Asthma without status asthmaticus    unspecified  . Borderline hypothyroidism 11/18/2013   Last Assessment & Plan:  Energy and tsh are doing well  . Cancer [facility])    colon cancer- surgically removed in 2006  . Cholelithiases   . Chronic kidney disease   . Colon cancer ([place])   . Complete left bundle branch block (LBBB) 11/18/2013  . Constipation 11/18/2013  . Diverticulosis   . Hiatal hernia    right  . HLD (hyperlipidemia) 11/18/2013   Last Assessment & Plan:  Appropriate diet is followed and no myalgia's are noted.    . Hyperlipidemia, unspecified   . Hypertension   . Hypertensive kidney disease with CKD stage III ([place]) 11/18/2013   Last Assessment & Plan:  Is compliant with hypertensive medications without clear side effects or lack of control.  Nausea and itching are not symptomatic and nsaids are being avoided.    . Melanoma ([place])   . Osteopenia     Surgical History: Past Surgical History:  Procedure Laterality Date  . ABDOMINAL HYSTERECTOMY    . Bladder Lift    . BREAST EXCISIONAL BIOPSY Left 15+ yrs ago   neg  . COLON RESECTION  2006   colon cancer  . COLON SURGERY    . COLON SURGERY  Right   . COLONOSCOPY WITH PROPOFOL N/A 06/01/2017   Procedure: COLONOSCOPY WITH PROPOFOL;  Surgeon: [name], MD;  Location: [facility] ENDOSCOPY;  Service: Endoscopy;  Laterality: N/A;  . ESOPHAGOGASTRODUODENOSCOPY (EGD) WITH PROPOFOL N/A 06/01/2017   Procedure: ESOPHAGOGASTRODUODENOSCOPY (EGD) WITH PROPOFOL;  Surgeon: [name], MD;  Location: [facility] ENDOSCOPY;  Service: Endoscopy;  Laterality: N/A;  . HEMICOLECTOMY Right   . SKIN CANCER EXCISION  2006   melanoma    Home Medications:  Allergies as of 08/09/2017   No Known Allergies     Medication List        Accurate as of 08/09/17  3:38 PM. Always use your most  recent med list.          acetaminophen 325 MG tablet Commonly known as:  TYLENOL Take 650 mg by mouth every 4 (four) hours as needed.   bisacodyl 10 MG suppository Commonly known as:  DULCOLAX Place 10 mg rectally 2 (two) times daily as needed.   calcium carbonate 750 MG chewable tablet Commonly known as:  TUMS EX Chew 2 tablets by mouth daily.   CRANBERRY CONCENTRATE 500 MG Caps Generic drug:  Cranberry Take 1 capsule by mouth daily.   cyanocobalamin 1000 MCG tablet Take 2,000 mcg by mouth daily.   diclofenac sodium 1 % Gel Commonly known as:  VOLTAREN Apply 4 g topically 4 (four) times daily.   docusate sodium 100 MG capsule Commonly known as:  COLACE Take 1 capsule (100 mg total) by mouth daily as needed.   ENSURE ENLIVE PO Take 1 Bottle by mouth 2 (two) times daily between meals.   feeding supplement (PRO-STAT SUGAR FREE 64) Liqd Take 30 mLs by mouth 2 (two) times daily between meals. low protein, low albumin   ferrous sulfate 325 (65 FE) MG tablet Take 325 mg by mouth 2 (two) times daily.   fluticasone 50 MCG/ACT nasal spray Commonly known as:  FLONASE USE 2 SPRAYS IN EACH       NOSTRIL DAILY   fluticasone furoate-vilanterol 100-25 MCG/INH Aepb Commonly known as:  BREO ELLIPTA Inhale 1 puff into the lungs daily.   ipratropium-albuterol 0.5-2.5 (3) MG/3ML Soln Commonly known as:  DUONEB Take 3 mLs by nebulization every 4 (four) hours as needed.   lactulose 10 GM/15ML solution Commonly known as:  CHRONULAC Take 30 g by mouth 2 (two) times daily as needed.   losartan 25 MG tablet Commonly known as:  COZAAR Take 25 mg by mouth daily. Take 1 tablet PRN if BP is >150/90   nystatin-triamcinolone cream Commonly known as:  MYCOLOG II Apply 1 application topically 2 (two) times daily.   ondansetron 4 MG tablet Commonly known as:  ZOFRAN Take 4 mg by mouth every 6 (six) hours as needed for nausea or vomiting.   pantoprazole 40 MG tablet Commonly known  as:  PROTONIX Take 40 mg by mouth 2 (two) times daily. before a meal to help with esophagus problems   polyethylene glycol packet Commonly known as:  MIRALAX / GLYCOLAX Take 17 g by mouth daily.   RISA-BID PROBIOTIC Tabs Take 1 tablet by mouth 2 (two) times daily.   sulfamethoxazole-trimethoprim 800-160 MG tablet Commonly known as:  BACTRIM DS,SEPTRA DS Take 1 tablet by mouth 2 (two) times daily.   vitamin C 500 MG tablet Commonly known as:  ASCORBIC ACID Take 500 mg by mouth 2 (two) times daily.   VITAMIN D-1000 MAX ST 1000 units tablet Generic drug:  Cholecalciferol Take 2,000 Units by mouth  daily.       Allergies: No Known Allergies  Family History: Family History  Problem Relation Age of Onset  . Breast cancer Daughter 110       passed at 1 from breast cancer  . Heart disease Mother   . Heart failure Mother   . Lung cancer Father     Social History:  reports that she has never smoked. She has never used smokeless tobacco. She reports that she does not drink alcohol or use drugs.  ROS: UROLOGY Frequent Urination?: No Hard to postpone urination?: No Burning/pain with urination?: No Get up at night to urinate?: No Leakage of urine?: No Urine stream starts and stops?: No Trouble starting stream?: No Do you have to strain to urinate?: No Blood in urine?: No Urinary tract infection?: Yes Sexually transmitted disease?: No Injury to kidneys or bladder?: No Painful intercourse?: No Weak stream?: No Currently pregnant?: No Vaginal bleeding?: No Last menstrual period?: n  Gastrointestinal Nausea?: No Vomiting?: No Indigestion/heartburn?: No Diarrhea?: No Constipation?: No  Constitutional Fever: No Night sweats?: No Weight loss?: No Fatigue?: No  Skin Skin rash/lesions?: No Itching?: No  Eyes Blurred vision?: No Double vision?: No  Ears/Nose/Throat Sore throat?: No Sinus problems?: No  Hematologic/Lymphatic Swollen glands?: No Easy  bruising?: No  Cardiovascular Leg swelling?: No Chest pain?: No  Respiratory Cough?: No Shortness of breath?: No  Endocrine Excessive thirst?: No  Musculoskeletal Back pain?: No Joint pain?: No  Neurological Headaches?: No Dizziness?: No  Psychologic Depression?: No Anxiety?: No  Physical Exam: BP (!) 161/83   Pulse 72   Constitutional:  Alert and oriented, No acute distress.   Laboratory Data: Lab Results  Component Value Date   WBC 12.6 (H) 07/29/2017   HGB 11.7 (L) 07/29/2017   HCT 35.8 07/29/2017   MCV 84.8 07/29/2017   PLT 465 (H) 07/29/2017    Lab Results  Component Value Date   CREATININE 0.82 06/27/2017    No results found for: PSA  No results found for: TESTOSTERONE  No results found for: HGBA1C  Urinalysis    Component Value Date/Time   COLORURINE YELLOW (A) 07/13/2017 2241   APPEARANCEUR HAZY (A) 07/13/2017 2241   APPEARANCEUR Cloudy (A) 04/21/2016 1010   LABSPEC 1.021 07/13/2017 2241   LABSPEC 1.021 09/19/2011 1607   PHURINE 5.0 07/13/2017 [place] 07/13/2017 2241   GLUCOSEU Negative 09/19/2011 [place] 07/13/2017 [place] 07/13/2017 2241   BILIRUBINUR Negative 04/21/2016 1010   BILIRUBINUR Negative 09/19/2011 1607   [place] 07/13/2017 [place] 07/13/2017 2241   NITRITE NEGATIVE 07/13/2017 2241   LEUKOCYTESUR MODERATE (A) 07/13/2017 2241   LEUKOCYTESUR 2+ (A) 04/21/2016 1010   LEUKOCYTESUR Trace 09/19/2011 1607    Pertinent Imaging: Noted  Assessment & Plan: The role of prophylaxis with triprim discussed.  The role of the catheter urine to ensure sterility today discussed.  I did mention that patients can be chronically colonized but we need to assume that the positive cultures have been related to some of her CNS symptoms.cath urine c/s sent 1. Recurrent UTI   - Urinalysis, Complete   No follow-ups on file.  Reece Packer,  MD  [facility] [address] [place], [place] [address] [phone]

## 2017-08-09 NOTE — Progress Notes (Signed)
In and Out Catheterization  Patient is present today for a I & O catheterization due to UTI. Patient was cleaned and prepped in a sterile fashion with betadine and Lidocaine 2% jelly was instilled into the urethra.  A 14FR cath was inserted no complications were noted , 267ml of urine return was noted, urine was yellow in color. A clean urine sample was collected for UA and culture. Bladder was drained  And catheter was removed with out difficulty.    Preformed by: Fonnie Jarvis, CMA

## 2017-08-11 LAB — CULTURE, URINE COMPREHENSIVE

## 2017-08-13 ENCOUNTER — Encounter: Payer: Self-pay | Admitting: Gerontology

## 2017-08-13 ENCOUNTER — Non-Acute Institutional Stay (SKILLED_NURSING_FACILITY): Payer: Medicare Other | Admitting: Gerontology

## 2017-08-13 DIAGNOSIS — I129 Hypertensive chronic kidney disease with stage 1 through stage 4 chronic kidney disease, or unspecified chronic kidney disease: Secondary | ICD-10-CM

## 2017-08-13 DIAGNOSIS — N183 Chronic kidney disease, stage 3 unspecified: Secondary | ICD-10-CM

## 2017-08-13 DIAGNOSIS — I447 Left bundle-branch block, unspecified: Secondary | ICD-10-CM | POA: Diagnosis not present

## 2017-08-13 DIAGNOSIS — E46 Unspecified protein-calorie malnutrition: Secondary | ICD-10-CM | POA: Diagnosis not present

## 2017-08-13 NOTE — Progress Notes (Signed)
Location:   [place] Room Number: [phone] Place of Service:  SNF [phone]) Provider:  Toni Arthurs, NP-C  Kirk Ruths, MD  Patient Care Team: Kirk Ruths, MD as PCP - General (Internal Medicine)  Extended Emergency Contact Information Primary Emergency Contact: Dondra Spry [place] Phone: [phone] Mobile Phone: [phone] Relation: Daughter Secondary Emergency Contact: Costella Hatcher [place] Phone: [phone] Relation: Son  Code Status:  DNR Goals of care: Advanced Directive information Advanced Directives 08/13/2017  Does Patient Have a Medical Advance Directive? Yes  Type of Advance Directive Out of facility DNR (pink MOST or yellow form);Healthcare Power of Attorney  Does patient want to make changes to medical advance directive? No - Patient declined  Copy of [place] in Chart? Yes  Would patient like information on creating a medical advance directive? -  Pre-existing out of facility DNR order (yellow form or pink MOST form) Yellow form placed in chart (order not valid for inpatient use)     Chief Complaint  Patient presents with  . Medical Management of Chronic Issues    Routine Visit    HPI:  Pt is a 84 y.o. female seen today for medical management of chronic diseases.    Complete left bundle branch block (LBBB) Stable.  Does complain of persistent fatigue.  Denies chest pain or shortness of breath  Hypertensive kidney disease with CKD stage III (HCC) Stable.  No recent episodes of hyper or hypotension.  Symptoms controlled with losartan 25 mg p.o. daily.  Medications renally adjusted as appropriate  Protein-calorie malnutrition, unspecified severity ([place]) Stable.  Patient is on Protostat 30 mL p.o. twice daily and Ensure Enlive 1 bottle p.o. twice daily for nutritional support.  Most recent protein and albumin with minimal change from previous  results.  Likely, patient is refusing supplements at times.   Past Medical History:  Diagnosis Date  . Allergic rhinitis 11/18/2013  . Asthma with COPD (chronic obstructive pulmonary disease) ([place]) 11/18/2013   Last Assessment & Plan:  No flair ups noted recently and breathing is essentially at baseline.    . Asthma without status asthmaticus    unspecified  . Borderline hypothyroidism 11/18/2013   Last Assessment & Plan:  Energy and tsh are doing well  . Cancer [facility])    colon cancer- surgically removed in 2006  . Cholelithiases   . Chronic kidney disease   . Colon cancer ([place])   . Complete left bundle branch block (LBBB) 11/18/2013  . Constipation 11/18/2013  . Diverticulosis   . Hiatal hernia    right  . HLD (hyperlipidemia) 11/18/2013   Last Assessment & Plan:  Appropriate diet is followed and no myalgia's are noted.    . Hyperlipidemia, unspecified   . Hypertension   . Hypertensive kidney disease with CKD stage III ([place]) 11/18/2013   Last Assessment & Plan:  Is compliant with hypertensive medications without clear side effects or lack of control.  Nausea and itching are not symptomatic and nsaids are being avoided.    . Melanoma ([place])   . Osteopenia    Past Surgical History:  Procedure Laterality Date  . ABDOMINAL HYSTERECTOMY    . Bladder Lift    . BREAST EXCISIONAL BIOPSY Left 15+ yrs ago   neg  . COLON RESECTION  2006   colon cancer  . COLON SURGERY    . COLON SURGERY Right   . COLONOSCOPY WITH PROPOFOL N/A 06/01/2017  Procedure: COLONOSCOPY WITH PROPOFOL;  Surgeon: [name], MD;  Location: [facility] ENDOSCOPY;  Service: Endoscopy;  Laterality: N/A;  . ESOPHAGOGASTRODUODENOSCOPY (EGD) WITH PROPOFOL N/A 06/01/2017   Procedure: ESOPHAGOGASTRODUODENOSCOPY (EGD) WITH PROPOFOL;  Surgeon: [name], MD;  Location: [facility] ENDOSCOPY;  Service: Endoscopy;  Laterality: N/A;  . HEMICOLECTOMY Right   . SKIN CANCER EXCISION  2006   melanoma    No Known Allergies  Allergies as of  08/13/2017   No Known Allergies     Medication List        Accurate as of 08/13/17  2:43 PM. Always use your most recent med list.          acetaminophen 325 MG tablet Commonly known as:  TYLENOL Take 650 mg by mouth every 4 (four) hours as needed. for pain/ increased temp. May be administered orally, per G-tube if needed or rectally if unable to swallow (separate order). Maximum dose for 24 hours is 3,000 mg from all sources of Acetaminophen/ Tylenol   bisacodyl 10 MG suppository Commonly known as:  DULCOLAX Place 10 mg rectally 2 (two) times daily as needed.   calcium carbonate 750 MG chewable tablet Commonly known as:  TUMS EX Chew 2 tablets by mouth daily.   Camphor-Menthol-Methyl Sal 06-09-28 % Crea Apply 1 application topically 3 (three) times daily as needed.   CRANBERRY CONCENTRATE 500 MG Caps Generic drug:  Cranberry Take 1 capsule by mouth daily.   cyanocobalamin 1000 MCG tablet Take 2,000 mcg by mouth daily.   diclofenac sodium 1 % Gel Commonly known as:  VOLTAREN Apply 4 g topically 4 (four) times daily. Apply 4 grams to the right knee QID for pain,   docusate sodium 100 MG capsule Commonly known as:  COLACE Take 1 capsule (100 mg total) by mouth daily as needed.   ENSURE ENLIVE PO Take 1 Bottle by mouth 2 (two) times daily between meals.   feeding supplement (PRO-STAT SUGAR FREE 64) Liqd Take 30 mLs by mouth 2 (two) times daily between meals. low protein, low albumin   ferrous sulfate 325 (65 FE) MG tablet Take 325 mg by mouth 2 (two) times daily.   fluticasone 50 MCG/ACT nasal spray Commonly known as:  FLONASE USE 2 SPRAYS IN EACH       NOSTRIL DAILY   fluticasone furoate-vilanterol 100-25 MCG/INH Aepb Commonly known as:  BREO ELLIPTA Inhale 1 puff into the lungs daily.   ipratropium-albuterol 0.5-2.5 (3) MG/3ML Soln Commonly known as:  DUONEB Take 3 mLs by nebulization 3 (three) times daily as needed.   lactulose 10 GM/15ML solution Commonly  known as:  CHRONULAC Take 30 g by mouth 2 (two) times daily as needed.   losartan 25 MG tablet Commonly known as:  COZAAR Take 25 mg by mouth daily. Take 1 tablet PRN if BP is >150/90   nystatin-triamcinolone cream Commonly known as:  MYCOLOG II Apply 1 application topically 2 (two) times daily. apply nystatin cream under the right breast to treat yeast infection/irritation   pantoprazole 40 MG tablet Commonly known as:  PROTONIX Take 40 mg by mouth daily. before a meal to help with esophagus problems   polyethylene glycol packet Commonly known as:  MIRALAX / GLYCOLAX Take 17 g by mouth daily.   RISA-BID PROBIOTIC Tabs Take 1 tablet by mouth 2 (two) times daily.   trimethoprim 100 MG tablet Commonly known as:  TRIMPEX Take 100 mg by mouth daily.   vitamin C 500 MG tablet Commonly known as:  ASCORBIC  ACID Take 500 mg by mouth 2 (two) times daily. Potentiate iron absorption (anemia   VITAMIN D-1000 MAX ST 1000 units tablet Generic drug:  Cholecalciferol Take 2,000 Units by mouth daily.       Review of Systems  Constitutional: Negative for activity change, appetite change, chills, diaphoresis and fever.  HENT: Negative for congestion, mouth sores, nosebleeds, postnasal drip, sneezing, sore throat, trouble swallowing and voice change.   Respiratory: Negative for apnea, cough, choking, chest tightness, shortness of breath and wheezing.   Cardiovascular: Negative for chest pain, palpitations and leg swelling.  Gastrointestinal: Negative for abdominal distention, abdominal pain, constipation, diarrhea and nausea.  Genitourinary: Negative for difficulty urinating, dysuria, frequency and urgency.  Musculoskeletal: Positive for arthralgias (typical arthritis) and gait problem. Negative for back pain and myalgias.  Skin: Negative for color change, pallor, rash and wound.  Neurological: Positive for weakness. Negative for dizziness, tremors, syncope, speech difficulty, numbness and  headaches.  Psychiatric/Behavioral: Negative for agitation and behavioral problems.  All other systems reviewed and are negative.   Immunization History  Administered Date(s) Administered  . PPD Test 09/09/2016  . Pneumococcal Conjugate-13 12/16/2016  . Pneumococcal-Unspecified 11/28/2008   Pertinent  Health Maintenance Due  Topic Date Due  . DEXA SCAN  11/28/1988  . INFLUENZA VACCINE  09/30/2017  . PNA vac Low Risk Adult  Completed   No flowsheet data found. Functional Status Survey:    Vitals:   08/13/17 1421  BP: (!) 167/84  Pulse: 70  Resp: 20  Temp: 98 F (36.7 C)  TempSrc: Oral  SpO2: 97%  Weight: 154 lb 12.8 oz (70.2 kg)  Height: 5' (1.524 m)   Body mass index is 30.23 kg/m. Physical Exam  Constitutional: She is oriented to person, place, and time. Vital signs are normal. She appears well-developed and well-nourished. She is active and cooperative. She does not appear ill. No distress.  HENT:  Head: Normocephalic and atraumatic.  Mouth/Throat: Uvula is midline, oropharynx is clear and moist and mucous membranes are normal. Mucous membranes are not pale, not dry and not cyanotic.  Eyes: Pupils are equal, round, and reactive to light. Conjunctivae, EOM and lids are normal.  Neck: Trachea normal, normal range of motion and full passive range of motion without pain. Neck supple. No JVD present. No tracheal deviation, no edema and no erythema present. No thyromegaly present.  Cardiovascular: Normal rate, regular rhythm, normal heart sounds, intact distal pulses and normal pulses. Exam reveals no gallop, no distant heart sounds and no friction rub.  No murmur heard. Pulses:      Dorsalis pedis pulses are 2+ on the right side, and 2+ on the left side.  No edema  Pulmonary/Chest: Effort normal and breath sounds normal. No accessory muscle usage. No respiratory distress. She has no decreased breath sounds. She has no wheezes. She has no rhonchi. She has no rales. She  exhibits no tenderness.  Abdominal: Soft. Normal appearance and bowel sounds are normal. She exhibits no distension and no ascites. There is no tenderness.  Musculoskeletal: Normal range of motion. She exhibits no edema or tenderness.  Expected osteoarthritis, stiffness; Bilateral Calves soft, supple. Negative Homan's Sign. B- pedal pulses equal; generalized weakness  Neurological: She is alert and oriented to person, place, and time. She has normal strength.  Skin: Skin is warm, dry and intact. She is not diaphoretic. No cyanosis. No pallor. Nails show no clubbing.  Psychiatric: She has a normal mood and affect. Her speech is normal and behavior  is normal. Judgment and thought content normal. Cognition and memory are impaired. She exhibits abnormal recent memory.  Nursing note and vitals reviewed.   Labs reviewed: Recent Labs    04/20/17 1505  06/07/17 0530 06/21/17 1542 06/27/17 1013  NA 133*   < > 137 133* 132*  K 3.6   < > 4.0 3.4* 3.4*  CL 93*   < > 102 98* 95*  CO2 29   < > 28 24 30   GLUCOSE 116*   < > 89 109* 89  BUN 39*   < > 18 21* 28*  CREATININE 0.93   < > 0.93 0.84 0.82  CALCIUM 10.0   < > 8.2* 8.2* 7.9*  MG 1.8  --   --   --   --    < > = values in this interval not displayed.   Recent Labs    06/07/17 0530 06/21/17 1542 06/27/17 1013  AST 18 24 19   ALT 13* 10* 11*  ALKPHOS 64 72 60  BILITOT 0.6 0.2* 0.7  PROT 4.8* 5.7* 5.2*  ALBUMIN 2.4* 3.0* 2.8*   Recent Labs    06/21/17 1542 06/29/17 0500 07/29/17 1531  WBC 7.3 8.9 12.6*  NEUTROABS 6.2 6.4 9.9*  HGB 9.5* 9.5* 11.7*  HCT 29.9* 29.0* 35.8  MCV 89.1 85.9 84.8  PLT 452* 395 465*   Lab Results  Component Value Date   TSH 3.269 04/20/2017   No results found for: HGBA1C Lab Results  Component Value Date   CHOL 127 04/20/2017   HDL 74 04/20/2017   LDLCALC 40 04/20/2017   TRIG 66 04/20/2017   CHOLHDL 1.7 04/20/2017    Significant Diagnostic Results in last 30 days:  No results  found.  Assessment/Plan  Complete left bundle branch block (LBBB)  Hypertensive kidney disease with CKD stage III ([place])  Protein-calorie malnutrition, unspecified severity ([place])   Above listed condition stable  Continue current medication regimen  Continue to encourage patient participation with activities and interaction with other residents  Allow for frequent rest breaks and naps as needed  Assist with ADLs as appropriate  Renally adjust medications as appropriate  Safety precautions  Fall precautions  Continue to be followed by palliative medicine  Family/ staff Communication:   Documentation:  Face to Face:  Family/Phone:   Labs/tests ordered: Not due  Medication list reviewed and assessed for continued appropriateness. Monthly medication orders reviewed and signed.  Vikki Ports, NP-C Geriatrics [facility] [phone] [place], [place] [address] Cell Phone (Mon-Fri 8am-5pm):  [phone] On Call:  [phone] & follow prompts after 5pm & weekends Office Phone:  [phone] Office Fax:  [phone]

## 2017-08-30 ENCOUNTER — Encounter
Admission: RE | Admit: 2017-08-30 | Discharge: 2017-08-30 | Disposition: A | Discharge status: Home / Self Care | Payer: Medicare Other | Source: Ambulatory Visit | Attending: Internal Medicine | Admitting: Internal Medicine

## 2017-08-30 DIAGNOSIS — E46 Unspecified protein-calorie malnutrition: Secondary | ICD-10-CM | POA: Insufficient documentation

## 2017-08-30 NOTE — Assessment & Plan Note (Signed)
Stable.  No recent episodes of hyper or hypotension.  Symptoms controlled with losartan 25 mg p.o. daily.  Medications renally adjusted as appropriate

## 2017-08-30 NOTE — Assessment & Plan Note (Signed)
Stable.  Does complain of persistent fatigue.  Denies chest pain or shortness of breath

## 2017-08-30 NOTE — Assessment & Plan Note (Signed)
Stable.  Patient is on Protostat 30 mL p.o. twice daily and Ensure Enlive 1 bottle p.o. twice daily for nutritional support.  Most recent protein and albumin with minimal change from previous results.  Likely, patient is refusing supplements at times.

## 2017-09-14 ENCOUNTER — Encounter: Payer: Self-pay | Admitting: Adult Health

## 2017-09-14 ENCOUNTER — Non-Acute Institutional Stay (SKILLED_NURSING_FACILITY): Payer: Medicare Other | Admitting: Adult Health

## 2017-09-14 DIAGNOSIS — R05 Cough: Secondary | ICD-10-CM | POA: Diagnosis not present

## 2017-09-14 DIAGNOSIS — I10 Essential (primary) hypertension: Secondary | ICD-10-CM | POA: Diagnosis not present

## 2017-09-14 DIAGNOSIS — D509 Iron deficiency anemia, unspecified: Secondary | ICD-10-CM

## 2017-09-14 DIAGNOSIS — N390 Urinary tract infection, site not specified: Secondary | ICD-10-CM

## 2017-09-14 DIAGNOSIS — R059 Cough, unspecified: Secondary | ICD-10-CM

## 2017-09-14 DIAGNOSIS — J449 Chronic obstructive pulmonary disease, unspecified: Secondary | ICD-10-CM | POA: Diagnosis not present

## 2017-09-14 NOTE — Progress Notes (Signed)
[facility]  [facility] Room Number: 245Y Place of Service:  SNF [phone]) Provider:  Durenda Age, NP  Patient Care Team: Kirk Ruths, MD as PCP - General (Internal Medicine)  Extended Emergency Contact Information Primary Emergency Contact: Dondra Spry [place] Phone: [phone] Mobile Phone: [phone] Relation: Daughter Secondary Emergency Contact: Costella Hatcher [place] of [place] Phone: [phone] Relation: Son  Code Status:  DNR  Goals of care: Advanced Directive information Advanced Directives 09/14/2017  Does Patient Have a Medical Advance Directive? Yes  Type of Paramedic of [place];Living will;Out of facility DNR (pink MOST or yellow form)  Does patient want to make changes to medical advance directive? No - Patient declined  Copy of [place] in Chart? Yes  Would patient like information on creating a medical advance directive? -  Pre-existing out of facility DNR order (yellow form or pink MOST form) Yellow form placed in chart (order not valid for inpatient use)     Chief Complaint  Patient presents with  . Medical Management of Chronic Issues    Routine Visit    HPI:  Pt is a 84 y.o. Chen seen today for medical management of chronic diseases.  She is a long-term care resident of [facility].  She has a PMH of colon cancer, borderline hypothyroidism, asthma with COPD, CKD stage III, melanoma, and osteopenia. She was reported to have been coughing. She was seen in her room with son and daughter-in-law at bedside. Family reported that she had gained Beth lbs in 3 months. Latest hgb is 11.7. Family reported to have recurrent UTI and follows up with urology. She is currently having restorative nursing therapy.   Past Medical History:  Diagnosis Date  . Allergic rhinitis 11/18/2013  . Asthma with COPD (chronic obstructive pulmonary  disease) ([place]) 11/18/2013   Last Assessment & Plan:  No flair ups noted recently and breathing is essentially at baseline.    . Asthma without status asthmaticus    unspecified  . Borderline hypothyroidism 11/18/2013   Last Assessment & Plan:  Energy and tsh are doing well  . Cancer [facility])    colon cancer- surgically removed in 2006  . Cholelithiases   . Chronic kidney disease   . Colon cancer ([place])   . Complete left bundle branch block (LBBB) 11/18/2013  . Constipation 11/18/2013  . Diverticulosis   . Hiatal hernia    right  . HLD (hyperlipidemia) 11/18/2013   Last Assessment & Plan:  Appropriate diet is followed and no myalgia's are noted.    . Hyperlipidemia, unspecified   . Hypertension   . Hypertensive kidney disease with CKD stage III ([place]) 11/18/2013   Last Assessment & Plan:  Is compliant with hypertensive medications without clear side effects or lack of control.  Nausea and itching are not symptomatic and nsaids are being avoided.    . Melanoma ([place])   . Osteopenia    Past Surgical History:  Procedure Laterality Date  . ABDOMINAL HYSTERECTOMY    . Bladder Lift    . BREAST EXCISIONAL BIOPSY Left Beth+ yrs ago   neg  . COLON RESECTION  2006   colon cancer  . COLON SURGERY    . COLON SURGERY Right   . COLONOSCOPY WITH PROPOFOL N/A 06/01/2017   Procedure: COLONOSCOPY WITH PROPOFOL;  Surgeon: [name], MD;  Location: [facility] ENDOSCOPY;  Service: Endoscopy;  Laterality: N/A;  . ESOPHAGOGASTRODUODENOSCOPY (EGD) WITH  PROPOFOL N/A 06/01/2017   Procedure: ESOPHAGOGASTRODUODENOSCOPY (EGD) WITH PROPOFOL;  Surgeon: [name], MD;  Location: [facility] ENDOSCOPY;  Service: Endoscopy;  Laterality: N/A;  . HEMICOLECTOMY Right   . SKIN CANCER EXCISION  2006   melanoma    No Known Allergies  Outpatient Encounter Medications as of 09/14/2017  Medication Sig  . acetaminophen (TYLENOL) 325 MG tablet Take 650 mg by mouth every 4 (four) hours as needed. for pain/ increased temp. May be  administered orally, per G-tube if needed or rectally if unable to swallow (separate order). Maximum dose for 24 hours is 3,000 mg from all sources of Acetaminophen/ Tylenol  . Amino Acids-Protein Hydrolys (FEEDING SUPPLEMENT, PRO-STAT SUGAR FREE 64,) LIQD Take 30 mLs by mouth 2 (two) times daily between meals. low protein, low albumin  . bisacodyl (DULCOLAX) 10 MG suppository Place 10 mg rectally 2 (two) times daily as needed.  . calcium carbonate (TUMS EX) 750 MG chewable tablet Chew 2 tablets by mouth daily.   . Camphor-Menthol-Methyl Sal 06-09-28 % CREA Apply 1 application topically 3 (three) times daily as needed. Apply thin film of topical to pain site. Okay to leave on bed side.  . Cholecalciferol (VITAMIN D-1000 MAX ST) 1000 units tablet Take 2,000 Units by mouth daily.   . Cranberry (CRANBERRY CONCENTRATE) 500 MG CAPS Take 1 capsule by mouth daily.   . cyanocobalamin 1000 MCG tablet Take 2,000 mcg by mouth daily.   . diclofenac sodium (VOLTAREN) 1 % GEL Apply 4 g topically 4 (four) times daily. Apply 4 grams to the right knee QID for pain,  . docusate sodium (COLACE) 100 MG capsule Take 1 capsule (100 mg total) by mouth daily as needed.  . ferrous sulfate 325 (65 FE) MG tablet Take 325 mg by mouth 2 (two) times daily.  . fluticasone (FLONASE) 50 MCG/ACT nasal spray USE 2 SPRAYS IN EACH       NOSTRIL DAILY  . fluticasone furoate-vilanterol (BREO ELLIPTA) 100-25 MCG/INH AEPB Inhale 1 puff into the lungs daily.   Marland Kitchen guaiFENesin (ROBITUSSIN) 100 MG/5ML liquid Take 200 mg by mouth every 4 (four) hours as needed for cough. Notify provider if cough worsens/ continues longer than 3 days.  Marland Kitchen ipratropium-albuterol (DUONEB) 0.5-2.5 (3) MG/3ML SOLN Take 3 mLs by nebulization 3 (three) times daily as needed.   . lactulose (CHRONULAC) 10 GM/15ML solution Take 30 g by mouth 2 (two) times daily as needed.  Marland Kitchen losartan (COZAAR) 25 MG tablet Take 25 mg by mouth daily as needed. Take 1 tablet PRN if BP is >150/90    . Nutritional Supplements (ENSURE ENLIVE PO) Take 1 Bottle by mouth 2 (two) times daily between meals.  . nystatin-triamcinolone (MYCOLOG II) cream Apply 1 application topically 2 (two) times daily. apply nystatin cream under the right breast to treat yeast infection/irritation  . pantoprazole (PROTONIX) 40 MG tablet Take 40 mg by mouth daily. before a meal to help with esophagus problems   . polyethylene glycol (MIRALAX / GLYCOLAX) packet Take 17 g by mouth daily.  . Probiotic Product (RISA-BID PROBIOTIC) TABS Take 1 tablet by mouth 2 (two) times daily.  Marland Kitchen trimethoprim (TRIMPEX) 100 MG tablet Take 100 mg by mouth daily.   . vitamin C (ASCORBIC ACID) 500 MG tablet Take 500 mg by mouth 2 (two) times daily. Potentiate iron absorption (anemia   No facility-administered encounter medications on file as of 09/14/2017.     Review of Systems  GENERAL: No change in appetite, no fatigue, no weight changes,  no fever, chills or weakness MOUTH and THROAT: Denies oral discomfort, gingival pain or bleeding RESPIRATORY: +cough CARDIAC: No chest pain, edema or palpitations GI: No abdominal pain, diarrhea, constipation, heart burn, nausea or vomiting PSYCHIATRIC: Denies feelings of depression or anxiety. No report of hallucinations, insomnia, paranoia, or agitation   Immunization History  Administered Date(s) Administered  . PPD Test 09/09/2016, 05/30/2017  . Pneumococcal Conjugate-13 12/16/2016  . Pneumococcal-Unspecified 11/28/2008   Pertinent  Health Maintenance Due  Topic Date Due  . DEXA SCAN  11/28/1988  . INFLUENZA VACCINE  09/30/2017  . PNA vac Low Risk Adult  Completed      Vitals:   09/14/17 1336  BP: (!) 149/87  Pulse: 66  Resp: 16  Temp: 97.8 F (36.6 C)  TempSrc: Oral  SpO2: 97%  Weight: 159 lb (72.1 kg)  Height: 5' (1.524 m)   Body mass index is 31.05 kg/m.  Physical Exam  GENERAL APPEARANCE: Well nourished. In no acute distress. Obese SKIN:  Bursitis on right  elbow MOUTH and THROAT: Lips are without lesions. Oral mucosa is moist and without lesions. Tongue is normal in shape, size, and color and without lesions RESPIRATORY: Breathing is even & unlabored, BS CTAB CARDIAC: RRR, no murmur,no extra heart sounds, no edema GI: Abdomen soft, normal BS, no masses, no tenderness EXTREMITIES:  Able to move X 4 extremities PSYCHIATRIC: Alert and oriented X 3. Affect and behavior are appropriate   Labs reviewed: Recent Labs    04/20/17 1505  06/07/17 0530 06/21/17 1542 06/27/17 1013  NA 133*   < > 137 133* 132*  K 3.6   < > 4.0 3.4* 3.4*  CL 93*   < > 102 98* 95*  CO2 29   < > 28 24 30   GLUCOSE 116*   < > 89 109* 89  BUN 39*   < > 18 21* 28*  CREATININE 0.93   < > 0.93 0.84 0.82  CALCIUM 10.0   < > 8.2* 8.2* 7.9*  MG 1.8  --   --   --   --    < > = values in this interval not displayed.   Recent Labs    06/07/17 0530 06/21/17 1542 06/27/17 1013  AST 18 24 19   ALT 13* 10* 11*  ALKPHOS 64 72 60  BILITOT 0.6 0.2* 0.7  PROT 4.8* 5.7* 5.2*  ALBUMIN 2.4* 3.0* 2.8*   Recent Labs    06/21/17 1542 06/29/17 0500 07/29/17 1531  WBC 7.3 8.9 12.6*  NEUTROABS 6.2 6.4 9.9*  HGB 9.5* 9.5* 11.7*  HCT 29.9* 29.0* 35.8  MCV 89.1 85.9 84.8  PLT 452* 395 465*   Lab Results  Component Value Date   TSH 3.269 04/20/2017    Lab Results  Component Value Date   CHOL 127 04/20/2017   HDL 74 04/20/2017   LDLCALC 40 04/20/2017   TRIG 66 04/20/2017   CHOLHDL 1.7 04/20/2017    Assessment/Plan  1. Cough - currently on guaifenesin PRN, will order chest x-ray   2. Essential hypertension - well-controlled, continue Losartan 25 mg daily   3. Iron deficiency anemia, unspecified iron deficiency anemia type - hgb 11.7, discontinue FeSO4   4. Asthma with COPD (chronic obstructive pulmonary disease) (HCC) - no wheezing, continue Breo Ellipta   5. Recurrent UTI - family reported to have increased confusion, continue Trimethoprim 100 mg daily, will  re-check urinalysis with culture and sensitivity    Family/ staff Communication: Discussed plan of care with  resident, daughter-in-law and son.  Labs/tests ordered:  Chest x-ray, BMP, CBC, urinalysis with culture and sensitivity  Goals of care:   Long-term care.   Durenda Age, NP [facility] [phone] (Monday-Friday 8:00 a.m. - 5:00 p.m.) [phone] (after hours)

## 2017-09-15 ENCOUNTER — Other Ambulatory Visit
Admission: RE | Admit: 2017-09-15 | Discharge: 2017-09-15 | Disposition: A | Discharge status: Home / Self Care | Payer: Medicare Other | Source: Ambulatory Visit | Attending: Internal Medicine | Admitting: Internal Medicine

## 2017-09-15 DIAGNOSIS — K922 Gastrointestinal hemorrhage, unspecified: Secondary | ICD-10-CM | POA: Insufficient documentation

## 2017-09-15 LAB — CBC WITH DIFFERENTIAL/PLATELET
BASOS ABS: 0.1 10*3/uL (ref 0–0.1)
BASOS PCT: 1 %
EOS ABS: 1 10*3/uL — AB (ref 0–0.7)
EOS PCT: 8 %
HCT: 38.5 % (ref 35.0–47.0)
Hemoglobin: 12.9 g/dL (ref 12.0–16.0)
Lymphocytes Relative: 9 %
Lymphs Abs: 1.1 10*3/uL (ref 1.0–3.6)
MCH: 28.4 pg (ref 26.0–34.0)
MCHC: 33.4 g/dL (ref 32.0–36.0)
MCV: 85.1 fL (ref 80.0–100.0)
Monocytes Absolute: 0.9 10*3/uL (ref 0.2–0.9)
Monocytes Relative: 7 %
Neutro Abs: 10.1 10*3/uL — ABNORMAL HIGH (ref 1.4–6.5)
Neutrophils Relative %: 77 %
PLATELETS: 363 10*3/uL (ref 150–440)
RBC: 4.52 MIL/uL (ref 3.80–5.20)
RDW: 16.7 % — AB (ref 11.5–14.5)
WBC: 13.2 10*3/uL — AB (ref 3.6–11.0)

## 2017-09-15 LAB — BASIC METABOLIC PANEL
ANION GAP: 9 (ref 5–15)
BUN: 28 mg/dL — ABNORMAL HIGH (ref 8–23)
CALCIUM: 8.8 mg/dL — AB (ref 8.9–10.3)
CO2: 26 mmol/L (ref 22–32)
Chloride: 99 mmol/L (ref 98–111)
Creatinine, Ser: 1.05 mg/dL — ABNORMAL HIGH (ref 0.44–1.00)
GFR calc Af Amer: 51 mL/min — ABNORMAL LOW (ref >60)
GFR, EST NON AFRICAN AMERICAN: 44 mL/min — AB (ref >60)
Glucose, Bld: 128 mg/dL — ABNORMAL HIGH (ref 70–99)
Potassium: 4.2 mmol/L (ref 3.5–5.1)
SODIUM: 134 mmol/L — AB (ref 135–145)

## 2017-09-20 ENCOUNTER — Encounter: Payer: Self-pay | Admitting: Urology

## 2017-09-20 ENCOUNTER — Ambulatory Visit: Payer: Medicare Other | Admitting: Urology

## 2017-09-20 VITALS — BP 113/85 | HR 80 | Ht 60.0 in

## 2017-09-20 DIAGNOSIS — N390 Urinary tract infection, site not specified: Secondary | ICD-10-CM

## 2017-09-20 LAB — URINALYSIS, COMPLETE
Bilirubin, UA: NEGATIVE
Glucose, UA: NEGATIVE
Ketones, UA: NEGATIVE
Nitrite, UA: POSITIVE — AB
PH UA: 7 (ref 5.0–7.5)
Protein, UA: NEGATIVE
Specific Gravity, UA: 1.015 (ref 1.005–1.030)
Urobilinogen, Ur: 0.2 mg/dL (ref 0.2–1.0)

## 2017-09-20 LAB — MICROSCOPIC EXAMINATION
Epithelial Cells (non renal): NONE SEEN /hpf (ref 0–10)
WBC, UA: >30 /hpf — ABNORMAL HIGH (ref 0–5)

## 2017-09-20 NOTE — Progress Notes (Signed)
09/20/2017 2:10 PM   Robi Mitter [birth year] [id number]  Referring provider: Kirk Ruths, MD [place] [facility] [place], [place] [address]  Chief Complaint  Patient presents with  . Recurrent UTI    HPI: I was consultedto assess the patient's probable urinary tract infection. She was voiding small amounts with some difficulty urinating. It appears she was called by her primary care physician and treated as a urinary tract infection. The symptoms normalized.  Her flow was reasonable. She voids every few hours. She gets up twice at night. She had a bladder or prolapse operation years ago. She does not get a lot of bladder infections   Details of the history were obtained from the patient and her daughter.  The patient does report at times she gets cystitis symptoms but other times it appears she will then get CNS symptoms.  She has been the emergency room a few times.    I reviewed her medical records and in the last 3 months she has had at least 2+ cultures and one negative.  She has had one negative blood culture.  She had normal kidneys on a CT stone protocol April 14, 2017    The role of prophylaxis with triprim discussed.  The role of the catheter urine to ensure sterility today discussed.  I did mention that patients can be chronically colonized but we need to assume that the positive cultures have been related to some of her CNS symptoms.  Today Frequency stable.  Last urine culture negative.  Clinically not infected2   PMH: Past Medical History:  Diagnosis Date  . Allergic rhinitis 11/18/2013  . Asthma with COPD (chronic obstructive pulmonary disease) ([place]) 11/18/2013   Last Assessment & Plan:  No flair ups noted recently and breathing is essentially at baseline.    . Asthma without status asthmaticus    unspecified  . Borderline hypothyroidism 11/18/2013   Last Assessment & Plan:  Energy and tsh are doing well  . Cancer [facility])    colon  cancer- surgically removed in 2006  . Cholelithiases   . Chronic kidney disease   . Colon cancer ([place])   . Complete left bundle branch block (LBBB) 11/18/2013  . Constipation 11/18/2013  . Diverticulosis   . Hiatal hernia    right  . HLD (hyperlipidemia) 11/18/2013   Last Assessment & Plan:  Appropriate diet is followed and no myalgia's are noted.    . Hyperlipidemia, unspecified   . Hypertension   . Hypertensive kidney disease with CKD stage III ([place]) 11/18/2013   Last Assessment & Plan:  Is compliant with hypertensive medications without clear side effects or lack of control.  Nausea and itching are not symptomatic and nsaids are being avoided.    . Melanoma ([place])   . Osteopenia     Surgical History: Past Surgical History:  Procedure Laterality Date  . ABDOMINAL HYSTERECTOMY    . Bladder Lift    . BREAST EXCISIONAL BIOPSY Left 15+ yrs ago   neg  . COLON RESECTION  2006   colon cancer  . COLON SURGERY    . COLON SURGERY Right   . COLONOSCOPY WITH PROPOFOL N/A 06/01/2017   Procedure: COLONOSCOPY WITH PROPOFOL;  Surgeon: [name], MD;  Location: [facility] ENDOSCOPY;  Service: Endoscopy;  Laterality: N/A;  . ESOPHAGOGASTRODUODENOSCOPY (EGD) WITH PROPOFOL N/A 06/01/2017   Procedure: ESOPHAGOGASTRODUODENOSCOPY (EGD) WITH PROPOFOL;  Surgeon: [name], MD;  Location: [facility] ENDOSCOPY;  Service: Endoscopy;  Laterality:  N/A;  . HEMICOLECTOMY Right   . SKIN CANCER EXCISION  2006   melanoma    Home Medications:  Allergies as of 09/20/2017   No Known Allergies     Medication List        Accurate as of 09/20/17  2:10 PM. Always use your most recent med list.          acetaminophen 325 MG tablet Commonly known as:  TYLENOL Take 650 mg by mouth every 4 (four) hours as needed. for pain/ increased temp. May be administered orally, per G-tube if needed or rectally if unable to swallow (separate order). Maximum dose for 24 hours is 3,000 mg from all sources of Acetaminophen/ Tylenol     bisacodyl 10 MG suppository Commonly known as:  DULCOLAX Place 10 mg rectally 2 (two) times daily as needed.   calcium carbonate 750 MG chewable tablet Commonly known as:  TUMS EX Chew 2 tablets by mouth daily.   Camphor-Menthol-Methyl Sal 06-09-28 % Crea Apply 1 application topically 3 (three) times daily as needed. Apply thin film of topical to pain site. Okay to leave on bed side.   CRANBERRY CONCENTRATE 500 MG Caps Generic drug:  Cranberry Take 1 capsule by mouth daily.   cyanocobalamin 1000 MCG tablet Take 2,000 mcg by mouth daily.   diclofenac sodium 1 % Gel Commonly known as:  VOLTAREN Apply 4 g topically 4 (four) times daily. Apply 4 grams to the right knee QID for pain,   docusate sodium 100 MG capsule Commonly known as:  COLACE Take 1 capsule (100 mg total) by mouth daily as needed.   ENSURE ENLIVE PO Take 1 Bottle by mouth 2 (two) times daily between meals.   feeding supplement (PRO-STAT SUGAR FREE 64) Liqd Take 30 mLs by mouth 2 (two) times daily between meals. low protein, low albumin   ferrous sulfate 325 (65 FE) MG tablet Take 325 mg by mouth 2 (two) times daily.   fluticasone 50 MCG/ACT nasal spray Commonly known as:  FLONASE USE 2 SPRAYS IN EACH       NOSTRIL DAILY   fluticasone furoate-vilanterol 100-25 MCG/INH Aepb Commonly known as:  BREO ELLIPTA Inhale 1 puff into the lungs daily.   ipratropium-albuterol 0.5-2.5 (3) MG/3ML Soln Commonly known as:  DUONEB Take 3 mLs by nebulization 3 (three) times daily as needed.   lactulose 10 GM/15ML solution Commonly known as:  CHRONULAC Take 30 g by mouth 2 (two) times daily as needed.   losartan 25 MG tablet Commonly known as:  COZAAR Take 25 mg by mouth daily as needed. Take 1 tablet PRN if BP is >150/90   nystatin-triamcinolone cream Commonly known as:  MYCOLOG II Apply 1 application topically 2 (two) times daily. apply nystatin cream under the right breast to treat yeast infection/irritation    pantoprazole 40 MG tablet Commonly known as:  PROTONIX Take 40 mg by mouth daily. before a meal to help with esophagus problems   polyethylene glycol packet Commonly known as:  MIRALAX / GLYCOLAX Take 17 g by mouth daily.   RISA-BID PROBIOTIC Tabs Take 1 tablet by mouth 2 (two) times daily.   trimethoprim 100 MG tablet Commonly known as:  TRIMPEX Take 100 mg by mouth daily.   vitamin C 500 MG tablet Commonly known as:  ASCORBIC ACID Take 500 mg by mouth 2 (two) times daily. Potentiate iron absorption (anemia   VITAMIN D-1000 MAX ST 1000 units tablet Generic drug:  Cholecalciferol Take 2,000 Units by mouth  daily.       Allergies: No Known Allergies  Family History: Family History  Problem Relation Age of Onset  . Breast cancer Daughter 9       passed at 54 from breast cancer  . Heart disease Mother   . Heart failure Mother   . Lung cancer Father     Social History:  reports that she has never smoked. She has never used smokeless tobacco. She reports that she does not drink alcohol or use drugs.  ROS:                                        Physical Exam: There were no vitals taken for this visit.  Constitutional:  Alert and oriented, No acute distress.  Laboratory Data: Lab Results  Component Value Date   WBC 13.2 (H) 09/15/2017   HGB 12.9 09/15/2017   HCT 38.5 09/15/2017   MCV 85.1 09/15/2017   PLT 363 09/15/2017    Lab Results  Component Value Date   CREATININE 1.05 (H) 09/15/2017    No results found for: PSA  No results found for: TESTOSTERONE  No results found for: HGBA1C  Urinalysis    Component Value Date/Time   COLORURINE YELLOW (A) 07/13/2017 2241   APPEARANCEUR Clear 08/09/2017 1612   LABSPEC 1.021 07/13/2017 2241   LABSPEC 1.021 09/19/2011 1607   PHURINE 5.0 07/13/2017 2241   GLUCOSEU Negative 08/09/2017 1612   GLUCOSEU Negative 09/19/2011 1607   HGBUR NEGATIVE 07/13/2017 2241   BILIRUBINUR Negative  08/09/2017 1612   BILIRUBINUR Negative 09/19/2011 [place] 07/13/2017 2241   PROTEINUR Negative 08/09/2017 1612   PROTEINUR NEGATIVE 07/13/2017 2241   NITRITE Negative 08/09/2017 1612   NITRITE NEGATIVE 07/13/2017 2241   LEUKOCYTESUR Negative 08/09/2017 1612   LEUKOCYTESUR Trace 09/19/2011 1607    Pertinent Imaging:   Assessment & Plan: Reassess in 6 months.  Send urine for culture to prove colonization.  I told the family I would not be treating it  There are no diagnoses linked to this encounter.  No follow-ups on file.  Reece Packer, MD  [facility] [address], [place], [place] [address] [phone] 2

## 2017-09-20 NOTE — Progress Notes (Signed)
In and Out Catheterization  Patient is present today for a I & O catheterization due to recurrent UTI. Patient was cleaned and prepped in a sterile fashion with betadine.  A 14FR cath was inserted no complications were noted , 25ml of urine return was noted, urine was yellow in color. A clean urine sample was collected for UA, UCX. Bladder was drained  And catheter was removed with out difficulty.    Preformed by: Elberta Leatherwood, CMA

## 2017-09-24 LAB — CULTURE, URINE COMPREHENSIVE

## 2017-09-30 ENCOUNTER — Encounter
Admission: RE | Admit: 2017-09-30 | Discharge: 2017-09-30 | Disposition: A | Discharge status: Home / Self Care | Payer: Medicare Other | Source: Ambulatory Visit | Attending: Internal Medicine | Admitting: Internal Medicine

## 2017-10-01 ENCOUNTER — Other Ambulatory Visit
Admission: RE | Admit: 2017-10-01 | Discharge: 2017-10-01 | Disposition: A | Discharge status: Home / Self Care | Payer: Medicare Other | Source: Skilled Nursing Facility | Attending: Internal Medicine | Admitting: Internal Medicine

## 2017-10-01 DIAGNOSIS — Z87440 Personal history of urinary (tract) infections: Secondary | ICD-10-CM | POA: Insufficient documentation

## 2017-10-01 LAB — CBC WITH DIFFERENTIAL/PLATELET
BAND NEUTROPHILS: 0 %
BASOS PCT: 0 %
Basophils Absolute: 0 10*3/uL (ref 0–0.1)
Blasts: 0 %
EOS ABS: 0.7 10*3/uL (ref 0–0.7)
Eosinophils Relative: 6 %
HCT: 37.7 % (ref 35.0–47.0)
HEMOGLOBIN: 12.5 g/dL (ref 12.0–16.0)
LYMPHS PCT: 14 %
Lymphs Abs: 1.6 10*3/uL (ref 1.0–3.6)
MCH: 28.2 pg (ref 26.0–34.0)
MCHC: 33.1 g/dL (ref 32.0–36.0)
MCV: 85.2 fL (ref 80.0–100.0)
Metamyelocytes Relative: 1 %
Monocytes Absolute: 0.7 10*3/uL (ref 0.2–0.9)
Monocytes Relative: 6 %
Myelocytes: 1 %
NEUTROS ABS: 8.7 10*3/uL — AB (ref 1.4–6.5)
Neutrophils Relative %: 72 %
OTHER: 0 %
PROMYELOCYTES RELATIVE: 0 %
Platelets: 321 10*3/uL (ref 150–440)
RBC: 4.42 MIL/uL (ref 3.80–5.20)
RDW: 16.7 % — AB (ref 11.5–14.5)
WBC: 11.7 10*3/uL — ABNORMAL HIGH (ref 3.6–11.0)
nRBC: 0 /100 WBC

## 2017-10-01 LAB — BRAIN NATRIURETIC PEPTIDE: B Natriuretic Peptide: 48 pg/mL (ref 0.0–100.0)

## 2017-10-12 ENCOUNTER — Non-Acute Institutional Stay (SKILLED_NURSING_FACILITY): Payer: Medicare Other | Admitting: Adult Health

## 2017-10-12 ENCOUNTER — Encounter: Payer: Self-pay | Admitting: Adult Health

## 2017-10-12 DIAGNOSIS — I10 Essential (primary) hypertension: Secondary | ICD-10-CM

## 2017-10-12 DIAGNOSIS — L03113 Cellulitis of right upper limb: Secondary | ICD-10-CM | POA: Diagnosis not present

## 2017-10-12 DIAGNOSIS — J309 Allergic rhinitis, unspecified: Secondary | ICD-10-CM | POA: Diagnosis not present

## 2017-10-12 DIAGNOSIS — E538 Deficiency of other specified B group vitamins: Secondary | ICD-10-CM

## 2017-10-12 NOTE — Progress Notes (Signed)
[facility]  [facility] Room Number: 361W Place of Service:  SNF ([phone]) Provider:  Durenda Age, NP  Patient Care Team: Kirk Ruths, MD as PCP - General (Internal Medicine)  Extended Emergency Contact Information Primary Emergency Contact: Dondra Spry [place] Phone: [phone] Mobile Phone: [phone] Relation: Daughter Secondary Emergency Contact: Costella Hatcher [place] of [place] Phone: [phone] Relation: Son  Code Status:  DNR  Goals of care: Advanced Directive information Advanced Directives 10/12/2017  Does Patient Have a Medical Advance Directive? Yes  Type of Paramedic of [place];Out of facility DNR (pink MOST or yellow form);Living will  Does patient want to make changes to medical advance directive? No - Patient declined  Copy of [place] in Chart? Yes  Would patient like information on creating a medical advance directive? -  Pre-existing out of facility DNR order (yellow form or pink MOST form) Yellow form placed in chart (order not valid for inpatient use)     Chief Complaint  Patient presents with  . Medical Management of Chronic Issues    Routine visit    HPI:  Beth Chen is a 84 y.o. female seen today for medical management of chronic diseases. She is a long-term care resident of [facility].  She was seen in the room today. She was noted to have an erythematous and edematous right elbow. It is soft and slightly warm. She denies pain on left elbow and able to move it without difficulty. She has PMH of CKD, COPD, allergic rhinitis and cholelithiasis.   Past Medical History:  Diagnosis Date  . Allergic rhinitis 11/18/2013  . Asthma with COPD (chronic obstructive pulmonary disease) ([place]) 11/18/2013   Last Assessment & Plan:  No flair ups noted recently and breathing is essentially at baseline.    . Asthma without status asthmaticus    unspecified  . Borderline hypothyroidism 11/18/2013   Last Assessment & Plan:  Energy and tsh are doing well  . Cancer [facility])    colon cancer- surgically removed in 2006  . Cholelithiases   . Chronic kidney disease   . Colon cancer ([place])   . Complete left bundle branch block (LBBB) 11/18/2013  . Constipation 11/18/2013  . Diverticulosis   . Hiatal hernia    right  . HLD (hyperlipidemia) 11/18/2013   Last Assessment & Plan:  Appropriate diet is followed and no myalgia's are noted.    . Hyperlipidemia, unspecified   . Hypertension   . Hypertensive kidney disease with CKD stage III ([place]) 11/18/2013   Last Assessment & Plan:  Is compliant with hypertensive medications without clear side effects or lack of control.  Nausea and itching are not symptomatic and nsaids are being avoided.    . Melanoma ([place])   . Osteopenia    Past Surgical History:  Procedure Laterality Date  . ABDOMINAL HYSTERECTOMY    . Bladder Lift    . BREAST EXCISIONAL BIOPSY Left 15+ yrs ago   neg  . COLON RESECTION  2006   colon cancer  . COLON SURGERY    . COLON SURGERY Right   . COLONOSCOPY WITH PROPOFOL N/A 06/01/2017   Procedure: COLONOSCOPY WITH PROPOFOL;  Surgeon: [name], MD;  Location: [facility] ENDOSCOPY;  Service: Endoscopy;  Laterality: N/A;  . ESOPHAGOGASTRODUODENOSCOPY (EGD) WITH PROPOFOL N/A 06/01/2017   Procedure: ESOPHAGOGASTRODUODENOSCOPY (EGD) WITH PROPOFOL;  Surgeon: [name], MD;  Location: [facility] ENDOSCOPY;  Service: Endoscopy;  Laterality: N/A;  . HEMICOLECTOMY  Right   . SKIN CANCER EXCISION  2006   melanoma    No Known Allergies  Outpatient Encounter Medications as of 10/12/2017  Medication Sig  . acetaminophen (TYLENOL) 325 MG tablet Take 650 mg by mouth every 4 (four) hours as needed. for pain/ increased temp. May be administered orally, per G-tube if needed or rectally if unable to swallow (separate order). Maximum dose for 24 hours is 3,000 mg from all sources of Acetaminophen/ Tylenol  .  Amino Acids-Protein Hydrolys (FEEDING SUPPLEMENT, PRO-STAT SUGAR FREE 64,) LIQD Take 30 mLs by mouth 2 (two) times daily between meals. low protein, low albumin  . bisacodyl (DULCOLAX) 10 MG suppository Place 10 mg rectally 2 (two) times daily as needed.  . calcium carbonate (TUMS EX) 750 MG chewable tablet Chew 2 tablets by mouth daily.   . Camphor-Menthol-Methyl Sal 06-09-28 % CREA Apply 1 application topically 3 (three) times daily as needed. Apply thin film of topical to pain site. Okay to leave on bed side.  . Cholecalciferol (VITAMIN D-1000 MAX ST) 1000 units tablet Take 2,000 Units by mouth daily.   . Cranberry (CRANBERRY CONCENTRATE) 500 MG CAPS Take 1 capsule by mouth daily.   . cyanocobalamin 1000 MCG tablet Take 2,000 mcg by mouth daily.   . diclofenac sodium (VOLTAREN) 1 % GEL Apply 4 g topically 4 (four) times daily. Apply 4 grams to the right knee QID for pain,  . docusate sodium (COLACE) 100 MG capsule Take 1 capsule (100 mg total) by mouth daily as needed.  . fluticasone (FLONASE) 50 MCG/ACT nasal spray Place 2 sprays into both nostrils daily.   . fluticasone furoate-vilanterol (BREO ELLIPTA) 100-25 MCG/INH AEPB Inhale 1 puff into the lungs daily.   Marland Kitchen ipratropium-albuterol (DUONEB) 0.5-2.5 (3) MG/3ML SOLN Take 3 mLs by nebulization 3 (three) times daily.   Marland Kitchen lactulose (CHRONULAC) 10 GM/15ML solution Take 30 g by mouth 2 (two) times daily as needed.  Marland Kitchen losartan (COZAAR) 25 MG tablet Take 25 mg by mouth daily as needed. Take 1 tablet PRN if BP is >150/90   . Nutritional Supplements (ENSURE ENLIVE PO) Take 1 Bottle by mouth 2 (two) times daily between meals.  . nystatin-triamcinolone (MYCOLOG II) cream Apply 1 application topically 2 (two) times daily. apply nystatin cream under the right breast to treat yeast infection/irritation  . pantoprazole (PROTONIX) 40 MG tablet Take 40 mg by mouth daily. before a meal to help with esophagus problems   . polyethylene glycol (MIRALAX / GLYCOLAX)  packet Take 17 g by mouth daily.  . Probiotic Product (RISA-BID PROBIOTIC) TABS Take 1 tablet by mouth 2 (two) times daily.  Marland Kitchen trimethoprim (TRIMPEX) 100 MG tablet Take 100 mg by mouth daily.   . vitamin C (ASCORBIC ACID) 500 MG tablet Take 500 mg by mouth 2 (two) times daily. Potentiate iron absorption (anemia  . [DISCONTINUED] ferrous sulfate 325 (65 FE) MG tablet Take 325 mg by mouth 2 (two) times daily.  . [DISCONTINUED] UNABLE TO FIND Diet Type: Regular   No facility-administered encounter medications on file as of 10/12/2017.     Review of Systems  GENERAL: No fatigue, no fever, chills or weakness MOUTH and THROAT: Denies oral discomfort, gingival pain or bleeding  RESPIRATORY: no cough, SOB, DOE, wheezing, hemoptysis CARDIAC: No chest pain, edema or palpitations GI: No abdominal pain, diarrhea, constipation, heart burn, nausea or vomiting GU: Denies dysuria, frequency, hematuria, incontinence, or discharge PSYCHIATRIC: Denies feelings of depression or anxiety. No report of hallucinations, insomnia, paranoia,  or agitation   Immunization History  Administered Date(s) Administered  . PPD Test 09/09/2016, 05/30/2017  . Pneumococcal Conjugate-13 12/16/2016  . Pneumococcal-Unspecified 11/28/2008   Pertinent  Health Maintenance Due  Topic Date Due  . DEXA SCAN  11/28/1988  . INFLUENZA VACCINE  09/30/2017  . PNA vac Low Risk Adult  Completed      Vitals:   10/12/17 1223  BP: 136/85  Pulse: 73  Resp: 18  Temp: (!) 97.4 F (36.3 C)  TempSrc: Oral  SpO2: 97%  Weight: 157 lb 14.4 oz (71.6 kg)  Height: 5' (1.524 m)   Body mass index is 30.84 kg/m.  Physical Exam  GENERAL APPEARANCE: Well nourished. In no acute distress. Obese SKIN:  Right elbow is edematous and erythematous MOUTH and THROAT: Lips are without lesions. Oral mucosa is moist and without lesions. Tongue is normal in shape, size, and color and without lesions RESPIRATORY: Breathing is even & unlabored, BS  CTAB CARDIAC: RRR, no murmur,no extra heart sounds, no edema GI: Abdomen soft, normal BS, no masses, no tenderness EXTREMITIES:  Able to move X 4 extremities PSYCHIATRIC: Alert to self, disoriented to time and place. Affect and behavior are appropriate  Labs reviewed: Recent Labs    04/20/17 1505  06/21/17 1542 06/27/17 1013 09/15/17 1441  NA 133*   < > 133* 132* 134*  K 3.6   < > 3.4* 3.4* 4.2  CL 93*   < > 98* 95* 99  CO2 29   < > 24 30 26   GLUCOSE 116*   < > 109* 89 128*  BUN 39*   < > 21* 28* 28*  CREATININE 0.93   < > 0.84 0.82 1.05*  CALCIUM 10.0   < > 8.2* 7.9* 8.8*  MG 1.8  --   --   --   --    < > = values in this interval not displayed.   Recent Labs    06/07/17 0530 06/21/17 1542 06/27/17 1013  AST 18 24 19   ALT 13* 10* 11*  ALKPHOS 64 72 60  BILITOT 0.6 0.2* 0.7  PROT 4.8* 5.7* 5.2*  ALBUMIN 2.4* 3.0* 2.8*   Recent Labs    07/29/17 1531 09/15/17 1441 10/01/17 0600  WBC 12.6* 13.2* 11.7*  NEUTROABS 9.9* 10.1* 8.7*  HGB 11.7* 12.9 12.5  HCT 35.8 38.5 37.7  MCV 84.8 85.1 85.2  PLT 465* 363 321   Lab Results  Component Value Date   TSH 3.269 04/20/2017    Lab Results  Component Value Date   CHOL 127 04/20/2017   HDL 74 04/20/2017   LDLCALC 40 04/20/2017   TRIG 66 04/20/2017   CHOLHDL 1.7 04/20/2017    Assessment/Plan  1. Cellulitis of right elbow -  [start cephalexin 500 mg 1 tab p.o. 4 times daily x7 days and Florastor 250 mg 1 capsule twice daily x10 days, cleanse right elbow with normal saline and cover with Allevyn q. 3 days and as needed, keep skin clean and dry   2. Vitamin B12 deficiency -continue vitamin B12 1000 mcg 2 tabs = 2000 mcg daily   3. Essential hypertension, benign -well-controlled, continue losartan 25 mg 1 tab daily   4. Allergic rhinitis, unspecified seasonality, unspecified trigger -stable, continue Flonase 50 mcg/ACT 2 sprays into each nostril daily    Family/ staff Communication: Discussed plan of care with  resident.  Labs/tests ordered:  None  Goals of care:   Long-term care.   Durenda Age, NP [facility] [phone] (Monday-Friday 8:00 a.m. - 5:00 p.m.) [phone] (after hours)

## 2017-10-22 ENCOUNTER — Other Ambulatory Visit
Admission: RE | Admit: 2017-10-22 | Discharge: 2017-10-22 | Disposition: A | Discharge status: Home / Self Care | Payer: Medicare Other | Source: Ambulatory Visit | Attending: Internal Medicine | Admitting: Internal Medicine

## 2017-10-22 DIAGNOSIS — N189 Chronic kidney disease, unspecified: Secondary | ICD-10-CM | POA: Diagnosis present

## 2017-10-22 LAB — BASIC METABOLIC PANEL
ANION GAP: 9 (ref 5–15)
BUN: 22 mg/dL (ref 8–23)
CALCIUM: 8.7 mg/dL — AB (ref 8.9–10.3)
CO2: 25 mmol/L (ref 22–32)
CREATININE: 0.88 mg/dL (ref 0.44–1.00)
Chloride: 98 mmol/L (ref 98–111)
GFR calc Af Amer: >60 mL/min (ref >60)
GFR, EST NON AFRICAN AMERICAN: 55 mL/min — AB (ref >60)
GLUCOSE: 143 mg/dL — AB (ref 70–99)
Potassium: 4.4 mmol/L (ref 3.5–5.1)
Sodium: 132 mmol/L — ABNORMAL LOW (ref 135–145)

## 2017-10-27 ENCOUNTER — Encounter: Payer: Self-pay | Admitting: Adult Health

## 2017-10-31 ENCOUNTER — Encounter
Admission: RE | Admit: 2017-10-31 | Discharge: 2017-10-31 | Disposition: A | Discharge status: Home / Self Care | Payer: Medicare Other | Source: Ambulatory Visit | Attending: Internal Medicine | Admitting: Internal Medicine

## 2017-11-08 ENCOUNTER — Non-Acute Institutional Stay (SKILLED_NURSING_FACILITY): Payer: Medicare Other | Admitting: Adult Health

## 2017-11-08 ENCOUNTER — Encounter: Payer: Self-pay | Admitting: Adult Health

## 2017-11-08 DIAGNOSIS — K2100 Gastro-esophageal reflux disease with esophagitis, without bleeding: Secondary | ICD-10-CM

## 2017-11-08 DIAGNOSIS — M1711 Unilateral primary osteoarthritis, right knee: Secondary | ICD-10-CM

## 2017-11-08 DIAGNOSIS — J449 Chronic obstructive pulmonary disease, unspecified: Secondary | ICD-10-CM

## 2017-11-08 DIAGNOSIS — N183 Chronic kidney disease, stage 3 unspecified: Secondary | ICD-10-CM

## 2017-11-08 DIAGNOSIS — N390 Urinary tract infection, site not specified: Secondary | ICD-10-CM

## 2017-11-08 DIAGNOSIS — I129 Hypertensive chronic kidney disease with stage 1 through stage 4 chronic kidney disease, or unspecified chronic kidney disease: Secondary | ICD-10-CM | POA: Diagnosis not present

## 2017-11-08 DIAGNOSIS — I10 Essential (primary) hypertension: Secondary | ICD-10-CM

## 2017-11-08 NOTE — Progress Notes (Signed)
[facility] Room Number: [phone] Place of Service:  SNF (31)   CODE STATUS: DNR  No Known Allergies  Chief Complaint  Patient presents with  . Medical Management of Chronic Issues    Hypertension; asthma with copd; reflux esophagitis     HPI:  She is a 84 year old long term resident of this facility being seen for the management of her chronic illnesses; hypertension; asthma with copd; reflux esophagitis. She denies any heart burn; no cough or shortness of breath. There are no nursing concerns at this time.   Past Medical History:  Diagnosis Date  . Allergic rhinitis 11/18/2013  . Asthma with COPD (chronic obstructive pulmonary disease) ([place]) 11/18/2013   Last Assessment & Plan:  No flair ups noted recently and breathing is essentially at baseline.    . Asthma without status asthmaticus    unspecified  . Borderline hypothyroidism 11/18/2013   Last Assessment & Plan:  Energy and tsh are doing well  . Cancer [facility])    colon cancer- surgically removed in 2006  . Cholelithiases   . Chronic kidney disease   . Colon cancer ([place])   . Complete left bundle branch block (LBBB) 11/18/2013  . Constipation 11/18/2013  . Diverticulosis   . Hiatal hernia    right  . HLD (hyperlipidemia) 11/18/2013   Last Assessment & Plan:  Appropriate diet is followed and no myalgia's are noted.    . Hyperlipidemia, unspecified   . Hypertensive kidney disease with CKD stage III ([place]) 11/18/2013   Last Assessment & Plan:  Is compliant with hypertensive medications without clear side effects or lack of control.  Nausea and itching are not symptomatic and nsaids are being avoided.    . Melanoma ([place])   . Osteopenia     Past Surgical History:  Procedure Laterality Date  . ABDOMINAL HYSTERECTOMY    . Bladder Lift    . BREAST EXCISIONAL BIOPSY Left 15+ yrs ago   neg  . COLON RESECTION  2006   colon cancer  . COLON SURGERY    . COLON SURGERY Right   . COLONOSCOPY WITH  PROPOFOL N/A 06/01/2017   Procedure: COLONOSCOPY WITH PROPOFOL;  Surgeon: [name], MD;  Location: [facility] ENDOSCOPY;  Service: Endoscopy;  Laterality: N/A;  . ESOPHAGOGASTRODUODENOSCOPY (EGD) WITH PROPOFOL N/A 06/01/2017   Procedure: ESOPHAGOGASTRODUODENOSCOPY (EGD) WITH PROPOFOL;  Surgeon: [name], MD;  Location: [facility] ENDOSCOPY;  Service: Endoscopy;  Laterality: N/A;  . HEMICOLECTOMY Right   . SKIN CANCER EXCISION  2006   melanoma    Social History   Socioeconomic History  . Marital status: Widowed    Spouse name: Not on file  . Number of children: Not on file  . Years of education: 53  . Highest education level: Bachelor's degree (e.g., BA, AB, BS)  Occupational History  . Not on file  Social Needs  . Financial resource strain: Not on file  . Food insecurity:    Worry: Not on file    Inability: Not on file  . Transportation needs:    Medical: Not on file    Non-medical: Not on file  Tobacco Use  . Smoking status: Never Smoker  . Smokeless tobacco: Never Used  Substance and Sexual Activity  . Alcohol use: No  . Drug use: Never  . Sexual activity: Not on file  Lifestyle  . Physical activity:    Days per week: Not on file    Minutes per session:  Not on file  . Stress: Not on file  Relationships  . Social connections:    Talks on phone: Not on file    Gets together: Not on file    Attends religious service: Not on file    Active member of club or organization: Not on file    Attends meetings of clubs or organizations: Not on file    Relationship status: Not on file  . Intimate partner violence:    Fear of current or ex partner: Not on file    Emotionally abused: Not on file    Physically abused: Not on file    Forced sexual activity: Not on file  Other Topics Concern  . Not on file  Social History Narrative   From assisted living facility, however after recent discharge from the hospital-discharge to short-term rehab   -Most transfers all through wheelchair       Non smoker   No smokeless tobacco use   No alcohol use   Widowed   DNR with HCPOA and Living Will    Family History  Problem Relation Age of Onset  . Breast cancer Daughter 44       passed at 14 from breast cancer  . Heart disease Mother   . Heart failure Mother   . Lung cancer Father       VITAL SIGNS BP 115/75   Pulse 80   Temp 97.9 F (36.6 C) (Oral)   Resp 18   Ht 5' (1.524 m)   Wt 157 lb 14.4 oz (71.6 kg)   SpO2 99%   BMI 30.84 kg/m   Outpatient Encounter Medications as of 11/08/2017  Medication Sig  . acetaminophen (TYLENOL) 325 MG tablet Take 650 mg by mouth every 4 (four) hours as needed. for pain/ increased temp. May be administered orally, per G-tube if needed or rectally if unable to swallow (separate order). Maximum dose for 24 hours is 3,000 mg from all sources of Acetaminophen/ Tylenol  . Amino Acids-Protein Hydrolys (FEEDING SUPPLEMENT, PRO-STAT SUGAR FREE 64,) LIQD Take 30 mLs by mouth 2 (two) times daily between meals. low protein, low albumin  . bisacodyl (DULCOLAX) 10 MG suppository Place 10 mg rectally 2 (two) times daily as needed.  . calcium carbonate (TUMS EX) 750 MG chewable tablet Chew 2 tablets by mouth daily.   . Camphor-Menthol-Methyl Sal 06-09-28 % CREA Apply 1 application topically 3 (three) times daily as needed. Apply thin film of topical to pain site. Okay to leave on bed side.  . Cholecalciferol (VITAMIN D-1000 MAX ST) 1000 units tablet Take 2,000 Units by mouth daily.   . Cranberry (CRANBERRY CONCENTRATE) 500 MG CAPS Take 1 capsule by mouth daily.   . cyanocobalamin 1000 MCG tablet Take 2,000 mcg by mouth daily.   . diclofenac sodium (VOLTAREN) 1 % GEL Apply 4 g topically 4 (four) times daily. Apply 4 grams to the right knee QID for pain,  . docusate sodium (COLACE) 100 MG capsule Take 1 capsule (100 mg total) by mouth daily as needed.  . fluticasone (FLONASE) 50 MCG/ACT nasal spray Place 2 sprays into both nostrils daily.   . fluticasone  furoate-vilanterol (BREO ELLIPTA) 100-25 MCG/INH AEPB Inhale 1 puff into the lungs daily.   Marland Kitchen ipratropium-albuterol (DUONEB) 0.5-2.5 (3) MG/3ML SOLN Take 3 mLs by nebulization 3 (three) times daily.   Marland Kitchen lactulose (CHRONULAC) 10 GM/15ML solution Take 30 g by mouth 2 (two) times daily as needed.  Marland Kitchen losartan (COZAAR) 25 MG tablet Take  25 mg by mouth daily as needed. Take 1 tablet PRN if BP is >150/90   . Nutritional Supplements (ENSURE ENLIVE PO) Take 1 Bottle by mouth 2 (two) times daily between meals.  . nystatin-triamcinolone (MYCOLOG II) cream Apply 1 application topically 2 (two) times daily. apply nystatin cream under the right breast to treat yeast infection/irritation  . pantoprazole (PROTONIX) 40 MG tablet Take 40 mg by mouth daily. before a meal to help with esophagus problems   . polyethylene glycol (MIRALAX / GLYCOLAX) packet Take 17 g by mouth daily.  . Probiotic Product (RISA-BID PROBIOTIC) TABS Take 1 tablet by mouth 2 (two) times daily.  Marland Kitchen trimethoprim (TRIMPEX) 100 MG tablet Take 100 mg by mouth daily.   . vitamin C (ASCORBIC ACID) 500 MG tablet Take 500 mg by mouth 2 (two) times daily. Potentiate iron absorption (anemia   No facility-administered encounter medications on file as of 11/08/2017.      SIGNIFICANT DIAGNOSTIC EXAMS  LABS REVIEWED TODAY:   10-01-17: wbc 11.7; hgb 12.5; hct 37.7; mcv 85.2; plt 321 10-22-17: glucose 143; bun 22; creat 0.88; k+ 4.4; na++ 132; ca 8.7   Review of Systems  Constitutional: Negative for malaise/fatigue.  Respiratory: Negative for cough and shortness of breath.   Cardiovascular: Negative for chest pain, palpitations and leg swelling.  Gastrointestinal: Negative for abdominal pain, constipation and heartburn.  Musculoskeletal: Negative for back pain, joint pain and myalgias.       Left fourth finger joint stiffness   Skin: Negative.   Neurological: Negative for dizziness.  Psychiatric/Behavioral: The patient is not nervous/anxious.      Physical Exam  Constitutional: She is oriented to person, place, and time. She appears well-developed and well-nourished. No distress.  Neck: No thyromegaly present.  Cardiovascular: Normal rate, regular rhythm, normal heart sounds and intact distal pulses.  Pulmonary/Chest: Effort normal and breath sounds normal. No respiratory distress.  Abdominal: Soft. Bowel sounds are normal. She exhibits no distension. There is no tenderness.  Musculoskeletal: Normal range of motion. She exhibits no edema.  Left 4th finger dupuytren contracture   Lymphadenopathy:    She has no cervical adenopathy.  Neurological: She is alert and oriented to person, place, and time.  Skin: Skin is warm and dry. She is not diaphoretic.  Psychiatric: She has a normal mood and affect.      ASSESSMENT/ PLAN:  TODAY;   1. Essential benign hypertension: stable b/p 115/75 will continue cozaar 25 mg daily for b/p >150/90  2. Asthma with COPD: is stable will continue breo inhaler daily; duoneb three times daily and flonase daily   3. Reflux esophagitis: is status post GIB (08/2016) is stable will continue protonix 40 mg daily   4. Hypertensive kidney disease with CKD stage III: is stable bun 22; creat 0.88  5. Frequent UTI: is stable no recent infection will continue cranberry daily and trimpex 100 mg daily   6. Primary osteoarthritis right knee: is stable will continue voltaren gel 4 gm to right knee four times daily will have therapy evaluate and treat left fourth finger joint  Will stop vit C     MD is aware of resident's narcotic use and is in agreement with current plan of care. We will attempt to wean resident as apropriate   Ok Edwards NP [facility]  Contact [phone] Monday through Friday 8am- 5pm  After hours call [phone]

## 2017-11-20 DIAGNOSIS — N390 Urinary tract infection, site not specified: Secondary | ICD-10-CM | POA: Insufficient documentation

## 2017-11-20 DIAGNOSIS — M1711 Unilateral primary osteoarthritis, right knee: Secondary | ICD-10-CM | POA: Insufficient documentation

## 2017-11-20 DIAGNOSIS — I10 Essential (primary) hypertension: Secondary | ICD-10-CM | POA: Insufficient documentation

## 2017-11-24 ENCOUNTER — Non-Acute Institutional Stay (SKILLED_NURSING_FACILITY): Payer: Medicare Other | Admitting: Adult Health

## 2017-11-24 ENCOUNTER — Other Ambulatory Visit
Admission: RE | Admit: 2017-11-24 | Discharge: 2017-11-24 | Disposition: A | Discharge status: Home / Self Care | Payer: Medicare Other | Source: Ambulatory Visit | Attending: Internal Medicine | Admitting: Internal Medicine

## 2017-11-24 ENCOUNTER — Encounter: Payer: Self-pay | Admitting: Adult Health

## 2017-11-24 DIAGNOSIS — K2970 Gastritis, unspecified, without bleeding: Secondary | ICD-10-CM

## 2017-11-24 DIAGNOSIS — N189 Chronic kidney disease, unspecified: Secondary | ICD-10-CM | POA: Diagnosis present

## 2017-11-24 LAB — URINALYSIS, ROUTINE W REFLEX MICROSCOPIC
Bilirubin Urine: NEGATIVE
GLUCOSE, UA: NEGATIVE mg/dL
HGB URINE DIPSTICK: NEGATIVE
Ketones, ur: NEGATIVE mg/dL
Leukocytes, UA: NEGATIVE
Nitrite: NEGATIVE
Protein, ur: NEGATIVE mg/dL
SPECIFIC GRAVITY, URINE: 1.018 (ref 1.005–1.030)
pH: 5 (ref 5.0–8.0)

## 2017-11-24 LAB — COMPREHENSIVE METABOLIC PANEL
ALT: 9 U/L (ref 0–44)
AST: 21 U/L (ref 15–41)
Albumin: 2.9 g/dL — ABNORMAL LOW (ref 3.5–5.0)
Alkaline Phosphatase: 60 U/L (ref 38–126)
Anion gap: 9 (ref 5–15)
BILIRUBIN TOTAL: 0.5 mg/dL (ref 0.3–1.2)
BUN: 26 mg/dL — AB (ref 8–23)
CHLORIDE: 99 mmol/L (ref 98–111)
CO2: 28 mmol/L (ref 22–32)
Calcium: 8.6 mg/dL — ABNORMAL LOW (ref 8.9–10.3)
Creatinine, Ser: 0.97 mg/dL (ref 0.44–1.00)
GFR calc Af Amer: 57 mL/min — ABNORMAL LOW (ref >60)
GFR, EST NON AFRICAN AMERICAN: 49 mL/min — AB (ref >60)
GLUCOSE: 117 mg/dL — AB (ref 70–99)
POTASSIUM: 4.4 mmol/L (ref 3.5–5.1)
Sodium: 136 mmol/L (ref 135–145)
TOTAL PROTEIN: 5.7 g/dL — AB (ref 6.5–8.1)

## 2017-11-24 LAB — CBC WITH DIFFERENTIAL/PLATELET
BASOS ABS: 0 10*3/uL (ref 0–0.1)
Basophils Relative: 0 %
Eosinophils Absolute: 0.1 10*3/uL (ref 0–0.7)
Eosinophils Relative: 1 %
HEMATOCRIT: 33.8 % — AB (ref 35.0–47.0)
HEMOGLOBIN: 11.5 g/dL — AB (ref 12.0–16.0)
Lymphocytes Relative: 6 %
Lymphs Abs: 0.8 10*3/uL — ABNORMAL LOW (ref 1.0–3.6)
MCH: 29.1 pg (ref 26.0–34.0)
MCHC: 33.9 g/dL (ref 32.0–36.0)
MCV: 85.8 fL (ref 80.0–100.0)
MONO ABS: 1 10*3/uL — AB (ref 0.2–0.9)
MONOS PCT: 8 %
NEUTROS ABS: 10.5 10*3/uL — AB (ref 1.4–6.5)
Neutrophils Relative %: 85 %
Platelets: 431 10*3/uL (ref 150–440)
RBC: 3.94 MIL/uL (ref 3.80–5.20)
RDW: 15.8 % — ABNORMAL HIGH (ref 11.5–14.5)
WBC: 12.5 10*3/uL — ABNORMAL HIGH (ref 3.6–11.0)

## 2017-11-24 NOTE — Progress Notes (Signed)
[facility]   [facility] Room Number: [place] of Service:  SNF (31)    CODE STATUS: DNR  No Known Allergies  Chief Complaint  Patient presents with  . Acute Visit    Constipation    HPI:  Staff reports that for the past 48 hours she has had nausea and vomiting. Her KUB did not demonstrate any acute process. There are no reports of fever present. She is tolerating liquids. She is not having any diarrheal stools.    Past Medical History:  Diagnosis Date  . Allergic rhinitis 11/18/2013  . Asthma with COPD (chronic obstructive pulmonary disease) ([place]) 11/18/2013   Last Assessment & Plan:  No flair ups noted recently and breathing is essentially at baseline.    . Asthma without status asthmaticus    unspecified  . Borderline hypothyroidism 11/18/2013   Last Assessment & Plan:  Energy and tsh are doing well  . Cancer [facility])    colon cancer- surgically removed in 2006  . Cholelithiases   . Chronic kidney disease   . Colon cancer ([place])   . Complete left bundle branch block (LBBB) 11/18/2013  . Constipation 11/18/2013  . Diverticulosis   . Hiatal hernia    right  . HLD (hyperlipidemia) 11/18/2013   Last Assessment & Plan:  Appropriate diet is followed and no myalgia's are noted.    . Hyperlipidemia, unspecified   . Hypertensive kidney disease with CKD stage III ([place]) 11/18/2013   Last Assessment & Plan:  Is compliant with hypertensive medications without clear side effects or lack of control.  Nausea and itching are not symptomatic and nsaids are being avoided.    . Melanoma ([place])   . Osteopenia     Past Surgical History:  Procedure Laterality Date  . ABDOMINAL HYSTERECTOMY    . Bladder Lift    . BREAST EXCISIONAL BIOPSY Left 15+ yrs ago   neg  . COLON RESECTION  2006   colon cancer  . COLON SURGERY    . COLON SURGERY Right   . COLONOSCOPY WITH PROPOFOL N/A 06/01/2017   Procedure: COLONOSCOPY WITH PROPOFOL;  Surgeon: [name], MD;  Location:  [facility] ENDOSCOPY;  Service: Endoscopy;  Laterality: N/A;  . ESOPHAGOGASTRODUODENOSCOPY (EGD) WITH PROPOFOL N/A 06/01/2017   Procedure: ESOPHAGOGASTRODUODENOSCOPY (EGD) WITH PROPOFOL;  Surgeon: [name], MD;  Location: [facility] ENDOSCOPY;  Service: Endoscopy;  Laterality: N/A;  . HEMICOLECTOMY Right   . SKIN CANCER EXCISION  2006   melanoma    Social History   Socioeconomic History  . Marital status: Widowed    Spouse name: Not on file  . Number of children: Not on file  . Years of education: 73  . Highest education level: Bachelor's degree (e.g., BA, AB, BS)  Occupational History  . Not on file  Social Needs  . Financial resource strain: Not on file  . Food insecurity:    Worry: Not on file    Inability: Not on file  . Transportation needs:    Medical: Not on file    Non-medical: Not on file  Tobacco Use  . Smoking status: Never Smoker  . Smokeless tobacco: Never Used  Substance and Sexual Activity  . Alcohol use: No  . Drug use: Never  . Sexual activity: Not on file  Lifestyle  . Physical activity:    Days per week: Not on file    Minutes per session: Not on file  . Stress: Not on file  Relationships  .  Social connections:    Talks on phone: Not on file    Gets together: Not on file    Attends religious service: Not on file    Active member of club or organization: Not on file    Attends meetings of clubs or organizations: Not on file    Relationship status: Not on file  . Intimate partner violence:    Fear of current or ex partner: Not on file    Emotionally abused: Not on file    Physically abused: Not on file    Forced sexual activity: Not on file  Other Topics Concern  . Not on file  Social History Narrative   From assisted living facility, however after recent discharge from the hospital-discharge to short-term rehab   -Most transfers all through wheelchair      Non smoker   No smokeless tobacco use   No alcohol use   Widowed   DNR with HCPOA and Living  Will    Family History  Problem Relation Age of Onset  . Breast cancer Daughter 28       passed at 61 from breast cancer  . Heart disease Mother   . Heart failure Mother   . Lung cancer Father     VITAL SIGNS BP (!) 158/76   Pulse 74   Temp 97.7 F (36.5 C)   Resp 18   Ht 5' (1.524 m)   Wt 157 lb 14.4 oz (71.6 kg)   SpO2 97%   BMI 30.84 kg/m   Patient's Medications  New Prescriptions   No medications on file  Previous Medications   ACETAMINOPHEN (TYLENOL) 325 MG TABLET    Take 650 mg by mouth every 4 (four) hours as needed. for pain/ increased temp. May be administered orally, per G-tube if needed or rectally if unable to swallow (separate order). Maximum dose for 24 hours is 3,000 mg from all sources of Acetaminophen/ Tylenol   AMINO ACIDS-PROTEIN HYDROLYS (FEEDING SUPPLEMENT, PRO-STAT SUGAR FREE 64,) LIQD    Take 30 mLs by mouth 2 (two) times daily between meals. low protein, low albumin   BISACODYL (DULCOLAX) 10 MG SUPPOSITORY    Place 10 mg rectally 2 (two) times daily as needed.   CALCIUM CARBONATE (TUMS EX) 750 MG CHEWABLE TABLET    Chew 2 tablets by mouth daily.    CAMPHOR-MENTHOL-METHYL SAL 06-09-28 % CREA    Apply 1 application topically 3 (three) times daily as needed. Apply thin film of topical to pain site. Okay to leave on bed side.   CRANBERRY (CRANBERRY CONCENTRATE) 500 MG CAPS    Take 1 capsule by mouth daily.    DICLOFENAC SODIUM (VOLTAREN) 1 % GEL    Apply 4 g topically 4 (four) times daily. Apply 4 grams to the right knee QID for pain,   DOCUSATE SODIUM (COLACE) 100 MG CAPSULE    Take 1 capsule (100 mg total) by mouth daily as needed.   FLUTICASONE (FLONASE) 50 MCG/ACT NASAL SPRAY    Place 2 sprays into both nostrils daily.    FLUTICASONE FUROATE-VILANTEROL (BREO ELLIPTA) 100-25 MCG/INH AEPB    Inhale 1 puff into the lungs daily.    IPRATROPIUM-ALBUTEROL (DUONEB) 0.5-2.5 (3) MG/3ML SOLN    Take 3 mLs by nebulization 3 (three) times daily.    LACTULOSE  (CHRONULAC) 10 GM/15ML SOLUTION    Take 30 g by mouth 2 (two) times daily as needed.   LOSARTAN (COZAAR) 25 MG TABLET    Take 25  mg by mouth daily as needed. Take 1 tablet PRN if BP is >150/90    NON FORMULARY    Diet Type - Regular   NUTRITIONAL SUPPLEMENTS (ENSURE ENLIVE PO)    Take 1 Bottle by mouth daily.    NYSTATIN-TRIAMCINOLONE (MYCOLOG II) CREAM    Apply 1 application topically 2 (two) times daily. apply nystatin cream under the right breast to treat yeast infection/irritation   ONDANSETRON (ZOFRAN) 4 MG TABLET    Take 4 mg by mouth every 6 (six) hours as needed for nausea or vomiting.   PANTOPRAZOLE (PROTONIX) 40 MG TABLET    Take 40 mg by mouth daily. before a meal to help with esophagus problems    POLYETHYLENE GLYCOL (MIRALAX / GLYCOLAX) PACKET    Take 17 g by mouth daily.   PROBIOTIC PRODUCT (RISA-BID PROBIOTIC) TABS    Take 1 tablet by mouth 2 (two) times daily.   SODIUM PHOSPHATE (FLEET) 7-19 GM/118ML ENEM    Place 1 enema rectally. Once every 3 days as needed   TRIMETHOPRIM (TRIMPEX) 100 MG TABLET    Take 100 mg by mouth daily.   Modified Medications   No medications on file  Discontinued Medications   CHOLECALCIFEROL (VITAMIN D-1000 MAX ST) 1000 UNITS TABLET    Take 2,000 Units by mouth daily.    CYANOCOBALAMIN 1000 MCG TABLET    Take 2,000 mcg by mouth daily.    VITAMIN C (ASCORBIC ACID) 500 MG TABLET    Take 500 mg by mouth 2 (two) times daily. Potentiate iron absorption (anemia     SIGNIFICANT DIAGNOSTIC EXAMS  TODAY;  11-24-17: KUB: no specific bowel gas pattern with no definite acute intra-abdominal pathology.    LABS REVIEWED TODAY:   10-01-17: wbc 11.7; hgb 12.5; hct 37.7; mcv 85.2; plt 321 10-22-17: glucose 143; bun 22; creat 0.88; k+ 4.4; na++ 132; ca 8.7  NO NEW LABS.   Review of Systems  Constitutional: Positive for malaise/fatigue. Negative for fever.  Respiratory: Negative for cough and shortness of breath.   Cardiovascular: Negative for chest pain,  palpitations and leg swelling.  Gastrointestinal: Positive for nausea and vomiting. Negative for abdominal pain, constipation and heartburn.  Musculoskeletal: Negative for back pain, joint pain and myalgias.  Skin: Negative.   Neurological: Negative for dizziness.  Psychiatric/Behavioral: The patient is not nervous/anxious.    Physical Exam  Constitutional: She is oriented to person, place, and time. She appears well-developed and well-nourished. No distress.  Neck: No thyromegaly present.  Cardiovascular: Normal rate, regular rhythm, normal heart sounds and intact distal pulses.  Pulmonary/Chest: Effort normal and breath sounds normal. No respiratory distress.  Abdominal: Soft. Bowel sounds are normal. She exhibits distension. There is no tenderness.  Has mild distention present.  Bowel sounds present in all 4 quads.   Musculoskeletal: She exhibits no edema.  Is able to move all extremities   Lymphadenopathy:    She has no cervical adenopathy.  Neurological: She is alert and oriented to person, place, and time.  Skin: Skin is warm and dry. She is not diaphoretic.  Psychiatric: She has a normal mood and affect.    ASSESSMENT/ PLAN:  TODAY;   1. Viral gastritis: will continue with po fluids and will diet as tolerated. She did tell me that she is starting to feel slightly better.     Ok Edwards NP [facility]  Contact [phone] Monday through Friday 8am- 5pm  After hours call [phone]

## 2017-11-25 LAB — URINE CULTURE: CULTURE: NO GROWTH

## 2017-11-26 ENCOUNTER — Non-Acute Institutional Stay (SKILLED_NURSING_FACILITY): Payer: Medicare Other | Admitting: Adult Health

## 2017-11-26 ENCOUNTER — Encounter: Payer: Self-pay | Admitting: Adult Health

## 2017-11-26 DIAGNOSIS — G9340 Encephalopathy, unspecified: Secondary | ICD-10-CM | POA: Diagnosis not present

## 2017-11-26 NOTE — Progress Notes (Signed)
[facility]  [facility] Room Number: 937J Place of Service:  SNF [phone]) Provider:  Durenda Age, NP  Patient Care Team: Kirk Ruths, MD as PCP - General (Internal Medicine)  Extended Emergency Contact Information Primary Emergency Contact: Dondra Spry [place] of [place] Phone: [phone] Mobile Phone: [phone] Relation: Daughter Secondary Emergency Contact: Costella Hatcher [place] Phone: [phone] Relation: Son  Code Status:  DNR   Goals of care: Advanced Directive information Advanced Directives 11/26/2017  Does Patient Have a Medical Advance Directive? Yes  Type of Paramedic of [place];Out of facility DNR (pink MOST or yellow form);Living will  Does patient want to make changes to medical advance directive? No - Patient declined  Copy of [place] in Chart? Yes  Would patient like information on creating a medical advance directive? -  Pre-existing out of facility DNR order (yellow form or pink MOST form) Yellow form placed in chart (order not valid for inpatient use)     Chief Complaint  Patient presents with  . Acute Visit    Agitation    HPI:  Pt is a 84 y.o. female seen today for agitation./confusion. She was seen in the room today with daughter at bedside.  She has PMH of CKD, COPD, allergic rhinitis and cholelithiasis.  No S OB has been reported.   Past Medical History:  Diagnosis Date  . Allergic rhinitis 11/18/2013  . Asthma with COPD (chronic obstructive pulmonary disease) ([place]) 11/18/2013   Last Assessment & Plan:  No flair ups noted recently and breathing is essentially at baseline.    . Asthma without status asthmaticus    unspecified  . Borderline hypothyroidism 11/18/2013   Last Assessment & Plan:  Energy and tsh are doing well  . Cancer [facility])    colon cancer- surgically removed in 2006  . Cholelithiases   . Chronic kidney  disease   . Colon cancer ([place])   . Complete left bundle branch block (LBBB) 11/18/2013  . Constipation 11/18/2013  . Diverticulosis   . Hiatal hernia    right  . HLD (hyperlipidemia) 11/18/2013   Last Assessment & Plan:  Appropriate diet is followed and no myalgia's are noted.    . Hyperlipidemia, unspecified   . Hypertensive kidney disease with CKD stage III ([place]) 11/18/2013   Last Assessment & Plan:  Is compliant with hypertensive medications without clear side effects or lack of control.  Nausea and itching are not symptomatic and nsaids are being avoided.    . Melanoma ([place])   . Osteopenia    Past Surgical History:  Procedure Laterality Date  . ABDOMINAL HYSTERECTOMY    . Bladder Lift    . BREAST EXCISIONAL BIOPSY Left 15+ yrs ago   neg  . COLON RESECTION  2006   colon cancer  . COLON SURGERY    . COLON SURGERY Right   . COLONOSCOPY WITH PROPOFOL N/A 06/01/2017   Procedure: COLONOSCOPY WITH PROPOFOL;  Surgeon: [name], MD;  Location: [facility] ENDOSCOPY;  Service: Endoscopy;  Laterality: N/A;  . ESOPHAGOGASTRODUODENOSCOPY (EGD) WITH PROPOFOL N/A 06/01/2017   Procedure: ESOPHAGOGASTRODUODENOSCOPY (EGD) WITH PROPOFOL;  Surgeon: [name], MD;  Location: [facility] ENDOSCOPY;  Service: Endoscopy;  Laterality: N/A;  . HEMICOLECTOMY Right   . SKIN CANCER EXCISION  2006   melanoma    No Known Allergies    Review of Systems  GENERAL: No change in appetite, no fatigue, no weight changes, no fever,  chills or weakness MOUTH and THROAT: Denies oral discomfort, gingival pain or bleeding RESPIRATORY: no cough, SOB, DOE, wheezing, hemoptysis CARDIAC: No chest pain, edema or palpitations GI: No abdominal pain, diarrhea, constipation, heart burn, nausea or vomiting GU: Denies dysuria, frequency, hematuria, incontinence, or discharge PSYCHIATRIC: Denies feelings of depression or anxiety. No report of hallucinations, insomnia, paranoia, or agitation   Immunization History  Administered  Date(s) Administered  . PPD Test 09/09/2016, 05/30/2017  . Pneumococcal Conjugate-13 12/16/2016  . Pneumococcal-Unspecified 11/28/2008   Pertinent  Health Maintenance Due  Topic Date Due  . INFLUENZA VACCINE  01/24/2018 (Originally 09/30/2017)  . PNA vac Low Risk Adult  Completed  . DEXA SCAN  Discontinued       Vitals:   11/26/17 1131  BP: 113/74  Pulse: 81  Resp: 18  Temp: 97.9 F (36.6 C)  TempSrc: Oral  SpO2: 97%  Weight: 152 lb (68.9 kg)  Height: 5' (1.524 m)   Body mass index is 29.69 kg/m.  Physical Exam  GENERAL APPEARANCE: Well nourished. In no acute distress. Normal body habitus SKIN:  Skin is warm and dry.  MOUTH and THROAT: Lips are without lesions. Oral mucosa is moist and without lesions. Tongue is normal in shape, size, and color and without lesions RESPIRATORY: Breathing is even & unlabored, BS CTAB CARDIAC: RRR, no murmur,no extra heart sounds, no edema GI: Abdomen soft, normal BS, no masses, no tenderness EXTREMITIES:  Able to move X 4 extremities PSYCHIATRIC: Alert to self, disoriented to time and place. Affect and behavior are appropriate   Labs reviewed: Recent Labs    04/20/17 1505  09/15/17 1441 10/22/17 1330 11/24/17 0600  NA 133*   < > 134* 132* 136  K 3.6   < > 4.2 4.4 4.4  CL 93*   < > 99 98 99  CO2 29   < > 26 25 28   GLUCOSE 116*   < > 128* 143* 117*  BUN 39*   < > 28* 22 26*  CREATININE 0.93   < > 1.05* 0.88 0.97  CALCIUM 10.0   < > 8.8* 8.7* 8.6*  MG 1.8  --   --   --   --    < > = values in this interval not displayed.   Recent Labs    06/21/17 1542 06/27/17 1013 11/24/17 0600  AST 24 19 21   ALT 10* 11* 9  ALKPHOS 72 60 60  BILITOT 0.2* 0.7 0.5  PROT 5.7* 5.2* 5.7*  ALBUMIN 3.0* 2.8* 2.9*   Recent Labs    09/15/17 1441 10/01/17 0600 11/24/17 0600  WBC 13.2* 11.7* 12.5*  NEUTROABS 10.1* 8.7* 10.5*  HGB 12.9 12.5 11.5*  HCT 38.5 37.7 33.8*  MCV 85.1 85.2 85.8  PLT 363 321 431   Lab Results  Component Value  Date   TSH 3.269 04/20/2017    Lab Results  Component Value Date   CHOL 127 04/20/2017   HDL 74 04/20/2017   LDLCALC 40 04/20/2017   TRIG 66 04/20/2017   CHOLHDL 1.7 04/20/2017     Assessment/Plan  1. Encephalopathy - she has periods of confusion, will order chest x-ray to rule out pneumonia    Family/ staff Communication: Discussed plan of care with daughter.  Labs/tests ordered:  Chest x-ray  Goals of care:   Long Term/Palliative Care   Durenda Age, NP [facility] [phone] (Monday-Friday 8:00 a.m. - 5:00 p.m.) [phone] (after hours)

## 2017-11-30 ENCOUNTER — Encounter
Admission: RE | Admit: 2017-11-30 | Discharge: 2017-11-30 | Disposition: A | Discharge status: Home / Self Care | Payer: Medicare Other | Source: Ambulatory Visit | Attending: Internal Medicine | Admitting: Internal Medicine

## 2017-12-06 ENCOUNTER — Other Ambulatory Visit: Payer: Self-pay

## 2017-12-06 ENCOUNTER — Emergency Department: Payer: Medicare Other

## 2017-12-06 ENCOUNTER — Inpatient Hospital Stay
Admission: EM | Admit: 2017-12-06 | Discharge: 2017-12-13 | DRG: 389 | Disposition: A | Discharge status: Skilled Nursing Facility | Payer: Medicare Other | Source: Skilled Nursing Facility | Attending: Surgery | Admitting: Surgery

## 2017-12-06 ENCOUNTER — Inpatient Hospital Stay: Payer: Medicare Other

## 2017-12-06 ENCOUNTER — Encounter: Payer: Self-pay | Admitting: Emergency Medicine

## 2017-12-06 DIAGNOSIS — E039 Hypothyroidism, unspecified: Secondary | ICD-10-CM | POA: Diagnosis present

## 2017-12-06 DIAGNOSIS — Z803 Family history of malignant neoplasm of breast: Secondary | ICD-10-CM

## 2017-12-06 DIAGNOSIS — Z23 Encounter for immunization: Secondary | ICD-10-CM

## 2017-12-06 DIAGNOSIS — Z515 Encounter for palliative care: Secondary | ICD-10-CM | POA: Diagnosis not present

## 2017-12-06 DIAGNOSIS — Z85038 Personal history of other malignant neoplasm of large intestine: Secondary | ICD-10-CM

## 2017-12-06 DIAGNOSIS — Z8249 Family history of ischemic heart disease and other diseases of the circulatory system: Secondary | ICD-10-CM

## 2017-12-06 DIAGNOSIS — R140 Abdominal distension (gaseous): Secondary | ICD-10-CM

## 2017-12-06 DIAGNOSIS — Z801 Family history of malignant neoplasm of trachea, bronchus and lung: Secondary | ICD-10-CM

## 2017-12-06 DIAGNOSIS — K8020 Calculus of gallbladder without cholecystitis without obstruction: Secondary | ICD-10-CM | POA: Diagnosis present

## 2017-12-06 DIAGNOSIS — I129 Hypertensive chronic kidney disease with stage 1 through stage 4 chronic kidney disease, or unspecified chronic kidney disease: Secondary | ICD-10-CM | POA: Diagnosis present

## 2017-12-06 DIAGNOSIS — Z66 Do not resuscitate: Secondary | ICD-10-CM | POA: Diagnosis present

## 2017-12-06 DIAGNOSIS — F0390 Unspecified dementia without behavioral disturbance: Secondary | ICD-10-CM | POA: Diagnosis present

## 2017-12-06 DIAGNOSIS — N183 Chronic kidney disease, stage 3 (moderate): Secondary | ICD-10-CM | POA: Diagnosis present

## 2017-12-06 DIAGNOSIS — K432 Incisional hernia without obstruction or gangrene: Secondary | ICD-10-CM | POA: Diagnosis present

## 2017-12-06 DIAGNOSIS — M8580 Other specified disorders of bone density and structure, unspecified site: Secondary | ICD-10-CM | POA: Diagnosis present

## 2017-12-06 DIAGNOSIS — Z7951 Long term (current) use of inhaled steroids: Secondary | ICD-10-CM

## 2017-12-06 DIAGNOSIS — N3289 Other specified disorders of bladder: Secondary | ICD-10-CM | POA: Diagnosis present

## 2017-12-06 DIAGNOSIS — I700 Atherosclerosis of aorta: Secondary | ICD-10-CM | POA: Diagnosis present

## 2017-12-06 DIAGNOSIS — K460 Unspecified abdominal hernia with obstruction, without gangrene: Secondary | ICD-10-CM

## 2017-12-06 DIAGNOSIS — Z4659 Encounter for fitting and adjustment of other gastrointestinal appliance and device: Secondary | ICD-10-CM

## 2017-12-06 DIAGNOSIS — J449 Chronic obstructive pulmonary disease, unspecified: Secondary | ICD-10-CM | POA: Diagnosis present

## 2017-12-06 DIAGNOSIS — K5652 Intestinal adhesions [bands] with complete obstruction: Secondary | ICD-10-CM | POA: Diagnosis present

## 2017-12-06 DIAGNOSIS — Z85820 Personal history of malignant melanoma of skin: Secondary | ICD-10-CM

## 2017-12-06 DIAGNOSIS — K56609 Unspecified intestinal obstruction, unspecified as to partial versus complete obstruction: Secondary | ICD-10-CM

## 2017-12-06 DIAGNOSIS — Z9049 Acquired absence of other specified parts of digestive tract: Secondary | ICD-10-CM

## 2017-12-06 DIAGNOSIS — E876 Hypokalemia: Secondary | ICD-10-CM | POA: Diagnosis not present

## 2017-12-06 DIAGNOSIS — B9689 Other specified bacterial agents as the cause of diseases classified elsewhere: Secondary | ICD-10-CM | POA: Diagnosis present

## 2017-12-06 DIAGNOSIS — K5730 Diverticulosis of large intestine without perforation or abscess without bleeding: Secondary | ICD-10-CM | POA: Diagnosis present

## 2017-12-06 DIAGNOSIS — E785 Hyperlipidemia, unspecified: Secondary | ICD-10-CM | POA: Diagnosis present

## 2017-12-06 DIAGNOSIS — Z7189 Other specified counseling: Secondary | ICD-10-CM | POA: Diagnosis not present

## 2017-12-06 DIAGNOSIS — Z79899 Other long term (current) drug therapy: Secondary | ICD-10-CM | POA: Diagnosis not present

## 2017-12-06 DIAGNOSIS — K219 Gastro-esophageal reflux disease without esophagitis: Secondary | ICD-10-CM | POA: Diagnosis present

## 2017-12-06 DIAGNOSIS — N390 Urinary tract infection, site not specified: Secondary | ICD-10-CM | POA: Diagnosis present

## 2017-12-06 DIAGNOSIS — Z90710 Acquired absence of both cervix and uterus: Secondary | ICD-10-CM

## 2017-12-06 DIAGNOSIS — Z87440 Personal history of urinary (tract) infections: Secondary | ICD-10-CM

## 2017-12-06 LAB — URINALYSIS, COMPLETE (UACMP) WITH MICROSCOPIC
BILIRUBIN URINE: NEGATIVE
Glucose, UA: NEGATIVE mg/dL
Hgb urine dipstick: NEGATIVE
Ketones, ur: NEGATIVE mg/dL
Nitrite: NEGATIVE
Protein, ur: 30 mg/dL — AB
SPECIFIC GRAVITY, URINE: 1.017 (ref 1.005–1.030)
pH: 6 (ref 5.0–8.0)

## 2017-12-06 LAB — LIPASE, BLOOD: LIPASE: 28 U/L (ref 11–51)

## 2017-12-06 LAB — CBC
HEMATOCRIT: 37.2 % (ref 35.0–47.0)
Hemoglobin: 11.9 g/dL — ABNORMAL LOW (ref 12.0–16.0)
MCH: 27.5 pg (ref 26.0–34.0)
MCHC: 32.1 g/dL (ref 32.0–36.0)
MCV: 85.4 fL (ref 80.0–100.0)
Platelets: 527 10*3/uL — ABNORMAL HIGH (ref 150–440)
RBC: 4.35 MIL/uL (ref 3.80–5.20)
RDW: 15.9 % — AB (ref 11.5–14.5)
WBC: 32.7 10*3/uL — ABNORMAL HIGH (ref 3.6–11.0)

## 2017-12-06 LAB — LACTIC ACID, PLASMA
Lactic Acid, Venous: 1.5 mmol/L (ref 0.5–1.9)
Lactic Acid, Venous: 1.4 mmol/L (ref 0.5–1.9)

## 2017-12-06 LAB — COMPREHENSIVE METABOLIC PANEL
ALT: 8 U/L (ref 0–44)
AST: 30 U/L (ref 15–41)
Albumin: 3.2 g/dL — ABNORMAL LOW (ref 3.5–5.0)
Alkaline Phosphatase: 65 U/L (ref 38–126)
Anion gap: 9 (ref 5–15)
BUN: 21 mg/dL (ref 8–23)
CHLORIDE: 95 mmol/L — AB (ref 98–111)
CO2: 29 mmol/L (ref 22–32)
Calcium: 8.9 mg/dL (ref 8.9–10.3)
Creatinine, Ser: 0.84 mg/dL (ref 0.44–1.00)
GFR calc Af Amer: >60 mL/min (ref >60)
GFR calc non Af Amer: 58 mL/min — ABNORMAL LOW (ref >60)
GLUCOSE: 165 mg/dL — AB (ref 70–99)
POTASSIUM: 5.2 mmol/L — AB (ref 3.5–5.1)
Sodium: 133 mmol/L — ABNORMAL LOW (ref 135–145)
Total Bilirubin: 1.2 mg/dL (ref 0.3–1.2)
Total Protein: 6.6 g/dL (ref 6.5–8.1)

## 2017-12-06 LAB — TROPONIN I

## 2017-12-06 LAB — MRSA PCR SCREENING: MRSA by PCR: NEGATIVE

## 2017-12-06 MED ORDER — PIPERACILLIN-TAZOBACTAM 3.375 G IVPB
3.3750 g | Freq: Three times a day (TID) | INTRAVENOUS | Status: DC
  Administered 2017-12-06 – 2017-12-13 (×21): 3.375 g via INTRAVENOUS
  Filled 2017-12-06 (×21): qty 50

## 2017-12-06 MED ORDER — DEXTROSE IN LACTATED RINGERS 5 % IV SOLN
INTRAVENOUS | Status: DC
  Administered 2017-12-06 – 2017-12-09 (×6): via INTRAVENOUS

## 2017-12-06 MED ORDER — HEPARIN SODIUM (PORCINE) 5000 UNIT/ML IJ SOLN
5000.0000 [IU] | Freq: Three times a day (TID) | INTRAMUSCULAR | Status: DC
  Administered 2017-12-06 – 2017-12-13 (×21): 5000 [IU] via SUBCUTANEOUS
  Filled 2017-12-06 (×21): qty 1

## 2017-12-06 MED ORDER — SORBITOL 70 % SOLN
960.0000 mL | TOPICAL_OIL | Freq: Once | ORAL | Status: AC
  Administered 2017-12-06: 960 mL via RECTAL
  Filled 2017-12-06: qty 473

## 2017-12-06 MED ORDER — FAMOTIDINE IN NACL 20-0.9 MG/50ML-% IV SOLN
20.0000 mg | Freq: Two times a day (BID) | INTRAVENOUS | Status: DC
  Administered 2017-12-06 – 2017-12-11 (×11): 20 mg via INTRAVENOUS
  Filled 2017-12-06 (×11): qty 50

## 2017-12-06 MED ORDER — IOHEXOL 300 MG/ML  SOLN
75.0000 mL | Freq: Once | INTRAMUSCULAR | Status: AC | PRN
  Administered 2017-12-06: 75 mL via INTRAVENOUS

## 2017-12-06 NOTE — ED Provider Notes (Signed)
[facility] Emergency Department Provider Note   ____________________________________________    I have reviewed the triage vital signs and the nursing notes.   HISTORY  Chief Complaint Emesis     HPI Beth Chen is a 84 y.o. female who presents with complaints of nausea and vomiting.  Patient reports over the last 3 days she has had intermittent nausea and vomiting which seems to have been getting worse.  She denies abdominal pain.  Denies cough, does not believe that she has had any fevers or chills.  Denies dysuria but does report frequent urinary tract infections.  No diarrhea.  No rash noted.  Has not taken anything for this.  Past Medical History:  Diagnosis Date  . Allergic rhinitis 11/18/2013  . Asthma with COPD (chronic obstructive pulmonary disease) ([place]) 11/18/2013   Last Assessment & Plan:  No flair ups noted recently and breathing is essentially at baseline.    . Asthma without status asthmaticus    unspecified  . Borderline hypothyroidism 11/18/2013   Last Assessment & Plan:  Energy and tsh are doing well  . Cancer [facility])    colon cancer- surgically removed in 2006  . Cholelithiases   . Chronic kidney disease   . Colon cancer ([place])   . Complete left bundle branch block (LBBB) 11/18/2013  . Constipation 11/18/2013  . Diverticulosis   . Hiatal hernia    right  . HLD (hyperlipidemia) 11/18/2013   Last Assessment & Plan:  Appropriate diet is followed and no myalgia's are noted.    . Hyperlipidemia, unspecified   . Hypertensive kidney disease with CKD stage III ([place]) 11/18/2013   Last Assessment & Plan:  Is compliant with hypertensive medications without clear side effects or lack of control.  Nausea and itching are not symptomatic and nsaids are being avoided.    . Melanoma ([place])   . Osteopenia     Patient Active Problem List   Diagnosis Date Noted  . Essential hypertension, benign 11/20/2017  . Frequent UTI 11/20/2017  . Primary  osteoarthritis of right knee 11/20/2017  . Protein-calorie malnutrition ([place]) 08/30/2017  . Reflux esophagitis   . Anemia 05/30/2017  . Generalized weakness 05/20/2017  . Lower GI bleed 09/07/2016  . GI bleed 09/05/2016  . Allergic rhinitis 11/18/2013  . Asthma with COPD (chronic obstructive pulmonary disease) ([place]) 11/18/2013  . Borderline hypothyroidism 11/18/2013  . Complete left bundle branch block (LBBB) 11/18/2013  . Constipation 11/18/2013  . Hypertensive kidney disease with CKD stage III ([place]) 11/18/2013    Past Surgical History:  Procedure Laterality Date  . ABDOMINAL HYSTERECTOMY    . Bladder Lift    . BREAST EXCISIONAL BIOPSY Left 15+ yrs ago   neg  . COLON RESECTION  2006   colon cancer  . COLON SURGERY    . COLON SURGERY Right   . COLONOSCOPY WITH PROPOFOL N/A 06/01/2017   Procedure: COLONOSCOPY WITH PROPOFOL;  Surgeon: [name], MD;  Location: [facility] ENDOSCOPY;  Service: Endoscopy;  Laterality: N/A;  . ESOPHAGOGASTRODUODENOSCOPY (EGD) WITH PROPOFOL N/A 06/01/2017   Procedure: ESOPHAGOGASTRODUODENOSCOPY (EGD) WITH PROPOFOL;  Surgeon: [name], MD;  Location: [facility] ENDOSCOPY;  Service: Endoscopy;  Laterality: N/A;  . HEMICOLECTOMY Right   . SKIN CANCER EXCISION  2006   melanoma    Prior to Admission medications   Medication Sig Start Date End Date Taking? Authorizing Provider  acetaminophen (TYLENOL) 325 MG tablet Take 650 mg by mouth every 4 (four) hours as needed. for pain/  increased temp. May be administered orally, per G-tube if needed or rectally if unable to swallow (separate order). Maximum dose for 24 hours is 3,000 mg from all sources of Acetaminophen/ Tylenol   Yes [provider]  Amino Acids-Protein Hydrolys (FEEDING SUPPLEMENT, PRO-STAT SUGAR FREE 64,) LIQD Take 30 mLs by mouth 2 (two) times daily between meals. low protein, low albumin   Yes [provider]  bisacodyl (DULCOLAX) 10 MG suppository Place 10 mg rectally 2 (two) times  daily as needed.   Yes [provider]  calcium carbonate (TUMS EX) 750 MG chewable tablet Chew 2 tablets by mouth daily.    Yes [provider]  Camphor-Menthol-Methyl Sal 06-09-28 % CREA Apply 1 application topically 3 (three) times daily as needed. Apply thin film of topical to pain site. Okay to leave on bed side.   Yes [provider]  diclofenac sodium (VOLTAREN) 1 % GEL Apply 4 g topically 4 (four) times daily. Apply 4 grams to the right knee QID for pain,   Yes [provider]  docusate sodium (COLACE) 100 MG capsule Take 1 capsule (100 mg total) by mouth daily as needed. 04/14/17 04/14/18 Yes McShane, Gerda Diss, MD  fluticasone (FLONASE) 50 MCG/ACT nasal spray Place 2 sprays into both nostrils daily.  11/19/15  Yes [provider]  fluticasone furoate-vilanterol (BREO ELLIPTA) 100-25 MCG/INH AEPB Inhale 1 puff into the lungs daily.    Yes [provider]  ipratropium-albuterol (DUONEB) 0.5-2.5 (3) MG/3ML SOLN Take 3 mLs by nebulization 3 (three) times daily.    Yes [provider]  lactulose (CHRONULAC) 10 GM/15ML solution Take 30 g by mouth 2 (two) times daily as needed.   Yes [provider]  losartan (COZAAR) 25 MG tablet Take 25 mg by mouth daily as needed (BP >150/90).    Yes [provider]  Nutritional Supplements (ENSURE ENLIVE PO) Take 1 Bottle by mouth daily.    Yes [provider]  nystatin-triamcinolone (MYCOLOG II) cream Apply 1 application topically 2 (two) times daily. apply nystatin cream under the right breast to treat yeast infection/irritation   Yes [provider]  pantoprazole (PROTONIX) 40 MG tablet Take 40 mg by mouth daily. before a meal to help with esophagus problems    Yes [provider]  polyethylene glycol (MIRALAX / GLYCOLAX) packet Take 17 g by mouth daily.   Yes [provider]  Probiotic Product (RISA-BID PROBIOTIC) TABS Take 1 tablet by mouth 2 (two)  times daily.   Yes [provider]  sodium phosphate (FLEET) 7-19 GM/118ML ENEM Place 1 enema rectally every three (3) days as needed for mild constipation or moderate constipation. Once every 3 days as needed    Yes [provider]  trimethoprim (TRIMPEX) 100 MG tablet Take 100 mg by mouth daily.    Yes [provider]  NON FORMULARY Diet Type - Regular    [provider]     Allergies Patient has no known allergies.  Family History  Problem Relation Age of Onset  . Breast cancer Daughter 74       passed at 27 from breast cancer  . Heart disease Mother   . Heart failure Mother   . Lung cancer Father     Social History Social History   Tobacco Use  . Smoking status: Never Smoker  . Smokeless tobacco: Never Used  Substance Use Topics  . Alcohol use: No  . Drug use: Never    Review  of Systems  Constitutional: No fever/chills Eyes: No visual changes.  ENT: No sore throat. Cardiovascular: Denies chest pain. Respiratory: Denies shortness of breath. Gastrointestinal: As above Genitourinary: As above Musculoskeletal: Negative for back pain. Skin: Negative for rash. Neurological: Negative for headaches    ____________________________________________   PHYSICAL EXAM:  VITAL SIGNS: ED Triage Vitals  Enc Vitals Group     BP 12/06/17 0555 (!) 154/79     Pulse Rate 12/06/17 0555 77     Resp 12/06/17 0555 (!) 27     Temp 12/06/17 0555 98.1 F (36.7 C)     Temp Source 12/06/17 0555 Oral     SpO2 12/06/17 0555 94 %     Weight 12/06/17 0558 68.9 kg (151 lb 14.4 oz)     Height 12/06/17 0558 1.524 m (5')     Head Circumference --      Peak Flow --      Pain Score 12/06/17 0557 0     Pain Loc --      Pain Edu? --      Excl. in [place]? --     Constitutional: Alert and oriented.  Eyes: Conjunctivae are normal.  Head: Atraumatic. Nose: No congestion/rhinnorhea. Mouth/Throat: Mucous membranes are dry Neck:  Painless ROM Cardiovascular:  Tachycardia, regular rhythm. Grossly normal heart sounds.  Good peripheral circulation. Respiratory: Normal respiratory effort.  No retractions. Lungs CTAB. Gastrointestinal: Soft and nontender.  Mild distention, no tenderness, right lower quadrant hernia noted, no significant tenderness palpation  Musculoskeletal:   Warm and well perfused Neurologic:  Normal speech and language. No gross focal neurologic deficits are appreciated.  Skin:  Skin is warm, dry and intact. No rash noted. Psychiatric: Mood and affect are normal. Speech and behavior are normal.  ____________________________________________   LABS (all labs ordered are listed, but only abnormal results are displayed)  Labs Reviewed  COMPREHENSIVE METABOLIC PANEL - Abnormal; Notable for the following components:      Result Value   Sodium 133 (*)    Potassium 5.2 (*)    Chloride 95 (*)    Glucose, Bld 165 (*)    Albumin 3.2 (*)    GFR calc non Af Amer 58 (*)    All other components within normal limits  CBC - Abnormal; Notable for the following components:   WBC 32.7 (*)    Hemoglobin 11.9 (*)    RDW 15.9 (*)    Platelets 527 (*)    All other components within normal limits  URINALYSIS, COMPLETE (UACMP) WITH MICROSCOPIC - Abnormal; Notable for the following components:   Color, Urine AMBER (*)    APPearance CLOUDY (*)    Protein, ur 30 (*)    Leukocytes, UA SMALL (*)    Bacteria, UA MANY (*)    All other components within normal limits  CULTURE, BLOOD (ROUTINE X 2)  CULTURE, BLOOD (ROUTINE X 2)  URINE CULTURE  LIPASE, BLOOD  TROPONIN I  LACTIC ACID, PLASMA  LACTIC ACID, PLASMA   ____________________________________________  EKG  ED ECG REPORT I, Lavonia Drafts, the attending physician, personally viewed and interpreted this ECG.  Date: 12/06/2017  Rhythm: normal sinus rhythm QRS Axis: normal Intervals: Abnormal ST/T Wave abnormalities: normal Narrative Interpretation: no evidence of acute  ischemia  ____________________________________________  RADIOLOGY  Chest x-ray KUB ____________________________________________   PROCEDURES  Procedure(s) performed: No  Procedures   Critical Care performed: yes  CRITICAL CARE Performed by: Lavonia Drafts   Total critical care time: 30 minutes  Critical care  time was exclusive of separately billable procedures and treating other patients.  Critical care was necessary to treat or prevent imminent or life-threatening deterioration.  Critical care was time spent personally by me on the following activities: development of treatment plan with patient and/or surrogate as well as nursing, discussions with consultants, evaluation of patient's response to treatment, examination of patient, obtaining history from patient or surrogate, ordering and performing treatments and interventions, ordering and review of laboratory studies, ordering and review of radiographic studies, pulse oximetry and re-evaluation of patient's condition.  ____________________________________________   INITIAL IMPRESSION / ASSESSMENT AND PLAN / ED COURSE  Pertinent labs & imaging results that were available during my care of the patient were reviewed by me and considered in my medical decision making (see chart for details).  Patient presents with nausea and vomiting intermittently over the last 3 days.  Abdominal exam demonstrates mild distention but no tenderness to palpation.  Will check labs, give IV Zofran and reevaluate.  Lab work significant for significantly elevated white blood cell count of 32.7.  Cultures and lactic acid added.  Pending urinalysis chest x-ray and KUB   KUB suspicious for dilated small bowel loops, sent for CT abdomen pelvis  CT confirms small bowel obstruction secondary to likely incarcerated hernia.  Unusual as the patient has no tenderness to palpation over this area.  Discussed with Dr. Rosana Hoes of surgery he will see the  patient in the ED    ____________________________________________   FINAL CLINICAL IMPRESSION(S) / ED DIAGNOSES  Final diagnoses:  Small bowel obstruction ([place])  Incarcerated hernia        Note:  This document was prepared using Dragon voice recognition software and may include unintentional dictation errors.    Lavonia Drafts, MD 12/06/17 [id number]

## 2017-12-06 NOTE — ED Triage Notes (Signed)
EMS pt toi Rm 26 from [facility] with report of vomited brown stool smelling emesis x 3 since 1130 pm last night. Denies pain.

## 2017-12-06 NOTE — H&P (Signed)
SURGICAL HISTORY & PHYSICAL (cpt [phone])  Patient seen and examined as described below by surgical PA-C, Ardell Isaacs.  Assessment/Plan: (ICD-10's: K80.52) 84 y.o. femalewith complete vs partial small bowel obstruction, likely attributable to post-surgical adhesions following prior remote Right colectomy with primary anastamosis in [place] for colon cancer rather than attributable to chronic, wide-defect easily reducible (and easily reduced) RLQ incisional hernia with dilated bowel entering and exiting hernia on CT with non-dilated bowel within CT, complicated by leukocytosis and chronic constipation/fecal impaction and by pertinent comorbidities including advanced chronological age, HTN, HLD, CKD (stage 3), cardiac arrhythmia (Left bundle branch block), COPD, hypothyroidism, hiatal hernia with GERD, and osteopenia.  - NPO for now, IV fluids - NG tube for nasogastric decompression - monitor ongoing bowel function and abdominal exam  - anticipate symptomatic relief within 24 - 48 hours following NGT insertion, followed by "rumbling" the following day and flatus either the same day or the day following the "rumbling" with anticipated length of stay ~3 - 5 days with successful non-operative management for 8 of 10 patients with small bowel obstruction attributed to post-surgical adhesions - will admit to surgical service with medical consultation for management of comorbidities             - surgical intervention if doesn't improve was also discussed  - follow up/trend repeat WBC (CBC) tomorrow morning - DVT prophylaxis, ambulation encouraged             - enemas for constipation  I have personally reviewed the patient's chart, reviewed and interpreted the CT (and discussed with radiologist, Dr. Nyoka Cowden), evaluated/examined the patient, proposed the above management, and discussed these  recommendations with the patient and her daughter to their expressed satisfaction as well as with patient's RN and both ED and medical physicians.  -- Marilynne Drivers Rosana Hoes, MD, [place]: [facility] - Partnering for exceptional care. Office: [phone]                          SURGICAL CONSULTATION NOTE (initial) - cpt:  or [phone]  HISTORY OF PRESENT ILLNESS (HPI):  84 y.o. female presented to [facility] ED today for evaluation of nausea and emesis. Patient reports 3 days of nausea and emesis which she described as brown in color. She denied abdominal pain. She has been able to tolerate PO intake a few times in the last 3 days. No fevers, chills, CP, SOB, diarrhea, urinary changes, leg pain or swelling. She reports a known history of right sided abdominal hernia for the last 20 years. Previous abdominal surgeries include abdominal hysterectomy and hemicolectomy in 2006 secondary to colon cancer. In the ED this morning KUB and CT were concerning for right lateral abdominal hernia with possible obstruction vs incarceration. Lab work was concerning for leukocytosis of 32.7.   Surgery is consulted by ED physician Dr. Lavonia Drafts, MD in this context for evaluation and management of nausea and emesis with possible small bowel obstruction and incarcerated right lateral abdominal hernia.    PAST MEDICAL HISTORY (PMH):      Past Medical History:  Diagnosis Date  . Allergic rhinitis 11/18/2013  . Asthma with COPD (chronic obstructive pulmonary disease) ([place]) 11/18/2013   Last Assessment & Plan:  No flair ups noted recently and breathing is essentially at baseline.    . Asthma without status asthmaticus    unspecified  . Borderline hypothyroidism 11/18/2013   Last Assessment & Plan:  Energy and tsh  are doing well  . Cancer [facility])    colon cancer- surgically removed in 2006  . Cholelithiases   . Chronic kidney disease   . Colon cancer ([place])   . Complete  left bundle branch block (LBBB) 11/18/2013  . Constipation 11/18/2013  . Diverticulosis   . Hiatal hernia    right  . HLD (hyperlipidemia) 11/18/2013   Last Assessment & Plan:  Appropriate diet is followed and no myalgia's are noted.    . Hyperlipidemia, unspecified   . Hypertensive kidney disease with CKD stage III ([place]) 11/18/2013   Last Assessment & Plan:  Is compliant with hypertensive medications without clear side effects or lack of control.  Nausea and itching are not symptomatic and nsaids are being avoided.    . Melanoma ([place])   . Osteopenia      PAST SURGICAL HISTORY ([place]):       Past Surgical History:  Procedure Laterality Date  . ABDOMINAL HYSTERECTOMY    . Bladder Lift    . BREAST EXCISIONAL BIOPSY Left 15+ yrs ago   neg  . COLON RESECTION  2006   colon cancer  . COLON SURGERY    . COLON SURGERY Right   . COLONOSCOPY WITH PROPOFOL N/A 06/01/2017   Procedure: COLONOSCOPY WITH PROPOFOL;  Surgeon: [name], MD;  Location: [facility] ENDOSCOPY;  Service: Endoscopy;  Laterality: N/A;  . ESOPHAGOGASTRODUODENOSCOPY (EGD) WITH PROPOFOL N/A 06/01/2017   Procedure: ESOPHAGOGASTRODUODENOSCOPY (EGD) WITH PROPOFOL;  Surgeon: [name], MD;  Location: [facility] ENDOSCOPY;  Service: Endoscopy;  Laterality: N/A;  . HEMICOLECTOMY Right   . SKIN CANCER EXCISION  2006   melanoma     MEDICATIONS:         Prior to Admission medications   Medication Sig Start Date End Date Taking? Authorizing Provider  acetaminophen (TYLENOL) 325 MG tablet Take 650 mg by mouth every 4 (four) hours as needed. for pain/ increased temp. May be administered orally, per G-tube if needed or rectally if unable to swallow (separate order). Maximum dose for 24 hours is 3,000 mg from all sources of Acetaminophen/ Tylenol   Yes [provider]  Amino Acids-Protein Hydrolys (FEEDING SUPPLEMENT, PRO-STAT SUGAR FREE 64,) LIQD Take 30 mLs by mouth 2 (two) times daily between meals. low  protein, low albumin   Yes [provider]  bisacodyl (DULCOLAX) 10 MG suppository Place 10 mg rectally 2 (two) times daily as needed.   Yes [provider]  calcium carbonate (TUMS EX) 750 MG chewable tablet Chew 2 tablets by mouth daily.    Yes [provider]  Camphor-Menthol-Methyl Sal 06-09-28 % CREA Apply 1 application topically 3 (three) times daily as needed. Apply thin film of topical to pain site. Okay to leave on bed side.   Yes [provider]  diclofenac sodium (VOLTAREN) 1 % GEL Apply 4 g topically 4 (four) times daily. Apply 4 grams to the right knee QID for pain,   Yes [provider]  docusate sodium (COLACE) 100 MG capsule Take 1 capsule (100 mg total) by mouth daily as needed. 04/14/17 04/14/18 Yes McShane, Gerda Diss, MD  fluticasone (FLONASE) 50 MCG/ACT nasal spray Place 2 sprays into both nostrils daily.  11/19/15  Yes [provider]  fluticasone furoate-vilanterol (BREO ELLIPTA) 100-25 MCG/INH AEPB Inhale 1 puff into the lungs daily.    Yes [provider]  ipratropium-albuterol (DUONEB) 0.5-2.5 (3) MG/3ML SOLN Take 3 mLs by nebulization 3 (three) times daily.    Yes [provider]  lactulose (CHRONULAC) 10 GM/15ML solution Take 30 g by mouth 2 (two) times daily as needed.   Yes [provider]  losartan (COZAAR) 25 MG tablet Take 25 mg by mouth daily as needed (BP >150/90).    Yes [provider]  Nutritional Supplements (ENSURE ENLIVE PO) Take 1 Bottle by mouth daily.    Yes [provider]  nystatin-triamcinolone (MYCOLOG II) cream Apply 1 application topically 2 (two) times daily. apply nystatin cream under the right breast to treat yeast infection/irritation   Yes [provider]  pantoprazole (PROTONIX) 40 MG tablet Take 40 mg by mouth daily. before a meal to help with esophagus problems    Yes [provider]  polyethylene glycol (MIRALAX  / GLYCOLAX) packet Take 17 g by mouth daily.   Yes [provider]  Probiotic Product (RISA-BID PROBIOTIC) TABS Take 1 tablet by mouth 2 (two) times daily.   Yes [provider]  sodium phosphate (FLEET) 7-19 GM/118ML ENEM Place 1 enema rectally every three (3) days as needed for mild constipation or moderate constipation. Once every 3 days as needed    Yes [provider]  trimethoprim (TRIMPEX) 100 MG tablet Take 100 mg by mouth daily.    Yes [provider]  NON FORMULARY Diet Type - Regular    [provider]     ALLERGIES:  No Known Allergies   SOCIAL HISTORY:  Social History        Socioeconomic History  . Marital status: Widowed    Spouse name: Not on file  . Number of children: Not on file  . Years of education: 37  . Highest education level: Bachelor's degree (e.g., BA, AB, BS)  Occupational History  . Not on file  Social Needs  . Financial resource strain: Not on file  . Food insecurity:    Worry: Not on file    Inability: Not on file  . Transportation needs:    Medical: Not on file    Non-medical: Not on file  Tobacco Use  . Smoking status: Never Smoker  . Smokeless tobacco: Never Used  Substance and Sexual Activity  . Alcohol use: No  . Drug use: Never  . Sexual activity: Not on file  Lifestyle  . Physical activity:    Days per week: Not on file    Minutes per session: Not on file  . Stress: Not on file  Relationships  . Social connections:    Talks on phone: Not on file    Gets together: Not on file    Attends religious service: Not on file    Active member of club or organization: Not on file    Attends meetings of clubs or organizations: Not on file    Relationship status: Not on file  . Intimate partner violence:    Fear of current or ex partner: Not on file    Emotionally abused: Not on file    Physically abused: Not on file    Forced sexual activity: Not  on file  Other Topics Concern  . Not on file  Social History Narrative   From assisted living facility, however after recent discharge from the hospital-discharge to short-term rehab   -Most transfers all through wheelchair      Non smoker   No smokeless tobacco use   No alcohol use   Widowed   DNR with HCPOA and Living Will     The patient currently resides (home / rehab facility /  nursing home): [facility] ([place]) The patient normally is (ambulatory / bedbound): Wheelchair   FAMILY HISTORY:       Family History  Problem Relation Age of Onset  . Breast cancer Daughter 71       passed at 39 from breast cancer  . Heart disease Mother   . Heart failure Mother   . Lung cancer Father      REVIEW OF SYSTEMS:  Constitutional: denies weight loss, fever, chills, or sweats  Eyes: denies any other vision changes, history of eye injury  ENT: denies sore throat, hearing problems  Respiratory: denies shortness of breath, wheezing  Cardiovascular: denies chest pain, palpitations  Gastrointestinal: denies abdominal pain, + N/V, denied diarrhea/and bowel function as per HPI Genitourinary: denies burning with urination or urinary frequency Musculoskeletal: denies any other joint pains or cramps  Skin: denies any other rashes or skin discolorations  Neurological: denies any other headache, dizziness, weakness  Psychiatric: denies any other depression, anxiety   All other review of systems were negative   VITAL SIGNS:  Temp:  [98.1 F (36.7 C)-98.6 F (37 C)] 98.6 F (37 C) (10/07 0900) Pulse Rate:  [71-85] 75 (10/07 0900) Resp:  [19-30] 24 (10/07 0800) BP: (144-154)/(78-102) 148/102 (10/07 0900) SpO2:  [94 %-96 %] 96 % (10/07 0900) Weight:  [68.9 kg] 68.9 kg (10/07 0558)     Height: 5' (152.4 cm) Weight: 68.9 kg BMI (Calculated): 29.67   INTAKE/OUTPUT:  This shift: No intake/output data recorded.  Last 2 shifts: @IOLAST2SHIFTS @   PHYSICAL  EXAM:  Constitutional:  -- Normal body habitus  -- Awake, alert, and oriented x3, no apparent distress Eyes:  -- Pupils equally round and reactive to light  -- No scleral icterus, B/L no occular discharge Ear, nose, throat: -- Neck is FROM WNL Pulmonary:  -- No wheezes or rhales -- Equal breath sounds bilaterally -- Breathing non-labored at rest Cardiovascular:  -- S1, S2 present  -- No pericardial rubs  Gastrointestinal:  -- Abdomen soft, nontender, non-distended, tympanic to percussion, + BS -- There is a large hernia in present in the RLQ, appreciable bowel on palpation which is firm in area, easily reducible Musculoskeletal and Integumentary:  -- Wounds or skin discoloration: None appreciated -- Extremities: B/L UE and LE FROM, hands and feet warm, no edema  Neurologic:  -- Motor function: Intact and symmetric -- Sensation: Intact and symmetric Psychiatric:  -- Mood and affect WNL   Labs:  CBC Latest Ref Rng & Units 12/06/2017 11/24/2017 10/01/2017  WBC 3.6 - 11.0 K/uL 32.7(H) 12.5(H) 11.7(H)  Hemoglobin 12.0 - 16.0 g/dL 11.9(L) 11.5(L) 12.5  Hematocrit 35.0 - 47.0 % 37.2 33.8(L) 37.7  Platelets 150 - 440 K/uL 527(H) 431 321   CMP Latest Ref Rng & Units 12/06/2017 11/24/2017 10/22/2017  Glucose 70 - 99 mg/dL 165(H) 117(H) 143(H)  BUN 8 - 23 mg/dL 21 26(H) 22  Creatinine 0.44 - 1.00 mg/dL 0.84 0.97 0.88  Sodium 135 - 145 mmol/L 133(L) 136 132(L)  Potassium 3.5 - 5.1 mmol/L 5.2(H) 4.4 4.4  Chloride 98 - 111 mmol/L 95(L) 99 98  CO2 22 - 32 mmol/L 29 28 25   Calcium 8.9 - 10.3 mg/dL 8.9 8.6(L) 8.7(L)  Total Protein 6.5 - 8.1 g/dL 6.6 5.7(L) -  Total Bilirubin 0.3 - 1.2 mg/dL 1.2 0.5 -  Alkaline Phos 38 - 126 U/L 65 60 -  AST 15 - 41 U/L 30 21 -  ALT 0 - 44 U/L 8 9 -  Imaging studies:   --KUB 10/07:   IMPRESSION: Probable dilated small bowel loop seen in right side of abdomen which may represent focal ileus or potentially small bowel obstruction.  Potentially this bowel loop may be located within right lower quadrant hernia seen on prior CT scan. CT scan may be performed for further evaluation.  --CT Abdomen/Pelvis on 10/07  IMPRESSION: Large right lateral hernia is noted in right lower quadrant of the abdomen which contains several loops of dilated small bowel, with dilatation of more proximal small bowel loops consistent with probable incarceration and obstruction. Moderate size sliding-type hiatal hernia. Sigmoid diverticulosis without inflammation. Mild urinary bladder distention. Cholelithiasis. Aortic Atherosclerosis (ICD10-I70.0)  . Assessment/Plan: (ICD-10's: K47.52) 84 y.o. female with chronic right ventral hernia easily reducible, complicated by pertinent comorbidities including COPD, CKD, HLD, HTN, Hypothyroidism, History of colon cancer s/p hemicolectomy, immobility, and advanced age.              - Discussed with Dr. Rosana Hoes, MD who discussed case with patient and her family extensively. This appears to be a large chronic hernia which is reducible. Question of obstruction is likely from adhesive disease from previous abdominal surgeries             - ED staff will place NGT and place to low intermittent suction             - NPO             - IVF             - Monitor leukocytosis             - Will order SMOG enema             - DVT prophylaxis  All of the above findings and recommendations were discussed with the patient and her family, and all of patient's and her family's questions were answered to their expressed satisfaction.  Thank you for the opportunity to participate in this patient's care.   -- Edison Simon, PA-C [facility] 12/06/2017, 10:54 AM [phone] M-F: 7am - 4pm

## 2017-12-06 NOTE — Clinical Social Work Note (Signed)
Clinical Social Work Assessment  Patient Details  Name: Beth Chen MRN: [id number] Date of Birth: 1921-05-08  Date of referral:  12/06/17               Reason for consult:  Other (Comment Required)(From [place])                Permission sought to share information with:  Facility Sport and exercise psychologist, Family Supports Permission granted to share information::  Yes, Verbal Permission Granted  Name::     Daughter Beth Chen and Beth Chen ( sons)  Agency::  [facility]  Relationship::     Contact Information:     Housing/Transportation Living arrangements for the past 2 months:  [place] of Information:  Adult Children Patient Interpreter Needed:  None Criminal Activity/Legal Involvement Pertinent to Current Situation/Hospitalization:  No - Comment as needed Significant Relationships:  Adult Children Lives with:  Facility Resident Do you feel safe going back to the place where you live?  Yes Need for family participation in patient care:  Yes (Comment)  Care giving concerns: Daughter was notified by facility   Social Worker assessment / plan: LCSW introduced myself to patient who indicated her daughter Beth Chen is her Conception. Patient is a 84 year old women ( widowed) who had come to the ED with vomiting and stomach pain. EDP indicated this patient will be admitted, She has 3 sons and 1 daughter a and is widowed. Patient is alert and oriented x4. Patient has been a resident of [facility] and receives daily care with all her ADL's she is unable to walk and uses a wheel chair. Insured with [place] [employer] care/medicare.   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  Emotional Assessment Appearance:  Appears stated age Attitude/Demeanor/Rapport:  Gracious, Engaged Affect (typically observed):  Accepting, Calm Orientation:  Oriented to Self, Oriented to Place, Oriented to  Time, Oriented to Situation Alcohol / Substance use:  Not Applicable Psych involvement (Current and /or in the community):  No (Comment)  Discharge Needs  Concerns to be addressed:  Care Coordination Readmission within the last 30 days:  No Current discharge risk:  None Barriers to Discharge:  No Barriers Identified   Beth Reamer, LCSW 12/06/2017, 9:38 AM

## 2017-12-06 NOTE — ED Notes (Signed)
Resumed care from [place], [place]. Pt resting with family at bedside post NG tube insertion.

## 2017-12-06 NOTE — Consult Note (Signed)
[place] Consultation  Beth Chen QAS:[id number] DOB: 1921/09/04 DOA: 12/06/2017 PCP: Kirk Ruths, MD   Requesting physician: Dvais MD Date of consultation: 12/06/2017 Reason for consultation: Medical management of hypertension, copd, ckd  CHIEF COMPLAINT:   Chief Complaint  Patient presents with  . Emesis    HISTORY OF PRESENT ILLNESS: Beth Chen  is a 84 y.o. female with a known history of colon cancer, chronic kidney disease, cholelithiasis, diverticulosis, large ventral hernia, hyperlipidemia, hypertension currently under surgical service for nausea vomiting.  Patient has NG tube with suction currently.  She does not know when she had last bowel movement.  Mild abdominal distention noted which could be secondary to ventral hernia.  No complaints of any abdominal pain.  NG tube drainage has been minimal.  No fever and chills.  Has elevated WBC count.  PAST MEDICAL HISTORY:   Past Medical History:  Diagnosis Date  . Allergic rhinitis 11/18/2013  . Asthma with COPD (chronic obstructive pulmonary disease) ([place]) 11/18/2013   Last Assessment & Plan:  No flair ups noted recently and breathing is essentially at baseline.    . Asthma without status asthmaticus    unspecified  . Borderline hypothyroidism 11/18/2013   Last Assessment & Plan:  Energy and tsh are doing well  . Cancer [facility])    colon cancer- surgically removed in 2006  . Cholelithiases   . Chronic kidney disease   . Colon cancer ([place])   . Complete left bundle branch block (LBBB) 11/18/2013  . Constipation 11/18/2013  . Diverticulosis   . Hiatal hernia    right  . HLD (hyperlipidemia) 11/18/2013   Last Assessment & Plan:  Appropriate diet is followed and no myalgia's are noted.    . Hyperlipidemia, unspecified   . Hypertensive kidney disease with CKD stage III ([place]) 11/18/2013   Last Assessment & Plan:  Is compliant with hypertensive medications without clear side effects or lack of control.  Nausea and  itching are not symptomatic and nsaids are being avoided.    . Melanoma ([place])   . Osteopenia     PAST SURGICAL HISTORY:  Past Surgical History:  Procedure Laterality Date  . ABDOMINAL HYSTERECTOMY    . Bladder Lift    . BREAST EXCISIONAL BIOPSY Left 15+ yrs ago   neg  . COLON RESECTION  2006   colon cancer  . COLON SURGERY    . COLON SURGERY Right   . COLONOSCOPY WITH PROPOFOL N/A 06/01/2017   Procedure: COLONOSCOPY WITH PROPOFOL;  Surgeon: [name], MD;  Location[facility] ENDOSCOPY;  Service: Endoscopy;  Laterality: N/A;  . ESOPHAGOGASTRODUODENOSCOPY (EGD) WITH PROPOFOL N/A 06/01/2017   Procedure: ESOPHAGOGASTRODUODENOSCOPY (EGD) WITH PROPOFOL;  Surgeon: [name], MD;  Location: [facility] ENDOSCOPY;  Service: Endoscopy;  Laterality: N/A;  . HEMICOLECTOMY Right   . SKIN CANCER EXCISION  2006   melanoma    SOCIAL HISTORY:  Social History   Tobacco Use  . Smoking status: Never Smoker  . Smokeless tobacco: Never Used  Substance Use Topics  . Alcohol use: No    FAMILY HISTORY:  Family History  Problem Relation Age of Onset  . Breast cancer Daughter 72       passed at 85 from breast cancer  . Heart disease Mother   . Heart failure Mother   . Lung cancer Father     DRUG ALLERGIES: No Known Allergies  REVIEW OF SYSTEMS:   CONSTITUTIONAL: No fever,has  fatigue and weakness.  EYES: No blurred or double  vision.  EARS, NOSE, AND THROAT: No tinnitus or ear pain.  RESPIRATORY: No cough, shortness of breath, wheezing or hemoptysis.  CARDIOVASCULAR: No chest pain, orthopnea, edema.  GASTROINTESTINAL: Has nausea, vomiting, no diarrhea  mild abdominal pain.  GENITOURINARY: No dysuria, hematuria.  ENDOCRINE: No polyuria, nocturia,  HEMATOLOGY: No anemia, easy bruising or bleeding SKIN: No rash or lesion. MUSCULOSKELETAL: No joint pain or arthritis.   NEUROLOGIC: No tingling, numbness, weakness.  PSYCHIATRY: No anxiety or depression.   MEDICATIONS AT HOME:  Prior to  Admission medications   Medication Sig Start Date End Date Taking? Authorizing Provider  acetaminophen (TYLENOL) 325 MG tablet Take 650 mg by mouth every 4 (four) hours as needed. for pain/ increased temp. May be administered orally, per G-tube if needed or rectally if unable to swallow (separate order). Maximum dose for 24 hours is 3,000 mg from all sources of Acetaminophen/ Tylenol   Yes [provider]  Amino Acids-Protein Hydrolys (FEEDING SUPPLEMENT, PRO-STAT SUGAR FREE 64,) LIQD Take 30 mLs by mouth 2 (two) times daily between meals. low protein, low albumin   Yes [provider]  bisacodyl (DULCOLAX) 10 MG suppository Place 10 mg rectally 2 (two) times daily as needed.   Yes [provider]  calcium carbonate (TUMS EX) 750 MG chewable tablet Chew 2 tablets by mouth daily.    Yes [provider]  Camphor-Menthol-Methyl Sal 06-09-28 % CREA Apply 1 application topically 3 (three) times daily as needed. Apply thin film of topical to pain site. Okay to leave on bed side.   Yes [provider]  diclofenac sodium (VOLTAREN) 1 % GEL Apply 4 g topically 4 (four) times daily. Apply 4 grams to the right knee QID for pain,   Yes [provider]  docusate sodium (COLACE) 100 MG capsule Take 1 capsule (100 mg total) by mouth daily as needed. 04/14/17 04/14/18 Yes McShane, Gerda Diss, MD  fluticasone (FLONASE) 50 MCG/ACT nasal spray Place 2 sprays into both nostrils daily.  11/19/15  Yes [provider]  fluticasone furoate-vilanterol (BREO ELLIPTA) 100-25 MCG/INH AEPB Inhale 1 puff into the lungs daily.    Yes [provider]  ipratropium-albuterol (DUONEB) 0.5-2.5 (3) MG/3ML SOLN Take 3 mLs by nebulization 3 (three) times daily.    Yes [provider]  lactulose (CHRONULAC) 10 GM/15ML solution Take 30 g by mouth 2 (two) times daily as needed.   Yes [provider]  losartan (COZAAR) 25 MG tablet Take 25 mg by mouth daily as  needed (BP >150/90).    Yes [provider]  Nutritional Supplements (ENSURE ENLIVE PO) Take 1 Bottle by mouth daily.    Yes [provider]  nystatin-triamcinolone (MYCOLOG II) cream Apply 1 application topically 2 (two) times daily. apply nystatin cream under the right breast to treat yeast infection/irritation   Yes [provider]  pantoprazole (PROTONIX) 40 MG tablet Take 40 mg by mouth daily. before a meal to help with esophagus problems    Yes [provider]  polyethylene glycol (MIRALAX / GLYCOLAX) packet Take 17 g by mouth daily.   Yes [provider]  Probiotic Product (RISA-BID PROBIOTIC) TABS Take 1 tablet by mouth 2 (two) times daily.   Yes [provider]  sodium phosphate (FLEET) 7-19 GM/118ML ENEM Place 1 enema rectally every three (3) days as needed for mild constipation or moderate constipation. Once every 3 days as needed    Yes [provider]  trimethoprim (TRIMPEX) 100 MG tablet Take  100 mg by mouth daily.    Yes [provider]  NON FORMULARY Diet Type - Regular    [provider]      PHYSICAL EXAMINATION:   VITAL SIGNS: Blood pressure 140/73, pulse 75, temperature 97.9 F (36.6 C), temperature source Oral, resp. rate 16, height 5' (1.524 m), weight 68.9 kg, SpO2 95 %.  GENERAL:  84 y.o.-year-old patient lying in the bed with no acute distress.  EYES: Pupils equal, round, reactive to light and accommodation. No scleral icterus. Extraocular muscles intact.  HEENT: Head atraumatic, normocephalic. Oropharynx and nasopharynx : Has NGT with suction NECK:  Supple, no jugular venous distention. No thyroid enlargement, no tenderness.  LUNGS: Normal breath sounds bilaterally, no wheezing, rales,rhonchi or crepitation. No use of accessory muscles of respiration.  CARDIOVASCULAR: S1, S2 normal. No murmurs, rubs, or gallops.  ABDOMEN: Soft, nontender, distended. Bowel sounds absent. No organomegaly or  mass.  Abdominal mass noted EXTREMITIES: No pedal edema, cyanosis, or clubbing.  NEUROLOGIC: Cranial nerves II through XII are intact. Muscle strength 5/5 in all extremities. Sensation intact. Gait not checked.  PSYCHIATRIC: The patient is alert and oriented x 3.  SKIN: No obvious rash, lesion, or ulcer.   LABORATORY PANEL:   CBC Recent Labs  Lab 12/06/17 0605  WBC 32.7*  HGB 11.9*  HCT 37.2  PLT 527*  MCV 85.4  MCH 27.5  MCHC 32.1  RDW 15.9*   ------------------------------------------------------------------------------------------------------------------  Chemistries  Recent Labs  Lab 12/06/17 0605  Beth 133*  K 5.2*  CL 95*  CO2 29  GLUCOSE 165*  BUN 21  CREATININE 0.84  CALCIUM 8.9  AST 30  ALT 8  ALKPHOS 65  BILITOT 1.2   ------------------------------------------------------------------------------------------------------------------ estimated creatinine clearance is 35.5 mL/min (by C-G formula based on SCr of 0.84 mg/dL). ------------------------------------------------------------------------------------------------------------------ No results for input(s): TSH, T4TOTAL, T3FREE, THYROIDAB in the last 72 hours.  Invalid input(s): FREET3   Coagulation profile No results for input(s): INR, PROTIME in the last 168 hours. ------------------------------------------------------------------------------------------------------------------- No results for input(s): DDIMER in the last 72 hours. -------------------------------------------------------------------------------------------------------------------  Cardiac Enzymes Recent Labs  Lab 12/06/17 0605  TROPONINI <0.03   ------------------------------------------------------------------------------------------------------------------ Invalid input(s): POCBNP  ---------------------------------------------------------------------------------------------------------------  Urinalysis    Component Value  Date/Time   COLORURINE AMBER (A) 12/06/2017 0703   APPEARANCEUR CLOUDY (A) 12/06/2017 0703   APPEARANCEUR Cloudy (A) 09/20/2017 1443   LABSPEC 1.017 12/06/2017 0703   LABSPEC 1.021 09/19/2011 1607   PHURINE 6.0 12/06/2017 0703   GLUCOSEU NEGATIVE 12/06/2017 0703   GLUCOSEU Negative 09/19/2011 1607   HGBUR NEGATIVE 12/06/2017 0703   BILIRUBINUR NEGATIVE 12/06/2017 0703   BILIRUBINUR Negative 09/20/2017 1443   BILIRUBINUR Negative 09/19/2011 1607   KETONESUR NEGATIVE 12/06/2017 0703   PROTEINUR 30 (A) 12/06/2017 0703   NITRITE NEGATIVE 12/06/2017 0703   LEUKOCYTESUR SMALL (A) 12/06/2017 0703   LEUKOCYTESUR 3+ (A) 09/20/2017 1443   LEUKOCYTESUR Trace 09/19/2011 1607     RADIOLOGY: Dg Abdomen 1 View  Result Date: 12/06/2017 CLINICAL DATA:  Vomiting. EXAM: ABDOMEN - 1 VIEW COMPARISON:  Radiographs and CT scan of April 14, 2017. FINDINGS: Large amount of stool seen throughout the colon. Probable dilated small bowel loop is seen in right side of the abdomen. No radio-opaque calculi or other significant radiographic abnormality are seen. IMPRESSION: Probable dilated small bowel loop seen in right side of abdomen which may represent focal ileus or potentially small bowel obstruction. Potentially this bowel loop may be located within right lower quadrant hernia seen on prior CT  scan. CT scan may be performed for further evaluation. Electronically Signed   By: Marijo Conception, M.D.   On: 12/06/2017 07:48   Ct Abdomen Pelvis W Contrast  Result Date: 12/06/2017 CLINICAL DATA:  Acute right-sided abdominal pain. EXAM: CT ABDOMEN AND PELVIS WITH CONTRAST TECHNIQUE: Multidetector CT imaging of the abdomen and pelvis was performed using the standard protocol following bolus administration of intravenous contrast. CONTRAST:  79mL OMNIPAQUE IOHEXOL 300 MG/ML  SOLN COMPARISON:  CT scan of April 14, 2017. FINDINGS: Lower chest: Moderate size sliding-type hiatal hernia is noted. Visualized lung bases are  unremarkable. Hepatobiliary: Mild cholelithiasis is noted without inflammation. No biliary dilatation is noted. Liver is unremarkable. Pancreas: Unremarkable. No pancreatic ductal dilatation or surrounding inflammatory changes. Spleen: Normal in size without focal abnormality. Adrenals/Urinary Tract: Adrenal glands and kidneys appear normal. No hydronephrosis or renal obstruction is noted. No renal or ureteral calculi are noted. Urinary bladder is mildly distended. Stomach/Bowel: Large amount of stool seen in the descending colon and rectum. Diverticulosis of the sigmoid colon is noted without inflammation. Large right lateral hernia is noted in right lower quadrant which contains several dilated loops of small bowel, with dilatation of more proximal small bowel loops suggesting obstruction. Vascular/Lymphatic: Aortic atherosclerosis. No enlarged abdominal or pelvic lymph nodes. Reproductive: Status post hysterectomy. No adnexal masses. Other: No ascites is noted. Musculoskeletal: Old L1 compression fracture is noted. No acute osseous abnormality is noted. IMPRESSION: Large right lateral hernia is noted in right lower quadrant of the abdomen which contains several loops of dilated small bowel, with dilatation of more proximal small bowel loops consistent with probable incarceration and obstruction. Moderate size sliding-type hiatal hernia. Sigmoid diverticulosis without inflammation. Mild urinary bladder distention. Cholelithiasis. Aortic Atherosclerosis (ICD10-I70.0). Electronically Signed   By: Marijo Conception, M.D.   On: 12/06/2017 08:46   Dg Chest Portable 1 View  Result Date: 12/06/2017 CLINICAL DATA:  Wheezing.  Vomiting. EXAM: PORTABLE CHEST 1 VIEW COMPARISON:  Radiographs 07/13/2017 FINDINGS: Cardiomediastinal contours are unchanged with mild cardiomegaly and retrocardiac hiatal hernia. Coarse lung markings appear chronic. Pulmonary vasculature is normal. No consolidation, pleural effusion, or  pneumothorax. No acute osseous abnormalities are seen. Remote right rib fractures. IMPRESSION: No acute findings. Electronically Signed   By: Keith Rake M.D.   On: 12/06/2017 06:59   Dg Abd Portable 1 View  Result Date: 12/06/2017 CLINICAL DATA:  NG tube placement EXAM: PORTABLE ABDOMEN - 1 VIEW COMPARISON:  12/06/2017 CT FINDINGS: NG tube is in the distal stomach. Dilated right abdominal small bowel loops again noted. No free air. IMPRESSION: NG tube in the distal stomach. Electronically Signed   By: Rolm Baptise M.D.   On: 12/06/2017 11:44    EKG: Orders placed or performed during the hospital encounter of 12/06/17  . EKG 12-Lead  . EKG 12-Lead  . ED EKG  . ED EKG    IMPRESSION AND PLAN:  84 year old elderly female patient with CKD stage III, hypertension, COPD,, hyperlipidemia, large ventral hernia currently under surgical service for nausea and vomiting and abdominal distention  -Large ventral hernia with bowel obstruction Continue NG tube with suction Surgery follow-up Enemas being planned for bowel movement IV fluids and monitor electrolytes N.p.o. for now  -Leukocytosis Watch for bowel ischemia and follow-up lactic acid level Empiric IV Zosyn antibiotic  -Emphysema Stable  -GI prophylaxis with IV Pepcid  -DVT prophylaxis with subcu heparin  -CKD stage III Monitor renal function    All the records are reviewed and case  discussed with ED provider. Management plans discussed with the patient, family and they are in agreement.  CODE STATUS: DNR    Code Status Orders  (From admission, onward)         Start     Ordered   12/06/17 1104  Full code  Continuous     12/06/17 1110        Code Status History    Date Active Date Inactive Code Status Order ID Comments User Context   05/30/2017 1629 06/02/2017 1825 DNR [id number]  Gladstone Lighter, MD ED   09/05/2016 2050 09/08/2016 1631 DNR [id number]  Vaughan Basta, MD Inpatient    Advance Directive  Documentation     Most Recent Value  Type of Advance Directive  Healthcare Power of [place], Out of facility DNR (pink MOST or yellow form)  Pre-existing out of facility DNR order (yellow form or pink MOST form)  -  "MOST" Form in Place?  -       TOTAL TIME TAKING CARE OF THIS PATIENT: 53 minutes.    [name] M.D on 12/06/2017 at 1:57 PM  Between 7am to 6pm - Pager - [phone]  After 6pm go to [email or web address] - password [email or web address]  [facility] Office  [phone]  CC: Primary care physician; Kirk Ruths, MD

## 2017-12-06 NOTE — ED Notes (Signed)
Dr. Rosana Hoes spoke with patient's daughter per patient permission and pt now agreeable to NG tube insertion.

## 2017-12-06 NOTE — Progress Notes (Signed)
Pharmacy Antibiotic Note  Beth Chen is a 84 y.o. female admitted on 12/06/2017 with IAI, empiric treatment.  Pharmacy has been consulted for Zosyn dosing.  Plan: Zosyn 3.375g IV q8h (4 hour infusion).  Height: 5' (152.4 cm) Weight: 151 lb 14.4 oz (68.9 kg) IBW/kg (Calculated) : 45.5  Temp (24hrs), Avg:98.2 F (36.8 C), Min:97.9 F (36.6 C), Max:98.6 F (37 C)  Recent Labs  Lab 12/06/17 0605 12/06/17 0703 12/06/17 1255  WBC 32.7*  --   --   CREATININE 0.84  --   --   LATICACIDVEN  --  1.5 1.4    Estimated Creatinine Clearance: 35.5 mL/min (by C-G formula based on SCr of 0.84 mg/dL).    No Known Allergies  Antimicrobials this admission: Zosyn 10/7 >>  Dose adjustments this admission:   Microbiology results: 10/7 BCx x2: sent 10/7 UCx: sent   Thank you for allowing pharmacy to be a part of this patient's care.  Rocky Morel 12/06/2017 2:09 PM

## 2017-12-06 NOTE — ED Notes (Signed)
Pt refusing NG tube at this time. Pt stating that she wants to call her daughter and come home. Dr. Rosana Hoes informed.

## 2017-12-06 NOTE — NC FL2 (Signed)
[place] LEVEL OF CARE SCREENING TOOL     IDENTIFICATION  Patient Name: Beth Chen Birthdate: 09-10-1921 Sex: female Admission Date (Current Location): 12/06/2017  [place] and [place] Number:  Engineering geologist and Address:  [facility], [address] [place], [place] [address]      Provider Number: [id number]  Attending Physician Name and Address:  Lavonia Drafts, MD  Relative Name and Phone Number:     Romell [facility] [phone]  Current Level of Care: Hospital Recommended Level of Care: [place] Prior Approval Number:    Date Approved/Denied:   PASRR Number:  [id number]  Discharge Plan: SNF    Current Diagnoses: Patient Active Problem List   Diagnosis Date Noted  . Essential hypertension, benign 11/20/2017  . Frequent UTI 11/20/2017  . Primary osteoarthritis of right knee 11/20/2017  . Protein-calorie malnutrition ([place]) 08/30/2017  . Reflux esophagitis   . Anemia 05/30/2017  . Generalized weakness 05/20/2017  . Lower GI bleed 09/07/2016  . GI bleed 09/05/2016  . Allergic rhinitis 11/18/2013  . Asthma with COPD (chronic obstructive pulmonary disease) ([place]) 11/18/2013  . Borderline hypothyroidism 11/18/2013  . Complete left bundle branch block (LBBB) 11/18/2013  . Constipation 11/18/2013  . Hypertensive kidney disease with CKD stage III ([place]) 11/18/2013    Orientation RESPIRATION BLADDER Height & Weight     Self, Time, Situation, Place  Normal Continent, Incontinent Weight: 151 lb 14.4 oz (68.9 kg) Height:  5' (152.4 cm)  BEHAVIORAL SYMPTOMS/MOOD NEUROLOGICAL BOWEL NUTRITION STATUS      Continent Diet(Normal)  AMBULATORY STATUS COMMUNICATION OF NEEDS Skin   Extensive Assist Verbally Normal                       Personal Care Assistance Level of Assistance  Bathing, Feeding, Dressing, Total care Bathing Assistance: Maximum assistance Feeding assistance: Independent Dressing  Assistance: Limited assistance Total Care Assistance: Limited assistance   Functional Limitations Info  Sight, Hearing, Speech Sight Info: Adequate Hearing Info: Impaired(uses hearing aid left ear) Speech Info: Adequate    SPECIAL CARE FACTORS FREQUENCY                       Contractures Contractures Info: Not present    Additional Factors Info  Code Status Code Status Info: Full             Current Medications (12/06/2017):  This is the current hospital active medication list No current facility-administered medications for this encounter.    Current Outpatient Medications  Medication Sig Dispense Refill  . acetaminophen (TYLENOL) 325 MG tablet Take 650 mg by mouth every 4 (four) hours as needed. for pain/ increased temp. May be administered orally, per G-tube if needed or rectally if unable to swallow (separate order). Maximum dose for 24 hours is 3,000 mg from all sources of Acetaminophen/ Tylenol    . Amino Acids-Protein Hydrolys (FEEDING SUPPLEMENT, PRO-STAT SUGAR FREE 64,) LIQD Take 30 mLs by mouth 2 (two) times daily between meals. low protein, low albumin    . bisacodyl (DULCOLAX) 10 MG suppository Place 10 mg rectally 2 (two) times daily as needed.    . calcium carbonate (TUMS EX) 750 MG chewable tablet Chew 2 tablets by mouth daily.     . Camphor-Menthol-Methyl Sal 06-09-28 % CREA Apply 1 application topically 3 (three) times daily as needed. Apply thin film of topical to pain site. Okay to leave on bed side.    Marland Kitchen  diclofenac sodium (VOLTAREN) 1 % GEL Apply 4 g topically 4 (four) times daily. Apply 4 grams to the right knee QID for pain,    . docusate sodium (COLACE) 100 MG capsule Take 1 capsule (100 mg total) by mouth daily as needed. 30 capsule 0  . fluticasone (FLONASE) 50 MCG/ACT nasal spray Place 2 sprays into both nostrils daily.     . fluticasone furoate-vilanterol (BREO ELLIPTA) 100-25 MCG/INH AEPB Inhale 1 puff into the lungs daily.     Marland Kitchen  ipratropium-albuterol (DUONEB) 0.5-2.5 (3) MG/3ML SOLN Take 3 mLs by nebulization 3 (three) times daily.     Marland Kitchen lactulose (CHRONULAC) 10 GM/15ML solution Take 30 g by mouth 2 (two) times daily as needed.    Marland Kitchen losartan (COZAAR) 25 MG tablet Take 25 mg by mouth daily as needed (BP >150/90).     . Nutritional Supplements (ENSURE ENLIVE PO) Take 1 Bottle by mouth daily.     Marland Kitchen nystatin-triamcinolone (MYCOLOG II) cream Apply 1 application topically 2 (two) times daily. apply nystatin cream under the right breast to treat yeast infection/irritation    . pantoprazole (PROTONIX) 40 MG tablet Take 40 mg by mouth daily. before a meal to help with esophagus problems     . polyethylene glycol (MIRALAX / GLYCOLAX) packet Take 17 g by mouth daily.    . Probiotic Product (RISA-BID PROBIOTIC) TABS Take 1 tablet by mouth 2 (two) times daily.    . sodium phosphate (FLEET) 7-19 GM/118ML ENEM Place 1 enema rectally every three (3) days as needed for mild constipation or moderate constipation. Once every 3 days as needed     . trimethoprim (TRIMPEX) 100 MG tablet Take 100 mg by mouth daily.     . NON FORMULARY Diet Type - Regular       Discharge Medications: Please see discharge summary for a list of discharge medications.  Relevant Imaging Results:  Relevant Lab Results:   Additional Information [id number]  [name], [place]

## 2017-12-06 NOTE — ED Notes (Signed)
Report to Mary, RN

## 2017-12-06 NOTE — ED Notes (Signed)
Patient transported to CT 

## 2017-12-06 NOTE — Consult Note (Addendum)
SURGICAL HISTORY & PHYSICAL (cpt [id number])  Patient seen and examined as described below by surgical PA-C, Ardell Isaacs.  Assessment/Plan: (ICD-10's: K67.52) 84 y.o. female with complete vs partial small bowel obstruction, likely attributable to post-surgical adhesions following prior remote Right colectomy with primary anastamosis in [place] for colon cancer rather than attributable to chronic, wide-defect easily reducible (and easily reduced) RLQ incisional hernia with dilated bowel entering and exiting hernia on CT with non-dilated bowel within CT, complicated by leukocytosis and chronic constipation/fecal impaction and by pertinent comorbidities including advanced chronological age, HTN, HLD, CKD (stage 3), cardiac arrhythmia (Left bundle branch block), COPD, hypothyroidism, hiatal hernia with GERD, and osteopenia.  - NPO for now, IV fluids             - NG tube for nasogastric decompression             - monitor ongoing bowel function and abdominal exam              - anticipate symptomatic relief within 24 - 48 hours following NGT insertion, followed by "rumbling" the following day and flatus either the same day or the day following the "rumbling" with anticipated length of stay ~3 - 5 days with successful non-operative management for 8 of 10 patients with small bowel obstruction attributed to post-surgical adhesions             - will admit to surgical service with medical consultation for management of comorbidities  - surgical intervention if doesn't improve was also discussed  - follow up/trend repeat WBC (CBC) tomorrow morning             - DVT prophylaxis, ambulation encouraged   - enemas for constipation  I have personally reviewed the patient's chart, reviewed and interpreted the CT (and discussed with radiologist, Dr. Nyoka Cowden), evaluated/examined the patient, proposed the above management, and discussed these recommendations with the patient and her daughter to their  expressed satisfaction as well as with patient's RN and both ED and medical physicians.  -- Marilynne Drivers Rosana Hoes, MD, [place]: [facility] - Partnering for exceptional care. Office: [phone]    SURGICAL CONSULTATION NOTE (initial) - cpt:  or [phone]  HISTORY OF PRESENT ILLNESS (HPI):  84 y.o. female presented to [facility] ED today for evaluation of nausea and emesis. Patient reports 3 days of nausea and emesis which she described as brown in color. She denied abdominal pain. She has been able to tolerate PO intake a few times in the last 3 days. No fevers, chills, CP, SOB, diarrhea, urinary changes, leg pain or swelling. She reports a known history of right sided abdominal hernia for the last 20 years. Previous abdominal surgeries include abdominal hysterectomy and hemicolectomy in 2006 secondary to colon cancer. In the ED this morning KUB and CT were concerning for right lateral abdominal hernia with possible obstruction vs incarceration. Lab work was concerning for leukocytosis of 32.7.   Surgery is consulted by ED physician Dr. Lavonia Drafts, MD in this context for evaluation and management of nausea and emesis with possible small bowel obstruction and incarcerated right lateral abdominal hernia.    PAST MEDICAL HISTORY (PMH):  Past Medical History:  Diagnosis Date  . Allergic rhinitis 11/18/2013  . Asthma with COPD (chronic obstructive pulmonary disease) ([place]) 11/18/2013   Last Assessment & Plan:  No flair ups noted recently and breathing is essentially at baseline.    . Asthma without status asthmaticus    unspecified  .  Borderline hypothyroidism 11/18/2013   Last Assessment & Plan:  Energy and tsh are doing well  . Cancer [facility])    colon cancer- surgically removed in 2006  . Cholelithiases   . Chronic kidney disease   . Colon cancer ([place])   . Complete left bundle branch block (LBBB) 11/18/2013  . Constipation 11/18/2013  . Diverticulosis   . Hiatal  hernia    right  . HLD (hyperlipidemia) 11/18/2013   Last Assessment & Plan:  Appropriate diet is followed and no myalgia's are noted.    . Hyperlipidemia, unspecified   . Hypertensive kidney disease with CKD stage III ([place]) 11/18/2013   Last Assessment & Plan:  Is compliant with hypertensive medications without clear side effects or lack of control.  Nausea and itching are not symptomatic and nsaids are being avoided.    . Melanoma ([place])   . Osteopenia      PAST SURGICAL HISTORY ([place]):  Past Surgical History:  Procedure Laterality Date  . ABDOMINAL HYSTERECTOMY    . Bladder Lift    . BREAST EXCISIONAL BIOPSY Left 15+ yrs ago   neg  . COLON RESECTION  2006   colon cancer  . COLON SURGERY    . COLON SURGERY Right   . COLONOSCOPY WITH PROPOFOL N/A 06/01/2017   Procedure: COLONOSCOPY WITH PROPOFOL;  Surgeon: [name], MD;  Location: [facility] ENDOSCOPY;  Service: Endoscopy;  Laterality: N/A;  . ESOPHAGOGASTRODUODENOSCOPY (EGD) WITH PROPOFOL N/A 06/01/2017   Procedure: ESOPHAGOGASTRODUODENOSCOPY (EGD) WITH PROPOFOL;  Surgeon: [name], MD;  Location: [facility] ENDOSCOPY;  Service: Endoscopy;  Laterality: N/A;  . HEMICOLECTOMY Right   . SKIN CANCER EXCISION  2006   melanoma     MEDICATIONS:  Prior to Admission medications   Medication Sig Start Date End Date Taking? Authorizing Provider  acetaminophen (TYLENOL) 325 MG tablet Take 650 mg by mouth every 4 (four) hours as needed. for pain/ increased temp. May be administered orally, per G-tube if needed or rectally if unable to swallow (separate order). Maximum dose for 24 hours is 3,000 mg from all sources of Acetaminophen/ Tylenol   Yes [provider]  Amino Acids-Protein Hydrolys (FEEDING SUPPLEMENT, PRO-STAT SUGAR FREE 64,) LIQD Take 30 mLs by mouth 2 (two) times daily between meals. low protein, low albumin   Yes [provider]  bisacodyl (DULCOLAX) 10 MG suppository Place 10 mg rectally 2 (two) times daily as needed.    Yes [provider]  calcium carbonate (TUMS EX) 750 MG chewable tablet Chew 2 tablets by mouth daily.    Yes [provider]  Camphor-Menthol-Methyl Sal 06-09-28 % CREA Apply 1 application topically 3 (three) times daily as needed. Apply thin film of topical to pain site. Okay to leave on bed side.   Yes [provider]  diclofenac sodium (VOLTAREN) 1 % GEL Apply 4 g topically 4 (four) times daily. Apply 4 grams to the right knee QID for pain,   Yes [provider]  docusate sodium (COLACE) 100 MG capsule Take 1 capsule (100 mg total) by mouth daily as needed. 04/14/17 04/14/18 Yes McShane, Gerda Diss, MD  fluticasone (FLONASE) 50 MCG/ACT nasal spray Place 2 sprays into both nostrils daily.  11/19/15  Yes [provider]  fluticasone furoate-vilanterol (BREO ELLIPTA) 100-25 MCG/INH AEPB Inhale 1 puff into the lungs daily.    Yes [provider]  ipratropium-albuterol (DUONEB) 0.5-2.5 (3) MG/3ML SOLN Take 3 mLs by nebulization 3 (three) times daily.    Yes [provider]  lactulose (CHRONULAC) 10 GM/15ML solution Take 30 g by mouth 2 (two) times daily as needed.   Yes [provider]  losartan (COZAAR) 25 MG tablet Take 25 mg by mouth daily as needed (BP >150/90).    Yes [provider]  Nutritional Supplements (ENSURE ENLIVE PO) Take 1 Bottle by mouth daily.    Yes [provider]  nystatin-triamcinolone (MYCOLOG II) cream Apply 1 application topically 2 (two) times daily. apply nystatin cream under the right breast to treat yeast infection/irritation   Yes [provider]  pantoprazole (PROTONIX) 40 MG tablet Take 40 mg by mouth daily. before a meal to help with esophagus problems    Yes [provider]  polyethylene glycol (MIRALAX / GLYCOLAX) packet Take 17 g by mouth daily.   Yes [provider]  Probiotic Product (RISA-BID PROBIOTIC) TABS Take 1 tablet by mouth 2 (two) times daily.   Yes  [provider]  sodium phosphate (FLEET) 7-19 GM/118ML ENEM Place 1 enema rectally every three (3) days as needed for mild constipation or moderate constipation. Once every 3 days as needed    Yes [provider]  trimethoprim (TRIMPEX) 100 MG tablet Take 100 mg by mouth daily.    Yes [provider]  NON FORMULARY Diet Type - Regular    [provider]     ALLERGIES:  No Known Allergies   SOCIAL HISTORY:  Social History   Socioeconomic History  . Marital status: Widowed    Spouse name: Not on file  . Number of children: Not on file  . Years of education: 26  . Highest education level: Bachelor's degree (e.g., BA, AB, BS)  Occupational History  . Not on file  Social Needs  . Financial resource strain: Not on file  . Food insecurity:    Worry: Not on file    Inability: Not on file  . Transportation needs:    Medical: Not on file    Non-medical: Not on file  Tobacco Use  . Smoking status: Never Smoker  . Smokeless tobacco: Never Used  Substance and Sexual Activity  . Alcohol use: No  . Drug use: Never  . Sexual activity: Not on file  Lifestyle  . Physical activity:    Days per week: Not on file    Minutes per session: Not on file  . Stress: Not on file  Relationships  . Social connections:    Talks on phone: Not on file    Gets together: Not on file    Attends religious service: Not on file    Active member of club or organization: Not on file    Attends meetings of clubs or organizations: Not on file    Relationship status: Not on file  . Intimate partner violence:    Fear of current or ex partner: Not on file    Emotionally abused: Not on file    Physically abused: Not on file    Forced sexual activity: Not on file  Other Topics Concern  . Not on file  Social History Narrative   From assisted living facility, however after recent discharge from the hospital-discharge to short-term rehab   -Most transfers all through  wheelchair      Non smoker   No smokeless tobacco use   No alcohol use   Widowed   DNR with HCPOA and Living Will     The patient currently resides (home / rehab facility / nursing home): Assisted  Living Community ([facility]) The patient normally is (ambulatory / bedbound): Wheelchair   FAMILY HISTORY:  Family History  Problem Relation Age of Onset  . Breast cancer Daughter 75       passed at 69 from breast cancer  . Heart disease Mother   . Heart failure Mother   . Lung cancer Father      REVIEW OF SYSTEMS:  Constitutional: denies weight loss, fever, chills, or sweats  Eyes: denies any other vision changes, history of eye injury  ENT: denies sore throat, hearing problems  Respiratory: denies shortness of breath, wheezing  Cardiovascular: denies chest pain, palpitations  Gastrointestinal: denies abdominal pain, + N/V, denied diarrhea/and bowel function as per HPI Genitourinary: denies burning with urination or urinary frequency Musculoskeletal: denies any other joint pains or cramps  Skin: denies any other rashes or skin discolorations  Neurological: denies any other headache, dizziness, weakness  Psychiatric: denies any other depression, anxiety   All other review of systems were negative   VITAL SIGNS:  Temp:  [98.1 F (36.7 C)-98.6 F (37 C)] 98.6 F (37 C) (10/07 0900) Pulse Rate:  [71-85] 75 (10/07 0900) Resp:  [19-30] 24 (10/07 0800) BP: (144-154)/(78-102) 148/102 (10/07 0900) SpO2:  [94 %-96 %] 96 % (10/07 0900) Weight:  [68.9 kg] 68.9 kg (10/07 0558)     Height: 5' (152.4 cm) Weight: 68.9 kg BMI (Calculated): 29.67   INTAKE/OUTPUT:  This shift: No intake/output data recorded.  Last 2 shifts: @IOLAST2SHIFTS @   PHYSICAL EXAM:  Constitutional:  -- Normal body habitus  -- Awake, alert, and oriented x3, no apparent distress Eyes:  -- Pupils equally round and reactive to light  -- No scleral icterus, B/L no occular discharge Ear, nose, throat: -- Neck is  FROM WNL Pulmonary:  -- No wheezes or rhales -- Equal breath sounds bilaterally -- Breathing non-labored at rest Cardiovascular:  -- S1, S2 present  -- No pericardial rubs  Gastrointestinal:  -- Abdomen soft, nontender, non-distended, tympanic to percussion, + BS -- There is a large hernia in present in the RLQ, appreciable bowel on palpation which is firm in area, easily reducible Musculoskeletal and Integumentary:  -- Wounds or skin discoloration: None appreciated -- Extremities: B/L UE and LE FROM, hands and feet warm, no edema  Neurologic:  -- Motor function: Intact and symmetric -- Sensation: Intact and symmetric Psychiatric:  -- Mood and affect WNL   Labs:  CBC Latest Ref Rng & Units 12/06/2017 11/24/2017 10/01/2017  WBC 3.6 - 11.0 K/uL 32.7(H) 12.5(H) 11.7(H)  Hemoglobin 12.0 - 16.0 g/dL 11.9(L) 11.5(L) 12.5  Hematocrit 35.0 - 47.0 % 37.2 33.8(L) 37.7  Platelets 150 - 440 K/uL 527(H) 431 321   CMP Latest Ref Rng & Units 12/06/2017 11/24/2017 10/22/2017  Glucose 70 - 99 mg/dL 165(H) 117(H) 143(H)  BUN 8 - 23 mg/dL 21 26(H) 22  Creatinine 0.44 - 1.00 mg/dL 0.84 0.97 0.88  Sodium 135 - 145 mmol/L 133(L) 136 132(L)  Potassium 3.5 - 5.1 mmol/L 5.2(H) 4.4 4.4  Chloride 98 - 111 mmol/L 95(L) 99 98  CO2 22 - 32 mmol/L 29 28 25   Calcium 8.9 - 10.3 mg/dL 8.9 8.6(L) 8.7(L)  Total Protein 6.5 - 8.1 g/dL 6.6 5.7(L) -  Total Bilirubin 0.3 - 1.2 mg/dL 1.2 0.5 -  Alkaline Phos 38 - 126 U/L 65 60 -  AST 15 - 41 U/L 30 21 -  ALT 0 - 44 U/L 8 9 -     Imaging studies:   --KUB  10/07:   IMPRESSION: Probable dilated small bowel loop seen in right side of abdomen which may represent focal ileus or potentially small bowel obstruction. Potentially this bowel loop may be located within right lower quadrant hernia seen on prior CT scan. CT scan may be performed for further evaluation.  --CT Abdomen/Pelvis on 10/07  IMPRESSION: Large right lateral hernia is noted in right lower quadrant  of the abdomen which contains several loops of dilated small bowel, with dilatation of more proximal small bowel loops consistent with probable incarceration and obstruction. Moderate size sliding-type hiatal hernia. Sigmoid diverticulosis without inflammation. Mild urinary bladder distention. Cholelithiasis. Aortic Atherosclerosis (ICD10-I70.0)  . Assessment/Plan: (ICD-10's: K69.52) 84 y.o. female with chronic right ventral hernia easily reducible, complicated by pertinent comorbidities including COPD, CKD, HLD, HTN, Hypothyroidism, History of colon cancer s/p hemicolectomy, immobility, and advanced age.   - Discussed with Dr. Rosana Hoes, MD who discussed case with patient and her family extensively. This appears to be a large chronic hernia which is reducible. Question of obstruction is likely from adhesive disease from previous abdominal surgeries  - ED staff will place NGT and place to low intermittent suction  - NPO  - IVF  - Monitor leukocytosis  - Will order SMOG enema  - DVT prophylaxis  All of the above findings and recommendations were discussed with the patient and her family, and all of patient's and her family's questions were answered to their expressed satisfaction.  Thank you for the opportunity to participate in this patient's care.   -- Edison Simon, PA-C [facility] 12/06/2017, 10:54 AM [phone] M-F: 7am - 4pm

## 2017-12-07 ENCOUNTER — Inpatient Hospital Stay: Payer: Medicare Other

## 2017-12-07 LAB — BASIC METABOLIC PANEL
Anion gap: 9 (ref 5–15)
BUN: 19 mg/dL (ref 8–23)
CHLORIDE: 99 mmol/L (ref 98–111)
CO2: 29 mmol/L (ref 22–32)
CREATININE: 1.02 mg/dL — AB (ref 0.44–1.00)
Calcium: 8.2 mg/dL — ABNORMAL LOW (ref 8.9–10.3)
GFR calc Af Amer: 53 mL/min — ABNORMAL LOW (ref >60)
GFR calc non Af Amer: 46 mL/min — ABNORMAL LOW (ref >60)
Glucose, Bld: 131 mg/dL — ABNORMAL HIGH (ref 70–99)
Potassium: 4.1 mmol/L (ref 3.5–5.1)
SODIUM: 137 mmol/L (ref 135–145)

## 2017-12-07 LAB — CBC
HCT: 29.3 % — ABNORMAL LOW (ref 35.0–47.0)
Hemoglobin: 9.7 g/dL — ABNORMAL LOW (ref 12.0–16.0)
MCH: 28.1 pg (ref 26.0–34.0)
MCHC: 33.1 g/dL (ref 32.0–36.0)
MCV: 84.9 fL (ref 80.0–100.0)
PLATELETS: 465 10*3/uL — AB (ref 150–440)
RBC: 3.45 MIL/uL — ABNORMAL LOW (ref 3.80–5.20)
RDW: 15.6 % — AB (ref 11.5–14.5)
WBC: 15.5 10*3/uL — AB (ref 3.6–11.0)

## 2017-12-07 MED ORDER — SODIUM CHLORIDE 0.9 % IV SOLN
INTRAVENOUS | Status: DC | PRN
  Administered 2017-12-07 – 2017-12-10 (×5): 250 mL via INTRAVENOUS

## 2017-12-07 NOTE — Evaluation (Signed)
Physical Therapy Evaluation Patient Details Name: Beth Chen MRN: [id number] DOB: 08-29-1921 Today's Date: 12/07/2017   History of Present Illness  Pt is a 84 y.o female presenting with nausea and vomitting. NG tube placed on 12/06/17 for small bowel obstruction. Pt previously a long term resident at [facility] and is wheelchair bound. PMH significant for colon cancer, HTN, and asthma.   Clinical Impression  Upon arrival pt in bed with daughter at bedside. Pt willing and ready to work with PT this afternoon. Pt presenting with above diagnosis and since April has not been ambulatory. During strength testing generalized weakness noted but RLE overall significantly weaker than L with no reported reason. Pt able to demonstrate supine to sit with only min assist and excellent effort to bring trunk to upright. Mod assist needed to bring pt back to supine primarily for LE and positioning once in bed. Pt needs increased time and VC for success, but overall is eager to sit on edge of bed. Pt with no LOB or leaning in sitting. Pt motivated to attempt standing, however unable to attain sit to stand even with +2 total assist. Pt able to perform bed level and seated exercises (about 10 min) with good effort despite age appropriate weakness noted (however R LE continues to be weaker compared to L). Despite recent decline in status, pt demonstrating eagerness and potential to improve functionality of bed mobility and transfers with continued skilled PT in SNF.     Follow Up Recommendations SNF    Equipment Recommendations       Recommendations for Other Services       Precautions / Restrictions Precautions Precautions: Fall Restrictions Weight Bearing Restrictions: No      Mobility  Bed Mobility Overal bed mobility: Needs Assistance Bed Mobility: Sit to Supine;Supine to Sit     Supine to sit: Min assist Sit to supine: Mod assist   General bed mobility comments: Pt requiring min assist to achieve  truncal elevation from supine, giving good effort with BUE, VC to reach for bed rails, adequate sitting balance. Pt needing mod assist to bring BLE back to bed from sitting with assist needed to reposition in bed.   Transfers Overall transfer level: Needs assistance   Transfers: Sit to/from Stand Sit to Stand: Total assist;+2 physical assistance         General transfer comment: 2 person total assist for sit to stand attempt. Pt unable to achieve full standing with hips leaning posteriorly. No standing tolerated immediately going back to sitting following attempt. Pt requiring VC for hand positioning, LE placement, and to scoot hips forward to increase likelihood of success.   Ambulation/Gait                Stairs            Wheelchair Mobility    Modified Rankin (Stroke Patients Only)       Balance Overall balance assessment: Needs assistance Sitting-balance support: Feet supported;Single extremity supported Sitting balance-Leahy Scale: Fair Sitting balance - Comments: no physical assist required to maintain sitting balance 1 UE on bed and BLE on floor with no notable sway                                      Pertinent Vitals/Pain Pain Assessment: No/denies pain    Home Living Family/patient expects to be discharged to:: Skilled nursing facility  Prior Function Level of Independence: Needs assistance   Gait / Transfers Assistance Needed: Pt is not ambulatory since April and is currently wheelchair bound. Lift transfers from bed to chair.            Hand Dominance        Extremity/Trunk Assessment   Upper Extremity Assessment Upper Extremity Assessment: Generalized weakness(grossly >3/5 for all UE age appropriate deficits noted)    Lower Extremity Assessment Lower Extremity Assessment: Generalized weakness(grossly >3/5 for all L LE, R LE <3/5 with no pain but pt unabel to recall why R side is much  weaker)       Communication   Communication: No difficulties  Cognition Arousal/Alertness: Awake/alert Behavior During Therapy: WFL for tasks assessed/performed Overall Cognitive Status: Within Functional Limits for tasks assessed                                        General Comments      Exercises General Exercises - Lower Extremity Long Arc Quad: AROM;Both;10 reps;Seated Heel Slides: AAROM;Both;10 reps;Supine Hip ABduction/ADduction: AROM;Both;10 reps;Supine Hip Flexion/Marching: AROM;Both;10 reps;Supine;Seated   Assessment/Plan    PT Assessment Patient needs continued PT services  PT Problem List Decreased strength;Decreased range of motion;Decreased activity tolerance;Decreased balance;Decreased mobility;Decreased safety awareness;Decreased knowledge of use of DME       PT Treatment Interventions DME instruction;Balance training;Gait training;Functional mobility training;Patient/family education;Therapeutic activities;Therapeutic exercise    PT Goals (Current goals can be found in the Care Plan section)  Acute Rehab PT Goals Patient Stated Goal: improve strength and independency PT Goal Formulation: With patient/family Time For Goal Achievement: 12/21/17 Potential to Achieve Goals: Fair    Frequency Min 2X/week   Barriers to discharge        Co-evaluation               AM-PAC PT "6 Clicks" Daily Activity  Outcome Measure Difficulty turning over in bed (including adjusting bedclothes, sheets and blankets)?: Unable Difficulty moving from lying on back to sitting on the side of the bed? : Unable Difficulty sitting down on and standing up from a chair with arms (e.g., wheelchair, bedside commode, etc,.)?: Unable Help needed moving to and from a bed to chair (including a wheelchair)?: Total Help needed walking in hospital room?: Total Help needed climbing 3-5 steps with a railing? : Total 6 Click Score: 6    End of Session Equipment  Utilized During Treatment: Gait belt Activity Tolerance: Patient tolerated treatment well Patient left: in bed;with call bell/phone within reach;with bed alarm set;with family/visitor present Nurse Communication: Mobility status PT Visit Diagnosis: Unsteadiness on feet (R26.81);Muscle weakness (generalized) (M62.81);Difficulty in walking, not elsewhere classified (R26.2)    Time: 0321-2248 PT Time Calculation (min) (ACUTE ONLY): 29 min   Charges:   PT Evaluation $PT Eval Low Complexity: 1 Low PT Treatments $Therapeutic Exercise: 8-22 mins        Ernie Avena, SPT 12/07/2017, 4:34 PM

## 2017-12-07 NOTE — Progress Notes (Signed)
PA was made aware that pt.'s NGT is barely putting out any secretions  in the last 12 hours. Only 100 cc is noticed in canister at this time. NGT was flushed. New orders placed by PA. RN will continue to assess.   Charnita Trudel [employer]

## 2017-12-07 NOTE — Progress Notes (Signed)
[facility] [place] NAME: Beth Chen    MR#:  [id number]  DATE OF BIRTH:  10-Dec-1921  SUBJECTIVE:  CHIEF COMPLAINT: Patient is resting okay NG tube intact.  Daughter at bedside  REVIEW OF SYSTEMS:  CONSTITUTIONAL: No fever, fatigue or weakness.  EYES: No blurred or double vision.  EARS, NOSE, AND THROAT: No tinnitus or ear pain.  RESPIRATORY: No cough, shortness of breath, wheezing or hemoptysis.  CARDIOVASCULAR: No chest pain, orthopnea, edema.  GASTROINTESTINAL: Reports nausea, denies vomiting, diarrhea or abdominal pain.  GENITOURINARY: No dysuria, hematuria.  ENDOCRINE: No polyuria, nocturia,  HEMATOLOGY: No anemia, easy bruising or bleeding SKIN: No rash or lesion. MUSCULOSKELETAL: No joint pain or arthritis.   NEUROLOGIC: No tingling, numbness, weakness.  PSYCHIATRY: No anxiety or depression.   DRUG ALLERGIES:  No Known Allergies  VITALS:  Blood pressure 126/63, pulse 70, temperature 98 F (36.7 C), temperature source Oral, resp. rate 20, height 5' (1.524 m), weight 68.9 kg, SpO2 99 %.  PHYSICAL EXAMINATION:  GENERAL:  84 y.o.-year-old patient lying in the bed with no acute distress.  EYES: Pupils equal, round, reactive to light and accommodation. No scleral icterus. Extraocular muscles intact.  HEENT: Head atraumatic, normocephalic.  NG tube intact NECK:  Supple, no jugular venous distention. No thyroid enlargement, no tenderness.  LUNGS: Normal breath sounds bilaterally, no wheezing, rales,rhonchi or crepitation. No use of accessory muscles of respiration.  CARDIOVASCULAR: S1, S2 normal. No murmurs, rubs, or gallops.  ABDOMEN: Soft, minimal diffuse discomfort and minimal distention no bowel sounds.  EXTREMITIES: No pedal edema, cyanosis, or clubbing.  NEUROLOGIC: Arousable answers few questions gait not checked.  PSYCHIATRIC: The patient is alert and oriented x 2  SKIN: No obvious rash, lesion, or ulcer.     LABORATORY PANEL:   CBC Recent Labs  Lab 12/07/17 0427  WBC 15.5*  HGB 9.7*  HCT 29.3*  PLT 465*   ------------------------------------------------------------------------------------------------------------------  Chemistries  Recent Labs  Lab 12/06/17 0605 12/07/17 0427  NA 133* 137  K 5.2* 4.1  CL 95* 99  CO2 29 29  GLUCOSE 165* 131*  BUN 21 19  CREATININE 0.84 1.02*  CALCIUM 8.9 8.2*  AST 30  --   ALT 8  --   ALKPHOS 65  --   BILITOT 1.2  --    ------------------------------------------------------------------------------------------------------------------  Cardiac Enzymes Recent Labs  Lab 12/06/17 0605  TROPONINI <0.03   ------------------------------------------------------------------------------------------------------------------  RADIOLOGY:  Dg Abdomen 1 View  Result Date: 12/06/2017 CLINICAL DATA:  Vomiting. EXAM: ABDOMEN - 1 VIEW COMPARISON:  Radiographs and CT scan of April 14, 2017. FINDINGS: Large amount of stool seen throughout the colon. Probable dilated small bowel loop is seen in right side of the abdomen. No radio-opaque calculi or other significant radiographic abnormality are seen. IMPRESSION: Probable dilated small bowel loop seen in right side of abdomen which may represent focal ileus or potentially small bowel obstruction. Potentially this bowel loop may be located within right lower quadrant hernia seen on prior CT scan. CT scan may be performed for further evaluation. Electronically Signed   By: Marijo Conception, M.D.   On: 12/06/2017 07:48   Ct Abdomen Pelvis W Contrast  Result Date: 12/06/2017 CLINICAL DATA:  Acute right-sided abdominal pain. EXAM: CT ABDOMEN AND PELVIS WITH CONTRAST TECHNIQUE: Multidetector CT imaging of the abdomen and pelvis was performed using the standard protocol following bolus administration of intravenous contrast. CONTRAST:  28mL OMNIPAQUE IOHEXOL 300 MG/ML  SOLN  COMPARISON:  CT scan of April 14, 2017. FINDINGS: Lower chest: Moderate size sliding-type hiatal hernia is noted. Visualized lung bases are unremarkable. Hepatobiliary: Mild cholelithiasis is noted without inflammation. No biliary dilatation is noted. Liver is unremarkable. Pancreas: Unremarkable. No pancreatic ductal dilatation or surrounding inflammatory changes. Spleen: Normal in size without focal abnormality. Adrenals/Urinary Tract: Adrenal glands and kidneys appear normal. No hydronephrosis or renal obstruction is noted. No renal or ureteral calculi are noted. Urinary bladder is mildly distended. Stomach/Bowel: Large amount of stool seen in the descending colon and rectum. Diverticulosis of the sigmoid colon is noted without inflammation. Large right lateral hernia is noted in right lower quadrant which contains several dilated loops of small bowel, with dilatation of more proximal small bowel loops suggesting obstruction. Vascular/Lymphatic: Aortic atherosclerosis. No enlarged abdominal or pelvic lymph nodes. Reproductive: Status post hysterectomy. No adnexal masses. Other: No ascites is noted. Musculoskeletal: Old L1 compression fracture is noted. No acute osseous abnormality is noted. IMPRESSION: Large right lateral hernia is noted in right lower quadrant of the abdomen which contains several loops of dilated small bowel, with dilatation of more proximal small bowel loops consistent with probable incarceration and obstruction. Moderate size sliding-type hiatal hernia. Sigmoid diverticulosis without inflammation. Mild urinary bladder distention. Cholelithiasis. Aortic Atherosclerosis (ICD10-I70.0). Electronically Signed   By: Marijo Conception, M.D.   On: 12/06/2017 08:46   Dg Chest Portable 1 View  Result Date: 12/06/2017 CLINICAL DATA:  Wheezing.  Vomiting. EXAM: PORTABLE CHEST 1 VIEW COMPARISON:  Radiographs 07/13/2017 FINDINGS: Cardiomediastinal contours are unchanged with mild cardiomegaly and retrocardiac hiatal hernia. Coarse lung  markings appear chronic. Pulmonary vasculature is normal. No consolidation, pleural effusion, or pneumothorax. No acute osseous abnormalities are seen. Remote right rib fractures. IMPRESSION: No acute findings. Electronically Signed   By: Keith Rake M.D.   On: 12/06/2017 06:59   Dg Abd Portable 1v  Result Date: 12/07/2017 CLINICAL DATA:  Abdominal distension, check nasogastric catheter EXAM: PORTABLE ABDOMEN - 1 VIEW COMPARISON:  12/06/2017 FINDINGS: Nasogastric catheter is again noted in the distal stomach. Scattered large and small bowel gas is noted. Fecal material is noted throughout the colon. Changes consistent with the known right lateral abdominal wall hernia are again seen. The degree of small bowel dilatation has improved from the prior exam. No bony abnormality is noted. IMPRESSION: Improvement in small bowel dilatation when compare with the prior exam. Nasogastric catheter remains in the stomach. Electronically Signed   By: Inez Catalina M.D.   On: 12/07/2017 09:47   Dg Abd Portable 1 View  Result Date: 12/06/2017 CLINICAL DATA:  NG tube placement EXAM: PORTABLE ABDOMEN - 1 VIEW COMPARISON:  12/06/2017 CT FINDINGS: NG tube is in the distal stomach. Dilated right abdominal small bowel loops again noted. No free air. IMPRESSION: NG tube in the distal stomach. Electronically Signed   By: Rolm Baptise M.D.   On: 12/06/2017 11:44    EKG:   Orders placed or performed during the hospital encounter of 12/06/17  . EKG 12-Lead  . EKG 12-Lead  . ED EKG  . ED EKG    ASSESSMENT AND PLAN:   84 year old elderly female patient with CKD stage III, hypertension, COPD,, hyperlipidemia, large ventral hernia currently under surgical service for nausea and vomiting and abdominal distention  -Large ventral hernia with bowel obstruction Clinically feeling somewhat better Continue NG tube with suction Surgery follow-up Enemas being planned for bowel movement IV fluids and monitor  electrolytes N.p.o. for now Empiric IV Zosyn antibiotic  -  Emphysema Stable, nebulizer treatments as needed  -Hyperlipidemia-currently patient is n.p.o. in view of bowel obstruction  -Hypertensive chronic kidney disease stage III Monitor renal function closely and avoid nephrotoxins and NSAIDs  -GI prophylaxis with IV Pepcid  -DVT prophylaxis with subcu heparin  -CKD stage III Monitor renal function      All the records are reviewed and case discussed with Care Management/Social Workerr. Management plans discussed with the patient, family and they are in agreement.  CODE STATUS: fc  TOTAL TIME TAKING CARE OF THIS PATIENT: 35 minutes.   .  Note: This dictation was prepared with Dragon dictation along with smaller phrase technology. Any transcriptional errors that result from this process are unintentional.   Nicholes Mango M.D on 12/07/2017 at 11:59 PM  Between 7am to 6pm - Pager - [phone] After 6pm go to [email or web address] - password [email or web address] [facility]  [facility]  Office  [phone]  CC: Primary care physician; Kirk Ruths, MD

## 2017-12-07 NOTE — Progress Notes (Addendum)
SURGICAL PROGRESS NOTE (cpt: [phone])  Patient seen and examined as described below with surgical PA-C, Ardell Isaacs.  Assessment/Plan: (ICD-10's: K74.52) 84 y.o. female with complete small bowel obstruction, likely attributable to post-surgical adhesions following remote Right colectomy in [place] for colon cancer rather than attributable to chronic, wide-defect easily reducible (and easily reduced) RLQ incisional hernia with dilated bowel entering and exiting hernia on CT with non-dilated bowel within CT, complicated by much-improved leukocytosis and chronic constipation/fecal impaction and by pertinent comorbidities including advanced chronological age, HTN, HLD, CKD (stage 3), cardiac arrhythmia (Left bundle branch block), COPD, hypothyroidism, hiatal hernia with GERD, and osteopenia.  - NPO for now, IV fluids             - continue NG tube for nasogastric decompression             - monitor ongoing bowel function and abdominal exam              - anticipate symptomatic relief within 24 - 48 hours following NGT insertion, followed by "rumbling" the following day and flatus either the same day or the day following the "rumbling" with anticipated length of stay ~3 - 5 days with successful non-operative management for 8 of 10 patients with small bowel obstruction attributed to post-surgical adhesions  - surgical intervention if doesn't improve was also discussed  - unclear indication for antibiotics, discussed with hospitalist             - medical management comorbidities as per medical team  - frequent repositioning to reduce risk for pressure wounds             - out of bed to chair, unable to ambulate at baseline  - repeat enemas for constipation             - DVT prophylaxis  I have personally reviewed the patient's chart, evaluated/examined the patient, proposed the recommended management, and discussed these recommendations with the patient and her family to their expressed  satisfaction as well as with patient's RN and medical physician.  -- Marilynne Drivers Rosana Hoes, MD, [place]: [facility] - Partnering for exceptional care. Office: [facility] Day(s): 1.   Post op day(s):  Marland Kitchen   Interval History: Patient seen and examined, no acute events or new complaints overnight. Patient denies any abdominal pain, nuasea, or emesis. No fevers or chills. Her daughter is at bedside this morning. NGT in place. No complaints, patient asks frequently if she is going home this morning.   Review of Systems:  Constitutional: denies fever, chills  Respiratory: denies any shortness of breath  Cardiovascular: denies chest pain or palpitations  Gastrointestinal: denies abdominal pain, N/V, or diarrhea/and bowel function as per interval history Musculoskeletal: denies pain, decreased motor or sensation Integumentary: denies any other rashes or skin discolorations  Vital signs in last 24 hours: [min-max] current  Temp:  [97.9 F (36.6 C)-98.6 F (37 C)] 98 F (36.7 C) (10/08 0456) Pulse Rate:  [73-79] 73 (10/08 0456) Resp:  [14-27] 18 (10/08 0456) BP: (113-148)/(66-102) 148/76 (10/08 0456) SpO2:  [95 %-100 %] 100 % (10/08 0456)     Height: 5' (152.4 cm) Weight: 68.9 kg BMI (Calculated): 29.67   Intake/Output this shift:  No intake/output data recorded.   Intake/Output last 2 shifts:  @IOLAST2SHIFTS @   Physical Exam:  Constitutional: alert, cooperative and no distress  Respiratory: breathing non-labored at rest  Cardiovascular: regular  rate and sinus rhythm  Gastrointestinal: soft, non-tender, mild distension, large right incisional hernia which is easily reducible Integumentary: well healed previous midline laparotomy scar present   Labs:  CBC Latest Ref Rng & Units 12/07/2017 12/06/2017 11/24/2017  WBC 3.6 - 11.0 K/uL 15.5(H) 32.7(H) 12.5(H)  Hemoglobin 12.0 - 16.0 g/dL 9.7(L) 11.9(L) 11.5(L)   Hematocrit 35.0 - 47.0 % 29.3(L) 37.2 33.8(L)  Platelets 150 - 440 K/uL 465(H) 527(H) 431   CMP Latest Ref Rng & Units 12/07/2017 12/06/2017 11/24/2017  Glucose 70 - 99 mg/dL 131(H) 165(H) 117(H)  BUN 8 - 23 mg/dL 19 21 26(H)  Creatinine 0.44 - 1.00 mg/dL 1.02(H) 0.84 0.97  Sodium 135 - 145 mmol/L 137 133(L) 136  Potassium 3.5 - 5.1 mmol/L 4.1 5.2(H) 4.4  Chloride 98 - 111 mmol/L 99 95(L) 99  CO2 22 - 32 mmol/L 29 29 28   Calcium 8.9 - 10.3 mg/dL 8.2(L) 8.9 8.6(L)  Total Protein 6.5 - 8.1 g/dL - 6.6 5.7(L)  Total Bilirubin 0.3 - 1.2 mg/dL - 1.2 0.5  Alkaline Phos 38 - 126 U/L - 65 60  AST 15 - 41 U/L - 30 21  ALT 0 - 44 U/L - 8 9    Imaging studies: No new pertinent imaging studies  Assessment/Plan: (ICD-10's: K56.52) 84 y.o.femalewith completevs partialsmall bowel obstruction, likely attributable to post-surgical adhesions following prior remote Right colectomy with primary anastamosis in [place] for colon cancer rather than attributable to chronic, wide-defect easily reducible (and easily reduced) RLQ incisional hernia with dilated bowel entering and exiting hernia on CT with non-dilated bowel within CT, complicated byleukocytosis and chronic constipation/fecal impaction and bypertinent comorbidities includingadvanced chronological age, HTN, HLD, CKD (stage 3), cardiac arrhythmia (Left bundle branch block), COPD, hypothyroidism, hiatal hernia with GERD, and osteopenia.   - Continue NGT today, NPO, IVF   - Continue serial abdominal exams and monitor for bowel function. Surgical intervention will be discussed if she does not show improvement  - Leukocytosis improved to 15.5 (from 32.7)  - Enema for constipation  - Encouraged mobilization  - DVT prophylaxis  - Medicine consult, appreciate their help  All of the above findings and recommendations were discussed with the patient, patient's family, and the medical team, and all of patient's and family's questions were answered to their  expressed satisfaction.  Thank you for the opportunity to participate in this patient's care.  -- Edison Simon, PA-C [facility] 12/07/2017, 8:11 AM [phone] M-F: 7am - 4pm

## 2017-12-08 ENCOUNTER — Inpatient Hospital Stay: Payer: Medicare Other

## 2017-12-08 LAB — CBC
HCT: 29.9 % — ABNORMAL LOW (ref 36.0–46.0)
HEMOGLOBIN: 9.3 g/dL — AB (ref 12.0–15.0)
MCH: 27.1 pg (ref 26.0–34.0)
MCHC: 31.1 g/dL (ref 30.0–36.0)
MCV: 87.2 fL (ref 80.0–100.0)
NRBC: 0 % (ref 0.0–0.2)
PLATELETS: 429 10*3/uL — AB (ref 150–400)
RBC: 3.43 MIL/uL — AB (ref 3.87–5.11)
RDW: 14.8 % (ref 11.5–15.5)
WBC: 10.8 10*3/uL — AB (ref 4.0–10.5)

## 2017-12-08 LAB — BASIC METABOLIC PANEL
Anion gap: 7 (ref 5–15)
BUN: 12 mg/dL (ref 8–23)
CALCIUM: 8 mg/dL — AB (ref 8.9–10.3)
CO2: 27 mmol/L (ref 22–32)
Chloride: 101 mmol/L (ref 98–111)
Creatinine, Ser: 0.97 mg/dL (ref 0.44–1.00)
GFR calc non Af Amer: 48 mL/min — ABNORMAL LOW (ref >60)
GFR, EST AFRICAN AMERICAN: 56 mL/min — AB (ref >60)
Glucose, Bld: 90 mg/dL (ref 70–99)
POTASSIUM: 3.7 mmol/L (ref 3.5–5.1)
SODIUM: 135 mmol/L (ref 135–145)

## 2017-12-08 LAB — URINE CULTURE: Culture: >=100000 — AB

## 2017-12-08 MED ORDER — INFLUENZA VAC SPLIT HIGH-DOSE 0.5 ML IM SUSY
0.5000 mL | PREFILLED_SYRINGE | INTRAMUSCULAR | Status: AC
  Administered 2017-12-09: 0.5 mL via INTRAMUSCULAR
  Filled 2017-12-08: qty 0.5

## 2017-12-08 NOTE — Progress Notes (Signed)
[facility] at [place] NAME: Beth Chen    MR#:  [id number]  DATE OF BIRTH:  02-19-1922  SUBJECTIVE:  CHIEF COMPLAINT: Patient pulled out NG tube yesterday evening Daughter at bedside No bowel movement yet  REVIEW OF SYSTEMS:  CONSTITUTIONAL: No fever, fatigue or weakness.  EYES: No blurred or double vision.  EARS, NOSE, AND THROAT: No tinnitus or ear pain.  RESPIRATORY: No cough, shortness of breath, wheezing or hemoptysis.  CARDIOVASCULAR: No chest pain, orthopnea, edema.  GASTROINTESTINAL: Reports nausea, denies vomiting, diarrhea or abdominal pain.  GENITOURINARY: No dysuria, hematuria.  ENDOCRINE: No polyuria, nocturia,  HEMATOLOGY: No anemia, easy bruising or bleeding SKIN: No rash or lesion. MUSCULOSKELETAL: No joint pain or arthritis.   NEUROLOGIC: No tingling, numbness, weakness.  PSYCHIATRY: No anxiety or depression.   DRUG ALLERGIES:  No Known Allergies  VITALS:  Blood pressure 131/75, pulse 69, temperature 97.8 F (36.6 C), temperature source Oral, resp. rate 16, height 5' (1.524 m), weight 68.9 kg, SpO2 97 %.  PHYSICAL EXAMINATION:  GENERAL:  84 y.o.-year-old patient lying in the bed with no acute distress.  EYES: Pupils equal, round, reactive to light and accommodation. No scleral icterus. Extraocular muscles intact.  HEENT: Head atraumatic, normocephalic.  NG tube intact NECK:  Supple, no jugular venous distention. No thyroid enlargement, no tenderness.  LUNGS: Normal breath sounds bilaterally, no wheezing, rales,rhonchi or crepitation. No use of accessory muscles of respiration.  CARDIOVASCULAR: S1, S2 normal. No murmurs, rubs, or gallops.  ABDOMEN: Soft, minimal diffuse discomfort and distentded no bowel sounds.  EXTREMITIES: No pedal edema, cyanosis, or clubbing.  NEUROLOGIC: Arousable answers few questions gait not checked.  PSYCHIATRIC: The patient is alert and oriented x 2  SKIN: No obvious rash, lesion,  or ulcer.    LABORATORY PANEL:   CBC Recent Labs  Lab 12/08/17 0449  WBC 10.8*  HGB 9.3*  HCT 29.9*  PLT 429*   ------------------------------------------------------------------------------------------------------------------  Chemistries  Recent Labs  Lab 12/06/17 0605  12/08/17 0449  NA 133*   < > 135  K 5.2*   < > 3.7  CL 95*   < > 101  CO2 29   < > 27  GLUCOSE 165*   < > 90  BUN 21   < > 12  CREATININE 0.84   < > 0.97  CALCIUM 8.9   < > 8.0*  AST 30  --   --   ALT 8  --   --   ALKPHOS 65  --   --   BILITOT 1.2  --   --    < > = values in this interval not displayed.   ------------------------------------------------------------------------------------------------------------------  Cardiac Enzymes Recent Labs  Lab 12/06/17 0605  TROPONINI <0.03   ------------------------------------------------------------------------------------------------------------------  RADIOLOGY:  Dg Abd Portable 1v  Result Date: 12/07/2017 CLINICAL DATA:  Abdominal distension, check nasogastric catheter EXAM: PORTABLE ABDOMEN - 1 VIEW COMPARISON:  12/06/2017 FINDINGS: Nasogastric catheter is again noted in the distal stomach. Scattered large and small bowel gas is noted. Fecal material is noted throughout the colon. Changes consistent with the known right lateral abdominal wall hernia are again seen. The degree of small bowel dilatation has improved from the prior exam. No bony abnormality is noted. IMPRESSION: Improvement in small bowel dilatation when compare with the prior exam. Nasogastric catheter remains in the stomach. Electronically Signed   By: Inez Catalina M.D.   On: 12/07/2017 09:47    EKG:  Orders placed or performed during the hospital encounter of 12/06/17  . EKG 12-Lead  . EKG 12-Lead  . ED EKG  . ED EKG    ASSESSMENT AND PLAN:   84 year old elderly female patient with CKD stage III, hypertension, COPD,, hyperlipidemia, large ventral hernia currently under  surgical service for nausea and vomiting and abdominal distention  -Large ventral hernia with bowel obstruction Surgery follow-up appreciated NG tube will be reinserted with sitter so that patient will not pull out the tube Surgery follow-up IV fluids and monitor electrolytes N.p.o. for now Empiric IV Zosyn antibiotic  -Emphysema Stable, nebulizer treatments as needed  -Hyperlipidemia-currently patient is n.p.o. in view of bowel obstruction  -Hypertensive chronic kidney disease stage III Monitor renal function closely and avoid nephrotoxins and NSAIDs  -GI prophylaxis with IV Pepcid  -DVT prophylaxis with subcu heparin  -CKD stage III Monitor renal function      All the records are reviewed and case discussed with Care Management/Social Workerr. Management plans discussed with the patient, family and they are in agreement.  CODE STATUS: fc  TOTAL TIME TAKING CARE OF THIS PATIENT: 32 minutes.   .  Note: This dictation was prepared with Dragon dictation along with smaller phrase technology. Any transcriptional errors that result from this process are unintentional.   [name] M.D on 12/08/2017 at 11:53 AM  Between 7am to 6pm - Pager - [phone] After 6pm go to [email or web address] - password [email or web address] [facility]  [facility]  Office  [phone]  CC: Primary care physician; Kirk Ruths, MD

## 2017-12-08 NOTE — Progress Notes (Signed)
Dr Peyton Najjar notified pt pulled out NG tube, orders given to hold at this time, if pt is nauseated replace tonight

## 2017-12-08 NOTE — Progress Notes (Signed)
PT Cancellation Note  Patient Details Name: Beth Chen MRN: [id number] DOB: Jan 16, 1922   Cancelled Treatment:    Reason Eval/Treat Not Completed: Patient declined, no reason specified.  Nurse reports pt recently returned from off floor (to place NG tube) and has had a busy day trying to get NG tube placed.  Pt reports being too tired to participate in therapy today (d/t above).  Will re-attempt PT treatment session at a later date/time.  Leitha Bleak, PT 12/08/17, 2:27 PM [phone]

## 2017-12-08 NOTE — Progress Notes (Addendum)
SURGICAL PROGRESS NOTE (cpt: [phone])  Patient seen and examined as described below with surgical PA-C, Ardell Isaacs.  Assessment/Plan: (ICD-10's: K73.52) 84 y.o. femalewith complete small bowel obstruction, likely attributable to post-surgical adhesions following remote Right colectomy in [place] for colon cancer rather than attributable to chronic wide-defect easily reducible (and easily reduced) RLQ incisional hernia with dilated bowel entering and exiting hernia on CT with non-dilated bowel within CT, complicated bymuch-improved leukocytosis and chronic constipation/fecal impaction and bypertinent comorbidities includingadvanced chronological age, HTN, HLD, CKD (stage 3), cardiac arrhythmia (Left bundle branch block), COPD, hypothyroidism, hiatal hernia with GERD, and osteopenia.  - NPO for now, IV fluids - replace NG tube for nasogastric decompression  - discussed with patient and her daughter that interruption of decompression can delay recovery and restart time to decompression - monitor ongoing bowel function and abdominal exam  - anticipate symptomatic relief within 24 - 48 hours following NGT insertion, followed by "rumbling" the following day and flatus either the same day or the day following the "rumbling" with anticipated length of stay ~3 - 5 days with successful non-operative management for 8 of 10 patients with small bowel obstruction attributed to post-surgical adhesions             - surgical intervention if doesn't improve was also discussed, though patient and her daughter express uncertainty whether even willing to consider surgery if SBO not improving despite non-operative management - antibiotics (?UTI) and medical management comorbidities as per medical team             - frequent repositioning to reduce risk for pressure wounds - out of bed to chair, unable to ambulate at baseline             -  repeat enemas for constipation - DVT prophylaxis  I have personally reviewed the patient's chart, evaluated/examined the patient, proposed the recommended management, and discussed these recommendations with the patient and her family to their expressed satisfaction as well as with patient's RN and medical physician.  -- Marilynne Drivers Rosana Hoes, MD, [place]: [facility] - Partnering for exceptional care. Office: [phone]       SURGICAL PROGRESS NOTE (cpt [phone])  Hospital Day(s): 2.   Interval History: Patient seen and examined. Patient removed her NGT and IVs overnight.   This morning, she notes that she is feeling pretty good and is without abdominal pain, nausea, or emesis. Denied passing flatus and no BM since the enema. No fever or chills.   Review of Systems:  Constitutional: denies fever, chills  HEENT: denies cough or congestion  Respiratory: denies any shortness of breath  Cardiovascular: denies chest pain or palpitations  Gastrointestinal: denies abdominal pain, N/V, or diarrhea/and bowel function as per interval history Genitourinary: denies burning with urination or urinary frequency Musculoskeletal: denies pain, decreased motor or sensation Integumentary: denies any other rashes or skin discolorations Neurological: denies HA or vision/hearing changes   Vital signs in last 24 hours: [min-max] current  Temp:  [97.6 F (36.4 C)-98.5 F (36.9 C)] 97.9 F (36.6 C) (10/09 0740) Pulse Rate:  [64-71] 64 (10/09 0740) Resp:  [16-20] 16 (10/09 0457) BP: (126-154)/(62-80) 141/62 (10/09 0740) SpO2:  [96 %-100 %] 98 % (10/09 0740)     Height: 5' (152.4 cm) Weight: 68.9 kg BMI (Calculated): 29.67   Intake/Output this shift:  No intake/output data recorded.   Intake/Output last 2 shifts:  @IOLAST2SHIFTS @   Physical Exam:  Constitutional: alert, cooperative and no distress  HENT:  normocephalic without obvious abnormality   Eyes: PERRL, EOM's grossly intact and symmetric  Respiratory: breathing non-labored at rest  Cardiovascular: regular rate and sinus rhythm  Gastrointestinal: soft, non-tender, remains distended, tympanic to percussion, right lateral incision hernia present, reducible   Labs:  CBC Latest Ref Rng & Units 12/08/2017 12/07/2017 12/06/2017  WBC 4.0 - 10.5 K/uL 10.8(H) 15.5(H) 32.7(H)  Hemoglobin 12.0 - 15.0 g/dL 9.3(L) 9.7(L) 11.9(L)  Hematocrit 36.0 - 46.0 % 29.9(L) 29.3(L) 37.2  Platelets 150 - 400 K/uL 429(H) 465(H) 527(H)   CMP Latest Ref Rng & Units 12/08/2017 12/07/2017 12/06/2017  Glucose 70 - 99 mg/dL 90 131(H) 165(H)  BUN 8 - 23 mg/dL 12 19 21   Creatinine 0.44 - 1.00 mg/dL 0.97 1.02(H) 0.84  Sodium 135 - 145 mmol/L 135 137 133(L)  Potassium 3.5 - 5.1 mmol/L 3.7 4.1 5.2(H)  Chloride 98 - 111 mmol/L 101 99 95(L)  CO2 22 - 32 mmol/L 27 29 29   Calcium 8.9 - 10.3 mg/dL 8.0(L) 8.2(L) 8.9  Total Protein 6.5 - 8.1 g/dL - - 6.6  Total Bilirubin 0.3 - 1.2 mg/dL - - 1.2  Alkaline Phos 38 - 126 U/L - - 65  AST 15 - 41 U/L - - 30  ALT 0 - 44 U/L - - 8    Imaging studies: No new pertinent imaging studies   Assessment/Plan: (ICD-10's: K56.52) 84 y.o. femalewith complete small bowel obstruction, likely attributable to post-surgical adhesions following remote Right colectomy in [place] for colon cancer rather than attributable to chronic, wide-defect easily reducible (and easily reduced) RLQ incisional hernia with dilated bowel entering and exiting hernia on CT with non-dilated bowel within CT, complicated bymuch-improved leukocytosis and chronic constipation/fecal impaction and bypertinent comorbidities includingadvanced chronological age, HTN, HLD, CKD (stage 3), cardiac arrhythmia (Left bundle branch block), COPD, hypothyroidism, hiatal hernia with GERD, and osteopenia   - Need to re-insert NG tube for nasogastric decompression, will get bedside sitter when daughter is not present to prevent  removal  - NPO for now, IV fluids - monitor ongoing bowel function and abdominal exam  - anticipate symptomatic relief within 24 - 48 hours following NGT insertion, followed by "rumbling" the following day and flatus either the same day or the day following the "rumbling" with anticipated length of stay ~3 - 5 days with successful non-operative management for 8 of 10 patients with small bowel obstruction attributed to post-surgical adhesions             - surgical intervention if doesn't improve was also discussed             - Antibiotics for improving leukocytosis, discussed with Hospitalist. Will trend leukocytosis - medical management comorbidities as per medical team             - frequent repositioning to reduce risk for pressure wounds - out of bed to chair, unable to ambulate at baseline, PT following             - repeat enemas for constipation - DVT prophylaxis  All of the above findings and recommendations were discussed with the patient, patient's family, and the medical team, and all of patient's and family's questions were answered to their expressed satisfaction.  Thank you for the opportunity to participate in this patient's care.  -- Edison Simon, PA-C [facility] 12/08/2017, 10:15 AM [phone] M-F: 7am - 4pm

## 2017-12-08 NOTE — Progress Notes (Signed)
Pt removd IV, attempt x 2 by IV team unsuccessful, Pt refused to be stuck again, will attempt at later time

## 2017-12-09 LAB — BASIC METABOLIC PANEL
Anion gap: 8 (ref 5–15)
BUN: 8 mg/dL (ref 8–23)
CALCIUM: 8.1 mg/dL — AB (ref 8.9–10.3)
CO2: 27 mmol/L (ref 22–32)
CREATININE: 0.99 mg/dL (ref 0.44–1.00)
Chloride: 101 mmol/L (ref 98–111)
GFR calc non Af Amer: 47 mL/min — ABNORMAL LOW (ref >60)
GFR, EST AFRICAN AMERICAN: 55 mL/min — AB (ref >60)
Glucose, Bld: 122 mg/dL — ABNORMAL HIGH (ref 70–99)
Potassium: 3.2 mmol/L — ABNORMAL LOW (ref 3.5–5.1)
SODIUM: 136 mmol/L (ref 135–145)

## 2017-12-09 MED ORDER — KCL-LACTATED RINGERS-D5W 20 MEQ/L IV SOLN
INTRAVENOUS | Status: AC
  Administered 2017-12-09 – 2017-12-10 (×3): via INTRAVENOUS
  Filled 2017-12-09 (×5): qty 1000

## 2017-12-09 NOTE — Care Management Important Message (Signed)
Copy of signed IM left with patient in room.  

## 2017-12-09 NOTE — Progress Notes (Addendum)
SURGICAL PROGRESS NOTE(cpt: [phone])  Patient seen and examined as described below with surgical PA-C, Ardell Isaacs.  Assessment/Plan: (ICD-10's: K56.52) 84 y.o.femalewith complete small bowel obstruction, likely attributable to post-surgical adhesions followingremote Right colectomyin [place] for colon cancer, not likely attributable to chronic wide-defect easily reducible (and easily reduced) RLQ incisional hernia with dilated bowel both entering and exiting hernia on CT + non-dilated bowel within CT, complicated byresolvedleukocytosis, chronic constipation, and comorbidities includingadvanced chronological age, HTN, HLD, CKD (stage 3), chronic cardiac arrhythmia (Left bundle branch block), COPD, hypothyroidism, hiatal hernia with GERD, and osteopenia.  - NPO for now, IV fluids, correct electrolytes - monitor ongoing bowel function and abdominal exam  -NG tube for nasogastric decompression, though recovery delayed by patient removal of NG tube yesterday - unclear whether patient's daughter is willing to consider surgery if SBO not improving despite NG tube - antibiotics (?UTI) and medical management comorbidities as per medical team - frequent repositioning to reduce risk for pressure wounds -out of bed to chair, unable to ambulate at baseline  - may benefit from palliative care consult - DVT prophylaxis  I have personally reviewed the patient's chart, evaluated/examined the patient, proposed the recommended management, and discussed these recommendations with the patient and her daughter to their expressed satisfaction as well as with patient's RN and medical physician.  -- Marilynne Drivers Rosana Hoes, MD, [place]: [facility] - Partnering for exceptional care. Office: [phone]      SURGICAL PROGRESS NOTE (cpt [phone])  Hospital Day(s): 3.   Post  op day(s):  Marland Kitchen   Interval History: Patient seen and examined, no acute events or new complaints overnight.  Safety sitter at bedside. Patient denied abdominal pain, nausea, or emesis. NGT replaced yesterday. No reports of flatus. Patient continues to repeat "I feel fine, when can I go home?" and "Why are you here?" which limits history.   Review of Systems:  Constitutional: denies fever, chills  HEENT: denies cough or congestion  Respiratory: denies any shortness of breath  Cardiovascular: denies chest pain or palpitations  Gastrointestinal: denies abdominal pain, N/V, or diarrhea/and bowel function as per interval history Genitourinary: denies burning with urination or urinary frequency Musculoskeletal: denies pain, decreased motor or sensation Integumentary: denies any other rashes or skin discolorations Neurological: denies HA or vision/hearing changes   Vital signs in last 24 hours: [min-max] current  Temp:  [97.8 F (36.6 C)-97.9 F (36.6 C)] 97.8 F (36.6 C) (10/10 0506) Pulse Rate:  [63-69] 63 (10/10 0506) Resp:  [18] 18 (10/10 0506) BP: (131-149)/(62-75) 149/73 (10/10 0506) SpO2:  [96 %-98 %] 96 % (10/10 0506)     Height: 5' (152.4 cm) Weight: 68.9 kg BMI (Calculated): 29.67   Intake/Output this shift:  No intake/output data recorded.   Intake/Output last 2 shifts:  @IOLAST2SHIFTS @   Physical Exam:  Constitutional: alert, cooperative and no distress, safety sitter at bedside HENT: normocephalic without obvious abnormality  Eyes: PERRL, EOM's grossly intact and symmetric  Respiratory: breathing non-labored at rest  Cardiovascular: regular rate and sinus rhythm  Gastrointestinal: soft, non-tender, distended, large right lateral incisional hernia which is easily reducible Musculoskeletal: no edema or wounds, motor and sensation grossly intact, NT  Neuro: Frequently repeating the same questions.  Labs:  CBC Latest Ref Rng & Units 12/08/2017 12/07/2017 12/06/2017  WBC 4.0  - 10.5 K/uL 10.8(H) 15.5(H) 32.7(H)  Hemoglobin 12.0 - 15.0 g/dL 9.3(L) 9.7(L) 11.9(L)  Hematocrit 36.0 - 46.0 % 29.9(L) 29.3(L) 37.2  Platelets 150 - 400 K/uL  429(H) 465(H) 527(H)   CMP Latest Ref Rng & Units 12/09/2017 12/08/2017 12/07/2017  Glucose 70 - 99 mg/dL 122(H) 90 131(H)  BUN 8 - 23 mg/dL 8 12 19   Creatinine 0.44 - 1.00 mg/dL 0.99 0.97 1.02(H)  Sodium 135 - 145 mmol/L 136 135 137  Potassium 3.5 - 5.1 mmol/L 3.2(L) 3.7 4.1  Chloride 98 - 111 mmol/L 101 101 99  CO2 22 - 32 mmol/L 27 27 29   Calcium 8.9 - 10.3 mg/dL 8.1(L) 8.0(L) 8.2(L)  Total Protein 6.5 - 8.1 g/dL - - -  Total Bilirubin 0.3 - 1.2 mg/dL - - -  Alkaline Phos 38 - 126 U/L - - -  AST 15 - 41 U/L - - -  ALT 0 - 44 U/L - - -   Imaging studies: No new pertinent imaging studies  Assessment/Plan: (ICD-10's: K56.52) 84 y.o.femalewith complete small bowel obstruction for which NGT needed to be replaced yesterday after being removed by the patient, likely attributable to post-surgical adhesions followingremote Right colectomyin [place] for colon cancer rather than attributable to chronic wide-defect easily reducible (and easily reduced) RLQ incisional hernia with dilated bowel entering and exiting hernia on CT with non-dilated bowel within CT, complicated bymuch-improvedleukocytosis, chronic constipation/fecal impaction, and new hypokalemia and bypertinent comorbidities includingadvanced chronological age, HTN, HLD, CKD (stage 3), cardiac arrhythmia (Left bundle branch block), COPD, hypothyroidism, hiatal hernia with GERD, and osteopenia   - Continue NGT, NPO, IVF, safety sitter/mittens to ensure NGT remains in place  - Monitor ongoing bowel function and abdominal exam  - Anticipate symptomatic relief within 24 - 48 hours following NGT insertion, followed by "rumbling" the following day and flatus either the same day or the day following the "rumbling" with anticipated length of stay ~3 - 5 days with  successful non-operative management for 8 of 10 patients with small bowel obstruction attributed to post-surgical adhesions - Surgical intervention if doesn't improve was also discussed, this morning the patient's daughter (DPOA) feels that they would decline surgical intervention if it were to arise, but she is going to discuss this further with the patient - Antibiotics (?UTI) and medical management comorbidities as per medical team  - Replete potassium today and trend BMP - Frequent repositioning to reduce risk for pressure wounds -Out of bed to chair, unable to ambulate at baseline, PT following - Repeat enemas for constipation - DVT prophylaxis  All of the above findings and recommendations were discussed with the patient, patient's family, and the medical team, and all of patient's and family's questions were answered to their expressed satisfaction.  Thank you for the opportunity to participate in this patient's care.  -- Edison Simon, PA-C [facility] 12/09/2017, 7:35 AM [phone] M-F: 7am - 4pm

## 2017-12-09 NOTE — Progress Notes (Signed)
Physical Therapy Treatment Patient Details Name: Beth Chen MRN: [id number] DOB: [birth year] Today's Date: 12/09/2017    History of Present Illness Pt is a 84 y.o female presenting with nausea and vomitting. NG tube placed on 12/06/17 for small bowel obstruction. Pt previously a long term resident at [facility] and is wheelchair bound. PMH significant for colon cancer, HTN, and asthma.     PT Comments    Pt with good effort eager to continue with exercises and mobility training. Pt feeling much better today with less fatigue. Bed mobility improved with no physical assist needed to perform supine to sit. Pt eager to attempt sit to stand however unable to give much physical effort and continues to require total assist +2 to achieve standing. Pt continues to demonstrate weakness on R > L LE but is able to tolerate all exercises well. Pt continues to remain most appropriate for STR to progress bed mobility training and strength to improve independence with basic transfers and QOL.   Follow Up Recommendations  SNF     Equipment Recommendations       Recommendations for Other Services       Precautions / Restrictions Precautions Precautions: Fall Restrictions Weight Bearing Restrictions: No    Mobility  Bed Mobility Overal bed mobility: Needs Assistance Bed Mobility: Supine to Sit;Sit to Supine     Supine to sit: Min guard Sit to supine: Mod assist   General bed mobility comments: Pt going from supine to sit faster and with good overall effort. No physical assist required, only assist needed to scoot hips to edge of bed to prepare for standing. Mod assist needed to bring LE on to bed and position trunk during sit to supine.   Transfers Overall transfer level: Needs assistance Equipment used: Rolling walker (2 wheeled) Transfers: Sit to/from Stand Sit to Stand: Total assist;+2 physical assistance         General transfer comment: Pt with good effort however 2 person total  assist required during two trials for sit to stand. Pt unable to achieve full standing, tipping RW, feet sliding forward, leaning posteriorly, and unable to achieve full standing despite heavy +2 assist  Ambulation/Gait                 Stairs             Wheelchair Mobility    Modified Rankin (Stroke Patients Only)       Balance Overall balance assessment: Needs assistance Sitting-balance support: Feet supported;Single extremity supported Sitting balance-Leahy Scale: Fair Sitting balance - Comments: pt leaning posteriorly during seated exercises requiring min assist regain upright however during no challenge pt independent with sitting                                    Cognition Arousal/Alertness: Awake/alert Behavior During Therapy: WFL for tasks assessed/performed Overall Cognitive Status: Within Functional Limits for tasks assessed                                        Exercises General Exercises - Lower Extremity Short Arc Quad: AROM;Both;10 reps;Supine Long Arc Quad: AROM;Both;10 reps;Seated Hip ABduction/ADduction: AROM;Both;10 reps;Supine Straight Leg Raises: AAROM;Both;10 reps;Supine Hip Flexion/Marching: AROM;Both;10 reps;Supine;Seated    General Comments        Pertinent Vitals/Pain Pain Assessment: No/denies pain  Home Living                      Prior Function            PT Goals (current goals can now be found in the care plan section) Progress towards PT goals: Progressing toward goals    Frequency    Min 2X/week      PT Plan Current plan remains appropriate    Co-evaluation              AM-PAC PT "6 Clicks" Daily Activity  Outcome Measure  Difficulty turning over in bed (including adjusting bedclothes, sheets and blankets)?: A Little Difficulty moving from lying on back to sitting on the side of the bed? : A Lot Difficulty sitting down on and standing up from a chair with  arms (e.g., wheelchair, bedside commode, etc,.)?: Unable Help needed moving to and from a bed to chair (including a wheelchair)?: Total Help needed walking in hospital room?: Total Help needed climbing 3-5 steps with a railing? : Total 6 Click Score: 9    End of Session Equipment Utilized During Treatment: Gait belt Activity Tolerance: Patient tolerated treatment well Patient left: in bed;with call bell/phone within reach;with bed alarm set;with family/visitor present Nurse Communication: Mobility status PT Visit Diagnosis: Unsteadiness on feet (R26.81);Muscle weakness (generalized) (M62.81);Difficulty in walking, not elsewhere classified (R26.2)     Time: 1010-1035 PT Time Calculation (min) (ACUTE ONLY): 25 min  Charges:  $Therapeutic Exercise: 8-22 mins $Therapeutic Activity: 8-22 mins                     Ernie Avena, SPT 12/09/2017, 2:56 PM

## 2017-12-09 NOTE — Consult Note (Signed)
Consultation Note Date: 12/09/2017   Patient Name: Beth Chen  DOB: 12-11-1921  MRN: [id number]  Age / Sex: 84 y.o., female  PCP: Kirk Ruths, MD Referring Physician: Vickie Epley, MD  Reason for Consultation: Establishing goals of care  HPI/Patient Profile: Beth Chen  is a 84 y.o. female with a known history of colon cancer, chronic kidney disease, cholelithiasis, diverticulosis, large ventral hernia, hyperlipidemia, hypertension currently under surgical service for nausea vomiting.  Patient has NG tube with suction currently.   Clinical Assessment and Goals of Care: Patient is resting in bed with sitter at bedside. She is able to tell me her name, that she is in [place], and that she is in the hospital. Some statements during conversation demonstrated confusion as she made statements that did not make sense, and others that were not understandable.   [name] states she is widowed and has 4 children. She states her daughter helps her make decisions. She states she enjoys reading, watching t.v., and her crocheting class.  Spoke with daughter. Her dementia is more profound. She is at a SNF. She cannot walk. She has recurring UTI's and almost had septic shock previously du to UTI. She is concerned  for her mother's quality of life. She is concerned that at this point we are just at a place of waiting for the next big thing to happen. She states she forgets she just ate and wants more food.   Discussed aggressive care vs comfort care. She would like to see how things go with the NGT for now. She is leaning toward comfort instead of surgery if the SBO is not relieved with bowel rest. No NGT output noted for this shift.      SUMMARY OF RECOMMENDATIONS   Continue NGT for now.   Code Status/Advance Care Planning:  DNR    Symptom Management:   She does not complain of symptoms.    Palliative Prophylaxis:   Eye Care and Oral Care   Prognosis:   Unable to determine  Discharge Planning: To Be Determined      Primary Diagnoses: Present on Admission: . Small bowel obstruction due to adhesions [facility])   I have reviewed the medical record, interviewed the patient and family, and examined the patient. The following aspects are pertinent.  Past Medical History:  Diagnosis Date  . Allergic rhinitis 11/18/2013  . Asthma with COPD (chronic obstructive pulmonary disease) ([place]) 11/18/2013   Last Assessment & Plan:  No flair ups noted recently and breathing is essentially at baseline.    . Asthma without status asthmaticus    unspecified  . Borderline hypothyroidism 11/18/2013   Last Assessment & Plan:  Energy and tsh are doing well  . Cancer [facility])    colon cancer- surgically removed in 2006  . Cholelithiases   . Chronic kidney disease   . Colon cancer ([place])   . Complete left bundle branch block (LBBB) 11/18/2013  . Constipation 11/18/2013  . Diverticulosis   . Hiatal hernia    right  .  HLD (hyperlipidemia) 11/18/2013   Last Assessment & Plan:  Appropriate diet is followed and no myalgia's are noted.    . Hyperlipidemia, unspecified   . Hypertensive kidney disease with CKD stage III ([place]) 11/18/2013   Last Assessment & Plan:  Is compliant with hypertensive medications without clear side effects or lack of control.  Nausea and itching are not symptomatic and nsaids are being avoided.    . Melanoma ([place])   . Osteopenia    Social History   Socioeconomic History  . Marital status: Widowed    Spouse name: Not on file  . Number of children: Not on file  . Years of education: 33  . Highest education level: Bachelor's degree (e.g., BA, AB, BS)  Occupational History  . 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 Financial resource strain: Not on file  . Food insecurity:    Worry: Not on file    Inability: Not on file  . Transportation needs:    Medical: Not on file     Non-medical: Not on file  Tobacco Use  . Smoking status: Never Smoker  . Smokeless tobacco: Never Used  Substance and Sexual Activity  . Alcohol use: No  . Drug use: Never  . Sexual activity: Not on file  Lifestyle  . Physical activity:    Days per week: Not on file    Minutes per session: Not on file  . Stress: Not on file  Relationships  . Social connections:    Talks on phone: Not on file    Gets together: Not on file    Attends religious service: Not on file    Active member of club or organization: Not on file    Attends meetings of clubs or organizations: Not on file    Relationship status: Not on file  Other Topics Concern  . Not on file  Social History Narrative   From assisted living facility, however after recent discharge from the hospital-discharge to short-term rehab   -Most transfers all through wheelchair      Non smoker   No smokeless tobacco use   No alcohol use   Widowed   DNR with HCPOA and Living Will    Family History  Problem Relation Age of Onset  . Breast cancer Daughter 32       passed at 57 from breast cancer  . Heart disease Mother   . Heart failure Mother   . Lung cancer Father    Scheduled Meds: . heparin  5,000 Units Subcutaneous Q8H   Continuous Infusions: . sodium chloride 10 mL/hr at 12/09/17 1409  . dextrose 5% lactated ringers with KCl 20 mEq/L 100 mL/hr at 12/09/17 1409  . famotidine (PEPCID) IV Stopped (12/09/17 1005)  . piperacillin-tazobactam (ZOSYN)  IV Stopped (12/09/17 1345)   PRN Meds:.sodium chloride Medications Prior to Admission:  Prior to Admission medications   Medication Sig Start Date End Date Taking? Authorizing Provider  acetaminophen (TYLENOL) 325 MG tablet Take 650 mg by mouth every 4 (four) hours as needed. for pain/ increased temp. May be administered orally, per G-tube if needed or rectally if unable to swallow (separate order). Maximum dose for 24 hours is 3,000 mg from all sources of Acetaminophen/  Tylenol   Yes [provider]  Amino Acids-Protein Hydrolys (FEEDING SUPPLEMENT, PRO-STAT SUGAR FREE 64,) LIQD Take 30 mLs by mouth 2 (two) times daily between meals. low protein, low albumin   Yes [provider]  bisacodyl (DULCOLAX) 10 MG suppository  Place 10 mg rectally 2 (two) times daily as needed.   Yes [provider]  calcium carbonate (TUMS EX) 750 MG chewable tablet Chew 2 tablets by mouth daily.    Yes [provider]  Camphor-Menthol-Methyl Sal 06-09-28 % CREA Apply 1 application topically 3 (three) times daily as needed. Apply thin film of topical to pain site. Okay to leave on bed side.   Yes [provider]  diclofenac sodium (VOLTAREN) 1 % GEL Apply 4 g topically 4 (four) times daily. Apply 4 grams to the right knee QID for pain,   Yes [provider]  docusate sodium (COLACE) 100 MG capsule Take 1 capsule (100 mg total) by mouth daily as needed. 04/14/17 04/14/18 Yes McShane, Gerda Diss, MD  fluticasone (FLONASE) 50 MCG/ACT nasal spray Place 2 sprays into both nostrils daily.  11/19/15  Yes [provider]  fluticasone furoate-vilanterol (BREO ELLIPTA) 100-25 MCG/INH AEPB Inhale 1 puff into the lungs daily.    Yes [provider]  ipratropium-albuterol (DUONEB) 0.5-2.5 (3) MG/3ML SOLN Take 3 mLs by nebulization 3 (three) times daily.    Yes [provider]  lactulose (CHRONULAC) 10 GM/15ML solution Take 30 g by mouth 2 (two) times daily as needed.   Yes [provider]  losartan (COZAAR) 25 MG tablet Take 25 mg by mouth daily as needed (BP >150/90).    Yes [provider]  Nutritional Supplements (ENSURE ENLIVE PO) Take 1 Bottle by mouth daily.    Yes [provider]  nystatin-triamcinolone (MYCOLOG II) cream Apply 1 application topically 2 (two) times daily. apply nystatin cream under the right breast to treat yeast infection/irritation   Yes [provider]  pantoprazole  (PROTONIX) 40 MG tablet Take 40 mg by mouth daily. before a meal to help with esophagus problems    Yes [provider]  polyethylene glycol (MIRALAX / GLYCOLAX) packet Take 17 g by mouth daily.   Yes [provider]  Probiotic Product (RISA-BID PROBIOTIC) TABS Take 1 tablet by mouth 2 (two) times daily.   Yes [provider]  sodium phosphate (FLEET) 7-19 GM/118ML ENEM Place 1 enema rectally every three (3) days as needed for mild constipation or moderate constipation. Once every 3 days as needed    Yes [provider]  trimethoprim (TRIMPEX) 100 MG tablet Take 100 mg by mouth daily.    Yes [provider]  NON FORMULARY Diet Type - Regular    [provider]   No Known Allergies Review of Systems  All other systems reviewed and are negative.   Physical Exam  Constitutional: No distress.  Pulmonary/Chest: Effort normal.  Abdominal:  NGT  Neurological: She is alert.  Skin: Skin is warm and dry.    Vital Signs: BP (!) 157/77 (BP Location: Right Arm)   Pulse 64   Temp 98.2 F (36.8 C) (Oral)   Resp (!) 22   Ht 5' (1.524 m)   Wt 68.9 kg   SpO2 100%   BMI 29.67 kg/m  Pain Scale: 0-10   Pain Score: 0-No pain   SpO2: SpO2: 100 % O2 Device:SpO2: 100 % O2 Flow Rate: .   IO: Intake/output summary:   Intake/Output Summary (Last 24 hours) at 12/09/2017 1528 Last data filed at 12/09/2017 1409 Gross per 24 hour  Intake 2348.8 ml  Output 0 ml  Net 2348.8 ml    LBM: Last BM Date: 12/07/17 Baseline Weight: Weight: 68.9 kg Most recent weight: Weight: 68.9 kg  Palliative Assessment/Data:     Time In: 3:55 Time Out: 4:05 Time Total: 70 min Greater than 50%  of this time was spent counseling and coordinating care related to the above assessment and plan.  Signed by: Asencion Gowda, NP   Please contact Palliative Medicine Team phone at [phone] for questions and concerns.  For individual provider: See  Shea Evans

## 2017-12-09 NOTE — Progress Notes (Signed)
Please note patient is currently followed by outpatient PALLIATIVE at the [facility] [place]. CSW Shela Leff made aware.  Flo Shanks RN, BSN, [place] [facility] [place], [facility] [phone]

## 2017-12-09 NOTE — Progress Notes (Signed)
Pharmacy Antibiotic Note  Beth Chen is a 84 y.o. female admitted on 12/06/2017 with IAI, empiric treatment.  Pharmacy has been consulted for Zosyn dosing.  Plan: Zosyn 3.375g IV q8h (4 hour infusion).  Height: 5' (152.4 cm) Weight: 151 lb 14.4 oz (68.9 kg) IBW/kg (Calculated) : 45.5  Temp (24hrs), Avg:98 F (36.7 C), Min:97.8 F (36.6 C), Max:98.2 F (36.8 C)  Recent Labs  Lab 12/06/17 0605 12/06/17 0703 12/06/17 1255 12/07/17 0427 12/08/17 0449 12/09/17 0600  WBC 32.7*  --   --  15.5* 10.8*  --   CREATININE 0.84  --   --  1.02* 0.97 0.99  LATICACIDVEN  --  1.5 1.4  --   --   --     Estimated Creatinine Clearance: 30.1 mL/min (by C-G formula based on SCr of 0.99 mg/dL).    No Known Allergies  Antimicrobials this admission: Zosyn 10/7 >>  Dose adjustments this admission:   Microbiology results: 10/7 BCx x2: NGTD x 3 days 10/7 UCx: >= 100000 Aerococcus urinae  Thank you for allowing pharmacy to be a part of this patient's care.  Forrest Moron, PharmD 12/09/2017 2:52 PM

## 2017-12-09 NOTE — Progress Notes (Signed)
[place] [facility] [place] NAME: Beth Chen    MR#:  [id number]  DATE OF BIRTH:  [birth year]  SUBJECTIVE:  CHIEF COMPLAINT:  Has NG tube connected to suction Daughter at bedside No bowel movement yet Has mittens to upper extremities to prevent pulling out NG tube  REVIEW OF SYSTEMS:  CONSTITUTIONAL: No fever, fatigue or weakness.  EYES: No blurred or double vision.  EARS, NOSE, AND THROAT: No tinnitus or ear pain.  RESPIRATORY: No cough, shortness of breath, wheezing or hemoptysis.  CARDIOVASCULAR: No chest pain, orthopnea, edema.  GASTROINTESTINAL: Reports nausea, denies vomiting, diarrhea or abdominal pain.  GENITOURINARY: No dysuria, hematuria.  ENDOCRINE: No polyuria, nocturia,  HEMATOLOGY: No anemia, easy bruising or bleeding SKIN: No rash or lesion. MUSCULOSKELETAL: No joint pain or arthritis.   NEUROLOGIC: No tingling, numbness, weakness.  PSYCHIATRY: No anxiety or depression.   DRUG ALLERGIES:  No Known Allergies  VITALS:  Blood pressure (!) 149/73, pulse 63, temperature 97.8 F (36.6 C), temperature source Oral, resp. rate 18, height 5' (1.524 m), weight 68.9 kg, SpO2 96 %.  PHYSICAL EXAMINATION:  GENERAL:  84 y.o.-year-old patient lying in the bed with no acute distress.  EYES: Pupils equal, round, reactive to light and accommodation. No scleral icterus. Extraocular muscles intact.  HEENT: Head atraumatic, normocephalic.  NG tube intact NECK:  Supple, no jugular venous distention. No thyroid enlargement, no tenderness.  LUNGS: Normal breath sounds bilaterally, no wheezing, rales,rhonchi or crepitation. No use of accessory muscles of respiration.  CARDIOVASCULAR: S1, S2 normal. No murmurs, rubs, or gallops.  ABDOMEN: Soft, minimal diffuse discomfort and distentded no bowel sounds.  EXTREMITIES: No pedal edema, cyanosis, or clubbing.  NEUROLOGIC: Arousable answers few questions gait not checked.  PSYCHIATRIC: The patient  is alert and oriented x 2  SKIN: No obvious rash, lesion, or ulcer.    LABORATORY PANEL:   CBC Recent Labs  Lab 12/08/17 0449  WBC 10.8*  HGB 9.3*  HCT 29.9*  PLT 429*   ------------------------------------------------------------------------------------------------------------------  Chemistries  Recent Labs  Lab 12/06/17 0605  12/09/17 0600  NA 133*   < > 136  K 5.2*   < > 3.2*  CL 95*   < > 101  CO2 29   < > 27  GLUCOSE 165*   < > 122*  BUN 21   < > 8  CREATININE 0.84   < > 0.99  CALCIUM 8.9   < > 8.1*  AST 30  --   --   ALT 8  --   --   ALKPHOS 65  --   --   BILITOT 1.2  --   --    < > = values in this interval not displayed.   ------------------------------------------------------------------------------------------------------------------  Cardiac Enzymes Recent Labs  Lab 12/06/17 0605  TROPONINI <0.03   ------------------------------------------------------------------------------------------------------------------  RADIOLOGY:  Dg Naso G Tube Plc W/fl W/rad  Result Date: 12/08/2017 CLINICAL DATA:  Nasogastric tube placement EXAM: NASO G TUBE PLACEMENT WITH FL AND WITH RAD FLUOROSCOPY TIME:  Fluoroscopy Time:  12 seconds Radiation Exposure Index (if provided by the fluoroscopic device): 1.1 mGy Number of Acquired Spot Images: 0 COMPARISON:  None. FINDINGS: A 18 French nasogastric tube was inserted through the right nare and advanced into the stomach. Position was confirmed with fluoroscopy. No immediate complications. IMPRESSION: 1. Successful placement of a nasogastric tube with the tip in the fundus of the stomach. Electronically Signed   By: Kathreen Devoid  On: 12/08/2017 13:51    EKG:   Orders placed or performed during the hospital encounter of 12/06/17  . EKG 12-Lead  . EKG 12-Lead  . ED EKG  . ED EKG    ASSESSMENT AND PLAN:   84 year old elderly female patient with CKD stage III, hypertension, COPD,, hyperlipidemia, large ventral hernia  currently under surgical service for nausea and vomiting and abdominal distention  -Large ventral hernia with bowel obstruction not resolving Surgery follow-up appreciated NG tube with suction with sitter so that patient will not pull out the tube Surgery follow-up appreciated IV fluids and monitor electrolytes N.p.o. for now Empiric IV Zosyn antibiotic  -Emphysema Stable, nebulizer treatments as needed  -Hyperlipidemia-currently patient is n.p.o. in view of bowel obstruction  -Hypertensive chronic kidney disease stage III Monitor renal function closely and avoid nephrotoxins and NSAIDs  -GI prophylaxis with IV Pepcid  -DVT prophylaxis with subcu heparin  -CKD stage III Monitor renal function      All the records are reviewed and case discussed with Care Management/Social Workerr. Management plans discussed with the patient, family and they are in agreement.  CODE STATUS: fc  TOTAL TIME TAKING CARE OF THIS PATIENT: 32 minutes.   .  Note: This dictation was prepared with Dragon dictation along with smaller phrase technology. Any transcriptional errors that result from this process are unintentional.   [name] M.D on 12/09/2017 at 12:17 PM  Between 7am to 6pm - Pager - [phone] After 6pm go to [email or web address] - password [email or web address] [facility]  [facility]  Office  [phone]  CC: Primary care physician; Kirk Ruths, MD

## 2017-12-10 ENCOUNTER — Inpatient Hospital Stay: Payer: Medicare Other

## 2017-12-10 DIAGNOSIS — K56609 Unspecified intestinal obstruction, unspecified as to partial versus complete obstruction: Secondary | ICD-10-CM

## 2017-12-10 DIAGNOSIS — Z515 Encounter for palliative care: Secondary | ICD-10-CM

## 2017-12-10 DIAGNOSIS — Z7189 Other specified counseling: Secondary | ICD-10-CM

## 2017-12-10 LAB — BASIC METABOLIC PANEL
Anion gap: 8 (ref 5–15)
BUN: 5 mg/dL — ABNORMAL LOW (ref 8–23)
CHLORIDE: 100 mmol/L (ref 98–111)
CO2: 28 mmol/L (ref 22–32)
CREATININE: 0.96 mg/dL (ref 0.44–1.00)
Calcium: 8.6 mg/dL — ABNORMAL LOW (ref 8.9–10.3)
GFR calc non Af Amer: 49 mL/min — ABNORMAL LOW (ref >60)
GFR, EST AFRICAN AMERICAN: 57 mL/min — AB (ref >60)
Glucose, Bld: 121 mg/dL — ABNORMAL HIGH (ref 70–99)
POTASSIUM: 3.2 mmol/L — AB (ref 3.5–5.1)
Sodium: 136 mmol/L (ref 135–145)

## 2017-12-10 MED ORDER — POTASSIUM CHLORIDE 2 MEQ/ML IV SOLN
INTRAVENOUS | Status: DC
  Administered 2017-12-10 – 2017-12-11 (×4): via INTRAVENOUS
  Filled 2017-12-10 (×6): qty 1000

## 2017-12-10 MED ORDER — POTASSIUM CHLORIDE 2 MEQ/ML IV SOLN: INTRAVENOUS | Status: DC

## 2017-12-10 MED ORDER — SORBITOL 70 % SOLN
960.0000 mL | TOPICAL_OIL | Freq: Every day | ORAL | Status: DC
  Administered 2017-12-10 – 2017-12-12 (×3): 960 mL via RECTAL
  Filled 2017-12-10 (×5): qty 473

## 2017-12-10 NOTE — Progress Notes (Signed)
Verbal order from Dr. Rosana Hoes to place order for pt to receive a SMOG enema daily. RN will continue to monitor pt.    Arcadio Cope [employer]

## 2017-12-10 NOTE — Progress Notes (Addendum)
SURGICAL PROGRESS NOTE(cpt: [phone])  Patient seen and examined as described below with surgical PA-C, Ardell Isaacs.  Assessment/Plan: (ICD-10's: K56.52) 84 y.o.femalewith radiographicly improved complete small bowel obstruction, likely attributable to post-surgical adhesions followingremote Right colectomyin [place] for colon cancer, not likely attributable to chronic wide-defect easily reducible (and easily reduced) RLQ incisional hernia with dilated bowel both entering and exiting hernia on CT + non-dilated bowel within CT, complicated byresolvedleukocytosis, chronic constipation, and comorbidities includingadvanced chronological age, HTN, HLD, CKD (stage 3), chronic cardiac arrhythmia (Left bundle branch block), COPD, hypothyroidism, hiatal hernia with GERD, and osteopenia.   - palliative care consult appreciated - NPO for now, IV fluids, correct electrolytes - monitor ongoing bowel function and abdominal exam  -NG tube for nasogastric decompression, though recovery delayed by patient removal of NG tube yesterday  - since exam unreliable due to dementia, will plan to keep NG tube 1 - 2 more days before removal anticipated - patient and family express their decision not to consider surgical management under any circumstances - antibiotics (?UTI) andmedical management comorbidities as per medical team - frequent repositioning to reduce risk for pressure wounds -out of bed to chair, unable to ambulate at baseline - DVT prophylaxis  I have personally reviewed the patient's chart, evaluated/examined the patient, proposed the recommended management, and discussed these recommendations with the patient and her daughter to their expressed satisfaction as well as with patient's RN and medical physician.  -- Marilynne Drivers Rosana Hoes, MD, [place]: [facility] - Partnering for exceptional care. Office: [phone]      SURGICAL PROGRESS NOTE (cpt [phone])  Hospital Day(s): 4.   Post op day(s):  Marland Kitchen   Interval History: Patient seen and examined, no acute events or new complaints overnight. Patient sleeping comfortably  Daughter at bedside unsure if passing flatus. Patient sleeping.   Review of Systems:  Constitutional: denies fever, chills  HEENT: denies cough or congestion  Respiratory: denies any shortness of breath  Cardiovascular: denies chest pain or palpitations  Gastrointestinal: denies abdominal pain, N/V, or diarrhea/and bowel function as per interval history Genitourinary: denies burning with urination or urinary frequency Musculoskeletal: denies pain, decreased motor or sensation Integumentary: denies any other rashes or skin discolorations Neurological: denies HA or vision/hearing changes   Vital signs in last 24 hours: [min-max] current  Temp:  [97.4 F (36.3 C)-98.2 F (36.8 C)] 97.4 F (36.3 C) (10/11 0604) Pulse Rate:  [62-64] 62 (10/11 0604) Resp:  [18-22] 18 (10/11 0604) BP: (156-160)/(57-77) 160/71 (10/11 0604) SpO2:  [96 %-100 %] 99 % (10/11 0604)     Height: 5' (152.4 cm) Weight: 68.9 kg BMI (Calculated): 29.67   Intake/Output this shift:  No intake/output data recorded.   Intake/Output last 2 shifts:  @IOLAST2SHIFTS @   Physical Exam:  Constitutional: alert, cooperative and no distress  HENT: NGT in place Eyes: PERRL, EOM's grossly intact and symmetric  Respiratory: breathing non-labored at rest  Cardiovascular: regular rate and sinus rhythm  Gastrointestinal: soft, non-tender, and non-distended, large right lateral incisional hernia, easily reducible Musculoskeletal: Mittens in place    Labs:  CBC Latest Ref Rng & Units 12/08/2017 12/07/2017 12/06/2017  WBC 4.0 - 10.5 K/uL 10.8(H) 15.5(H) 32.7(H)  Hemoglobin 12.0 - 15.0 g/dL 9.3(L) 9.7(L) 11.9(L)  Hematocrit 36.0 - 46.0 %  29.9(L) 29.3(L) 37.2  Platelets 150 - 400 K/uL 429(H) 465(H) 527(H)   CMP Latest Ref Rng & Units 12/10/2017 12/09/2017 12/08/2017  Glucose 70 - 99 mg/dL 121(H) 122(H) 90  BUN  8 - 23 mg/dL 5(L) 8 12  Creatinine 0.44 - 1.00 mg/dL 0.96 0.99 0.97  Sodium 135 - 145 mmol/L 136 136 135  Potassium 3.5 - 5.1 mmol/L 3.2(L) 3.2(L) 3.7  Chloride 98 - 111 mmol/L 100 101 101  CO2 22 - 32 mmol/L 28 27 27   Calcium 8.9 - 10.3 mg/dL 8.6(L) 8.1(L) 8.0(L)  Total Protein 6.5 - 8.1 g/dL - - -  Total Bilirubin 0.3 - 1.2 mg/dL - - -  Alkaline Phos 38 - 126 U/L - - -  AST 15 - 41 U/L - - -  ALT 0 - 44 U/L - - -     Imaging studies: No new pertinent imaging studies   Assessment/Plan: (ICD-10's: K56.52) 84 y.o.femalewith complete small bowel obstruction for which NGT needed to be replaced yesterday after being removed by the patient, likely attributable to post-surgical adhesions followingremote Right colectomyin [place] for colon cancer rather than attributable to chronic wide-defect easily reducible (and easily reduced) RLQ incisional hernia with dilated bowel entering and exiting hernia on CT with non-dilated bowel within CT, complicated bymuch-improvedleukocytosis, chronic constipation/fecal impaction, and new hypokalemia and bypertinent comorbidities includingadvanced chronological age, HTN, HLD, CKD (stage 3), cardiac arrhythmia (Left bundle branch block), COPD, hypothyroidism, hiatal hernia with GERD, and osteopenia   - Continue NGT, NPO, IVF, safety sitter/mittens to ensure NGT remains in place             - Monitor ongoing bowel function and abdominal exam  - Surgical intervention if doesn't improve was also discussed, palliative following, no defiinitive decision Discussed at bedside with patient's daughter, palliative care, and Dr. Rosana Hoes, likely will not proceed with surgery. Will give NGT extra time to decompress.  - Antibiotics (?UTI) andmedical management comorbidities  as per medical team             - Replete potassium today and trend BMP - Frequent repositioning to reduce risk for pressure wounds -Out of bed to chair, unable to ambulate at baseline, PT following - Repeat enemas for constipation - DVT prophylaxis  All of the above findings and recommendations were discussed with the patient, patient's family, and the medical team, and all of patient's and family's questions were answered to their expressed satisfaction.  Thank you for the opportunity to participate in this patient's care.  -- Edison Simon, PA-C [facility] 12/10/2017, 8:12 AM [phone] M-F: 7am - 4pm

## 2017-12-10 NOTE — Progress Notes (Addendum)
[facility] [place] [facility] [place] NAME: Beth Chen    MR#:  [id number]  DATE OF BIRTH:  1922/02/23  SUBJECTIVE:  No events overnight per sitter, patient resting comfortably in bed  REVIEW OF SYSTEMS:  Unable to obtain as patient is lethargic and sleepy  DRUG ALLERGIES:  No Known Allergies  VITALS:  Blood pressure (!) 160/71, pulse 62, temperature (!) 97.4 F (36.3 C), temperature source Oral, resp. rate 18, height 5' (1.524 m), weight 68.9 kg, SpO2 99 %.  PHYSICAL EXAMINATION:  GENERAL:  84 y.o.-year-old patient lying in the bed with no acute distress.  EYES: Pupils equal, round, reactive to light and accommodation. No scleral icterus. Extraocular muscles intact.  HEENT: Head atraumatic, normocephalic.  NG tube intact NECK:  Supple, no jugular venous distention. No thyroid enlargement, no tenderness.  LUNGS: Normal breath sounds bilaterally, no wheezing, rales,rhonchi or crepitation. No use of accessory muscles of respiration.  CARDIOVASCULAR: S1, S2 normal. No murmurs, rubs, or gallops.  ABDOMEN: Soft, minimal diffuse discomfort and distentded decreased bowel sounds.  EXTREMITIES: No pedal edema, cyanosis, or clubbing.  NEUROLOGIC: Arousable answers few questions gait not checked.  PSYCHIATRIC: The patient is alert and oriented x 2  SKIN: No obvious rash, lesion, or ulcer.    LABORATORY PANEL:   CBC Recent Labs  Lab 12/08/17 0449  WBC 10.8*  HGB 9.3*  HCT 29.9*  PLT 429*   ------------------------------------------------------------------------------------------------------------------  Chemistries  Recent Labs  Lab 12/06/17 0605  12/10/17 0515  NA 133*   < > 136  K 5.2*   < > 3.2*  CL 95*   < > 100  CO2 29   < > 28  GLUCOSE 165*   < > 121*  BUN 21   < > 5*  CREATININE 0.84   < > 0.96  CALCIUM 8.9   < > 8.6*  AST 30  --   --   ALT 8  --   --   ALKPHOS 65  --   --   BILITOT 1.2  --   --    < > = values in this  interval not displayed.   ------------------------------------------------------------------------------------------------------------------  Cardiac Enzymes Recent Labs  Lab 12/06/17 0605  TROPONINI <0.03   ------------------------------------------------------------------------------------------------------------------  RADIOLOGY:  Dg Abd 2 Views  Result Date: 12/10/2017 CLINICAL DATA:  Small-bowel obstruction, nasogastric tube in place, history colon cancer, diverticulosis, chronic kidney disease EXAM: ABDOMEN - 2 VIEW COMPARISON:  12/07/2017 FINDINGS: Nasogastric tube coiled in mid stomach. Increased stool in colon and rectum. No small bowel dilatation or bowel wall thickening identified. Bones demineralized. No definite urinary tract calcification. IMPRESSION: Increased stool in colon. No small bowel distention seen. Electronically Signed   By: Lavonia Dana M.D.   On: 12/10/2017 10:37   Dg Addison Bailey G Tube Plc W/fl W/rad  Result Date: 12/08/2017 CLINICAL DATA:  Nasogastric tube placement EXAM: NASO G TUBE PLACEMENT WITH FL AND WITH RAD FLUOROSCOPY TIME:  Fluoroscopy Time:  12 seconds Radiation Exposure Index (if provided by the fluoroscopic device): 1.1 mGy Number of Acquired Spot Images: 0 COMPARISON:  None. FINDINGS: A 18 French nasogastric tube was inserted through the right nare and advanced into the stomach. Position was confirmed with fluoroscopy. No immediate complications. IMPRESSION: 1. Successful placement of a nasogastric tube with the tip in the fundus of the stomach. Electronically Signed   By: Kathreen Devoid   On: 12/08/2017 13:51    EKG:   Orders placed or  performed during the hospital encounter of 12/06/17  . EKG 12-Lead  . EKG 12-Lead  . ED EKG  . ED EKG    ASSESSMENT AND PLAN:  84 year old elderly female patient with CKD stage III, hypertension, COPD,, hyperlipidemia, large ventral hernia currently under surgical service for nausea and vomiting and abdominal  distention  *Acute complete bowel obstruction  Plan of care per primary service is conservative therapy with NG tube for decompression, IV fluids for rehydration, adult pain protocol   *Acute Aerococcus urinae UTI Resolving  On Zosyn, follow-up on cultures   *Acute hypokalemia Repleted  *COPD without exacerbation Stable Continue BTs as needed  *Hyperlipidemia currently patient is n.p.o. in view of bowel obstruction  *Hypertensive chronic kidney disease stage III Stable Continue to avoid nephrotoxins and NSAIDs  Long-term prognosis is poor, palliative care following Family do not want any aggressive measures such as laparotomy and surgery, okay with NG tube with suction for decompression of bowel  All the records are reviewed and case discussed with Care Management/Social Workerr. Management plans discussed with the patient, family and they are in agreement.  CODE STATUS: dnr  TOTAL TIME TAKING CARE OF THIS PATIENT: 35 minutes.   .  Note: This dictation was prepared with Dragon dictation along with smaller phrase technology. Any transcriptional errors that result from this process are unintentional.   [name] M.D on 12/10/2017 at 12:09 PM  Between 7am to 6pm - Pager - [phone] After 6pm go to [email or web address] - password [email or web address] [facility]  [facility]  Office  [phone]  CC: Primary care physician; Kirk Ruths, MD

## 2017-12-10 NOTE — Progress Notes (Addendum)
Daily Progress Note   Patient Name: Beth Chen       Date: 12/10/2017 DOB: 1922-02-29  Age: 84 y.o. MRN#: [id number] Attending Physician: Vickie Epley, MD Primary Care Physician: Kirk Ruths, MD Admit Date: 12/06/2017  Reason for Consultation/Follow-up: Establishing goals of care  Subjective:  Patient is resting in bed with NGT in place. Daughter is at bedside. Surgery in speaking with patient. Improvement in SBO. NGT to remain. Spoke with daughter. She states her mother's legs are weak and she uses a wheelchair. The familly was able to transfer her a few months ago as she was able to take a few steps, but now are unable to due to weakness. She tells me PT at the facility has stopped working with her as they feel she is a fall risk and is not improving. Patient is able to feed herself.  She enjoys chocolate and wheelchair rides around the pond at the facility. She has had several recurrent UTI's earlier in the year and a gastric ulcer in April.    We discussed goals of care. Daughter tells me that when her father was older, the patient stated that they needed to stop giving him Ensure as it was prolonging things. Her mother would not want to suffer. She does not want surgical management of her mother. If the NGT does not relieve the SBO, she would want to focus on comfort. She confirms DNR/DNI status. She would like to treat things that are treatable but states she does not want anything heroic. She is a current outpatient palliative patient. Transition to hospice care if SBO is not relieved. Per local hospice home, she would be able to go to the facility with her NGT for comfort to prevent N/V and allow for sips of fluid.     Length of Stay: 4  Current Medications: Scheduled Meds:  .  heparin  5,000 Units Subcutaneous Q8H    Continuous Infusions: . sodium chloride 250 mL (12/10/17 0957)  . dextrose 5% lactated ringers with KCl 20 mEq/L 100 mL/hr at 12/10/17 0558  . famotidine (PEPCID) IV 20 mg (12/10/17 0958)  . piperacillin-tazobactam (ZOSYN)  IV 3.375 g (12/10/17 0958)    PRN Meds: sodium chloride  Physical Exam  Constitutional: No distress.  Pulmonary/Chest: Effort normal.  Neurological:  Resting with eyes closed.  Vital Signs: BP (!) 160/71 (BP Location: Left Arm)   Pulse 62   Temp (!) 97.4 F (36.3 C) (Oral)   Resp 18   Ht 5' (1.524 m)   Wt 68.9 kg   SpO2 99%   BMI 29.67 kg/m  SpO2: SpO2: 99 % O2 Device: O2 Device: Room Air O2 Flow Rate:    Intake/output summary:   Intake/Output Summary (Last 24 hours) at 12/10/2017 1241 Last data filed at 12/10/2017 0558 Gross per 24 hour  Intake 2221.55 ml  Output 0 ml  Net 2221.55 ml   LBM: Last BM Date: 12/07/17 Baseline Weight: Weight: 68.9 kg Most recent weight: Weight: 68.9 kg       Palliative Assessment/Data:  NPO    Flowsheet Rows     Most Recent Value  Intake Tab  Referral Department  Hospitalist  Unit at Time of Referral  Med/Surg Unit  Palliative Care Primary Diagnosis  Cancer  Date Notified  12/09/17  Palliative Care Type  New Palliative care  Reason for referral  Clarify Goals of Care  Date of Admission  12/06/17  Date first seen by Palliative Care  12/09/17  # of days Palliative referral response time  0 Day(s)  # of days IP prior to Palliative referral  3  Clinical Assessment  Psychosocial & Spiritual Assessment  Palliative Care Outcomes      Patient Active Problem List   Diagnosis Date Noted  . Intestinal adhesions with complete obstruction ([place]) 12/06/2017  . Essential hypertension, benign 11/20/2017  . Frequent UTI 11/20/2017  . Primary osteoarthritis of right knee 11/20/2017  . Protein-calorie malnutrition ([place]) 08/30/2017  . Reflux esophagitis   .  Anemia 05/30/2017  . Generalized weakness 05/20/2017  . Lower GI bleed 09/07/2016  . GI bleed 09/05/2016  . Allergic rhinitis 11/18/2013  . Asthma with COPD (chronic obstructive pulmonary disease) ([place]) 11/18/2013  . Borderline hypothyroidism 11/18/2013  . Complete left bundle branch block (LBBB) 11/18/2013  . Constipation 11/18/2013  . Hypertensive kidney disease with CKD stage III ([place]) 11/18/2013    Palliative Care Assessment & Plan    Recommendations/Plan:  Continue current care per surgery.  Daughter wants non-op management.    If SBO is not relieved with bowel rest, please place SW consult for hospice placement.  She is a candidate for hospice home with her NGT for comfort to prevent N/V and to allow sips of fluids.   If SBO is relieved, she is currently a palliative patient outpatient.  Code Status:    Code Status Orders  (From admission, onward)         Start     Ordered   12/06/17 1426  Do not attempt resuscitation (DNR)  Continuous    Question Answer Comment  In the event of cardiac or respiratory ARREST Do not call a "code blue"   In the event of cardiac or respiratory ARREST Do not perform Intubation, CPR, defibrillation or ACLS   In the event of cardiac or respiratory ARREST Use medication by any route, position, wound care, and other measures to relive pain and suffering. May use oxygen, suction and manual treatment of airway obstruction as needed for comfort.      12/06/17 1425        Code Status History    Date Active Date Inactive Code Status Order ID Comments User Context   12/06/2017 1110 12/06/2017 1424 Full Code [id number]  Vickie Epley, MD ED   05/30/2017 1629 06/02/2017 1825 DNR [id number]  Gladstone Lighter, MD ED   09/05/2016 2050 09/08/2016 1631 DNR [id number]  Vaughan Basta, MD Inpatient    Advance Directive Documentation     Most Recent Value  Type of Advance Directive  Healthcare Power of [place], Out of facility DNR (pink MOST or  yellow form)  Pre-existing out of facility DNR order (yellow form or pink MOST form)  -  "MOST" Form in Place?  -       Prognosis:   Unable to determine  Discharge Planning:  To Be Determined  Care plan was discussed with Dr. Rosana Hoes  Thank you for allowing the Palliative Medicine Team to assist in the care of this patient.   Time In: 9:15 Time Out: 10:25 Total Time 70 min Prolonged Time Billed  yes      Greater than 50%  of this time was spent counseling and coordinating care related to the above assessment and plan.  Asencion Gowda, NP  Please contact Palliative Medicine Team phone at [phone] for questions and concerns.

## 2017-12-10 NOTE — Progress Notes (Signed)
Pt could only tolerate half of the SMOG enema at this time.

## 2017-12-11 LAB — BASIC METABOLIC PANEL
ANION GAP: 9 (ref 5–15)
BUN: 6 mg/dL — ABNORMAL LOW (ref 8–23)
CO2: 27 mmol/L (ref 22–32)
Calcium: 8.8 mg/dL — ABNORMAL LOW (ref 8.9–10.3)
Chloride: 101 mmol/L (ref 98–111)
Creatinine, Ser: 1.04 mg/dL — ABNORMAL HIGH (ref 0.44–1.00)
GFR calc Af Amer: 52 mL/min — ABNORMAL LOW (ref >60)
GFR, EST NON AFRICAN AMERICAN: 45 mL/min — AB (ref >60)
Glucose, Bld: 128 mg/dL — ABNORMAL HIGH (ref 70–99)
POTASSIUM: 4.2 mmol/L (ref 3.5–5.1)
SODIUM: 137 mmol/L (ref 135–145)

## 2017-12-11 LAB — CULTURE, BLOOD (ROUTINE X 2)
Culture: NO GROWTH
Culture: NO GROWTH
SPECIAL REQUESTS: ADEQUATE

## 2017-12-11 MED ORDER — FAMOTIDINE IN NACL 20-0.9 MG/50ML-% IV SOLN
20.0000 mg | INTRAVENOUS | Status: DC
  Administered 2017-12-12 – 2017-12-13 (×2): 20 mg via INTRAVENOUS
  Filled 2017-12-11 (×2): qty 50

## 2017-12-11 NOTE — Progress Notes (Signed)
SURGICAL PROGRESS NOTE (cpt [phone])  Hospital Day(s): 5.   Post op day(s):  Marland Kitchen   Interval History: Patient seen and examined, no acute events or new complaints overnight. Patient remains unsure whether passing flatus due to chronic dementia, though she continues to deny abdominal pain, N/V, fever/chills, CP, or SOB. She is reportedly not ambulatory at baseline. No witnessed flatus or BM's are reported. NG tube drainage seems to have decreased significantly.  Review of Systems:  Constitutional: denies fever, chills  HEENT: denies cough or congestion  Respiratory: denies any shortness of breath  Cardiovascular: denies chest pain or palpitations  Gastrointestinal: abdominal pain, N/V, and bowel function as per interval history Genitourinary: denies burning with urination or urinary frequency Musculoskeletal: denies pain, decreased motor or sensation Integumentary: denies any other rashes or skin discolorations Neurological: denies HA or vision/hearing changes   Vital signs in last 24 hours: [min-max] current  Temp:  [97.5 F (36.4 C)-98.5 F (36.9 C)] 97.9 F (36.6 C) (10/12 0435) Pulse Rate:  [56-69] 56 (10/12 0435) Resp:  [18-20] 20 (10/12 0435) BP: (131-171)/(70-81) 132/70 (10/12 0538) SpO2:  [97 %-99 %] 99 % (10/12 0435)     Height: 5' (152.4 cm) Weight: 68.9 kg BMI (Calculated): 29.67   Intake/Output this shift:  Total I/O In: 336.6 [I.V.:296.3; IV Piggyback:40.4] Out: -    Intake/Output last 2 shifts:  @IOLAST2SHIFTS @   Physical Exam:  Constitutional: alert, cooperative and no distress  HENT: normocephalic without obvious abnormality  Eyes: PERRL, EOM's grossly intact and symmetric  Neuro: CN II - XII grossly intact and symmetric without deficit  Respiratory: breathing non-labored at rest  Cardiovascular: regular rate and sinus rhythm  Gastrointestinal: soft, non-tender, and minimally distended Musculoskeletal: UE and LE FROM, no edema or wounds, motor and sensation  grossly intact, NT   Labs:  CBC Latest Ref Rng & Units 12/08/2017 12/07/2017 12/06/2017  WBC 4.0 - 10.5 K/uL 10.8(H) 15.5(H) 32.7(H)  Hemoglobin 12.0 - 15.0 g/dL 9.3(L) 9.7(L) 11.9(L)  Hematocrit 36.0 - 46.0 % 29.9(L) 29.3(L) 37.2  Platelets 150 - 400 K/uL 429(H) 465(H) 527(H)   CMP Latest Ref Rng & Units 12/11/2017 12/10/2017 12/09/2017  Glucose 70 - 99 mg/dL 128(H) 121(H) 122(H)  BUN 8 - 23 mg/dL 6(L) 5(L) 8  Creatinine 0.44 - 1.00 mg/dL 1.04(H) 0.96 0.99  Sodium 135 - 145 mmol/L 137 136 136  Potassium 3.5 - 5.1 mmol/L 4.2 3.2(L) 3.2(L)  Chloride 98 - 111 mmol/L 101 100 101  CO2 22 - 32 mmol/L 27 28 27   Calcium 8.9 - 10.3 mg/dL 8.8(L) 8.6(L) 8.1(L)  Total Protein 6.5 - 8.1 g/dL - - -  Total Bilirubin 0.3 - 1.2 mg/dL - - -  Alkaline Phos 38 - 126 U/L - - -  AST 15 - 41 U/L - - -  ALT 0 - 44 U/L - - -   Imaging studies: No new pertinent imaging studies  Assessment/Plan: (ICD-10's: K56.52) 84 y.o.femalewith radiographicly improved complete small bowel obstruction and decreased NG tube drainage, but unreliable exam, with SBO likely attributable to post-surgical adhesions followingremote Right colectomyin [place] for colon cancer, not likelyattributable to chronic wide-defect easily reducible (and easily reduced) RLQ incisional hernia with dilated bowel bothentering and exiting hernia on CT +non-dilated bowel within CT, complicated byresolvedleukocytosis,chronic constipation,and comorbidities includingadvanced chronological age, HTN, HLD, CKD (stage 3), chroniccardiac arrhythmia (Left bundle branch block), COPD, hypothyroidism, hiatal hernia with GERD, and osteopenia.              - palliative care  consult appreciated - NPO for now, IV fluids, correct electrolytes - monitor ongoing bowel function and abdominal exam  -since exam unreliable due to dementia, will plan to keep NG tube until tomorrow, check morning abdominal x-ray, and anticipate  clamp trial - patient and family express their decision not to consider surgical management under any circumstances - antibiotics (?UTI) andmedical management comorbidities as per medical team - frequent repositioning to reduce risk for pressure wounds -out of bed to chair, unable to ambulate at baseline - DVT prophylaxis  I have personally reviewed the patient's chart, evaluated/examined the patient, proposed the recommended management, and discussed these recommendations with the patient and herdaughterto their expressed satisfaction as well as with patient's RN and medical physician.  -- Marilynne Drivers Rosana Hoes, MD, [place]: [facility] - Partnering for exceptional care. Office: [phone]

## 2017-12-11 NOTE — Progress Notes (Signed)
PHARMACY NOTE:  RENAL DOSAGE ADJUSTMENT  Current famotidine regimen includes a mismatch between antimicrobial dosage and estimated renal function.  As per policy approved by the Pharmacy & Therapeutics and Medical Executive Committees, the dosage will be adjusted accordingly.  Current famotidine dosage:  20mg  IV q12h  Renal Function:  Estimated Creatinine Clearance: 28.7 mL/min (A) (by C-G formula based on SCr of 1.04 mg/dL (H)). []      On intermittent HD, scheduled: []      On CRRT    Famotidine dosage has been changed to:  20mg  IV q24h  Thank you for allowing pharmacy to be a part of this patient's care.  Dallie Piles, [facility] 12/11/2017 12:25 PM

## 2017-12-12 ENCOUNTER — Inpatient Hospital Stay: Payer: Medicare Other

## 2017-12-12 LAB — CBC
HEMATOCRIT: 34.9 % — AB (ref 36.0–46.0)
Hemoglobin: 10.9 g/dL — ABNORMAL LOW (ref 12.0–15.0)
MCH: 26.8 pg (ref 26.0–34.0)
MCHC: 31.2 g/dL (ref 30.0–36.0)
MCV: 86 fL (ref 80.0–100.0)
Platelets: 454 10*3/uL — ABNORMAL HIGH (ref 150–400)
RBC: 4.06 MIL/uL (ref 3.87–5.11)
RDW: 14.6 % (ref 11.5–15.5)
WBC: 10.2 10*3/uL (ref 4.0–10.5)
nRBC: 0 % (ref 0.0–0.2)

## 2017-12-12 MED ORDER — POLYETHYLENE GLYCOL 3350 17 G PO PACK
17.0000 g | PACK | Freq: Every day | ORAL | Status: DC | PRN
  Administered 2017-12-12 – 2017-12-13 (×2): 17 g via ORAL
  Filled 2017-12-12 (×2): qty 1

## 2017-12-12 MED ORDER — KCL IN DEXTROSE-NACL 20-5-0.45 MEQ/L-%-% IV SOLN
INTRAVENOUS | Status: DC
  Administered 2017-12-12 – 2017-12-13 (×2): via INTRAVENOUS
  Filled 2017-12-12 (×4): qty 1000

## 2017-12-12 NOTE — Progress Notes (Signed)
SURGICAL PROGRESS NOTE (cpt [phone])  Hospital Day(s): 6.   Post op day(s):  Marland Kitchen   Interval History: Patient seen and examined, no acute events or new complaints overnight. Patient remains uncertain whether passing flatus due to chronic dementia, though she continues to deny abdominal pain or N/V, fever/chills, CP, or SOB. NG tube removed from suction ~7:45 this am.  Review of Systems:  Constitutional: denies fever, chills  HEENT: denies cough or congestion  Respiratory: denies any shortness of breath  Cardiovascular: denies chest pain or palpitations  Gastrointestinal: abdominal pain, N/V, and bowel function as per interval history Genitourinary: denies burning with urination or urinary frequency Musculoskeletal: denies pain, decreased motor or sensation Integumentary: denies any other rashes or skin discolorations Neurological: denies HA or vision/hearing changes   Vital signs in last 24 hours: [min-max] current  Temp:  [97.4 F (36.3 C)-97.8 F (36.6 C)] 97.8 F (36.6 C) (10/13 0538) Pulse Rate:  [63-69] 63 (10/13 0538) Resp:  [16-24] 16 (10/13 0538) BP: (140-154)/(64-85) 140/85 (10/13 0538) SpO2:  [97 %-100 %] 100 % (10/13 0538)     Height: 5' (152.4 cm) Weight: 68.9 kg BMI (Calculated): 29.67   Intake/Output this shift:  No intake/output data recorded.   Intake/Output last 2 shifts:  @IOLAST2SHIFTS @   Physical Exam:  Constitutional: alert, cooperative and no distress  HENT: normocephalic without obvious abnormality  Eyes: PERRL, EOM's grossly intact and symmetric  Neuro: CN II - XII grossly intact and symmetric without deficit  Respiratory: breathing non-labored at rest  Cardiovascular: regular rate and sinus rhythm  Gastrointestinal: soft, non-tender, and non-distended Musculoskeletal: UE and LE FROM, no edema or wounds, motor and sensation grossly intact, NT   Labs:  CBC Latest Ref Rng & Units 12/12/2017 12/08/2017 12/07/2017  WBC 4.0 - 10.5 K/uL 10.2 10.8(H)  15.5(H)  Hemoglobin 12.0 - 15.0 g/dL 10.9(L) 9.3(L) 9.7(L)  Hematocrit 36.0 - 46.0 % 34.9(L) 29.9(L) 29.3(L)  Platelets 150 - 400 K/uL 454(H) 429(H) 465(H)   CMP Latest Ref Rng & Units 12/11/2017 12/10/2017 12/09/2017  Glucose 70 - 99 mg/dL 128(H) 121(H) 122(H)  BUN 8 - 23 mg/dL 6(L) 5(L) 8  Creatinine 0.44 - 1.00 mg/dL 1.04(H) 0.96 0.99  Sodium 135 - 145 mmol/L 137 136 136  Potassium 3.5 - 5.1 mmol/L 4.2 3.2(L) 3.2(L)  Chloride 98 - 111 mmol/L 101 100 101  CO2 22 - 32 mmol/L 27 28 27   Calcium 8.9 - 10.3 mg/dL 8.8(L) 8.6(L) 8.1(L)  Total Protein 6.5 - 8.1 g/dL - - -  Total Bilirubin 0.3 - 1.2 mg/dL - - -  Alkaline Phos 38 - 126 U/L - - -  AST 15 - 41 U/L - - -  ALT 0 - 44 U/L - - -   Imaging studies:  Abdominal X-ray (12/12/2017) - personally reviewed and discussed with patient's daughter and RN Nonobstructive bowel gas pattern. Enteric tube is adequately positioned in the stomach.  Assessment/Plan: (ICD-10's: K56.52) 84 y.o.femalewithradiographicly improvedcomplete small bowel obstruction and decreased NG tube drainage, but unreliable exam, with SBO likely attributable to post-surgical adhesions followingremote Right colectomyin [place] for colon cancer, not likelyattributable to chronic wide-defect easily reducible (and easily reduced) RLQ incisional hernia with dilated bowel bothentering and exiting hernia on CT +non-dilated bowel within CT, complicated byresolvedleukocytosis,chronic constipation,and comorbidities includingadvanced chronological age, HTN, HLD, CKD (stage 3), chroniccardiac arrhythmia (Left bundle branch block), COPD, hypothyroidism, hiatal hernia with GERD, and osteopenia.  - palliative care consult appreciated - monitor ongoing bowel function and abdominal exam  - check  NG tube residuals 4 hours after removed from suction  - if NG tube residual <200 mL when replaced to suction, remove NG tube and start clear liquids  diet  - okay to advance as tolerated to full liquids diet for dinner and to soft diet (as tolerated) tomorrow morning - patient and family express their decision not to consider surgical management under any circumstances - medical management of comorbidities as per medical team - frequent repositioning to reduce risk for pressure wounds -out of bed to chair, unable to ambulate at baseline - DVT prophylaxis  All of the above recommendations were discussed with the patient and herdaughter, as well as with patient's RN, and all of patient's and her daughter's questions were answered to their expressed satisfaction.  -- Marilynne Drivers Rosana Hoes, MD, [place]: [facility] - Partnering for exceptional care. Office: [phone]

## 2017-12-12 NOTE — Progress Notes (Signed)
[facility] [place] NAME: Beth Chen    MR#:  [id number]  DATE OF BIRTH:  [birth year]  SUBJECTIVE:  No events overnight per sitter, patient resting comfortably in bed, patient is more awake/alert today, daughter at the bedside  REVIEW OF SYSTEMS:  Unable to obtain as patient is lethargic and sleepy  DRUG ALLERGIES:  No Known Allergies  VITALS:  Blood pressure 140/85, pulse 63, temperature 97.8 F (36.6 C), temperature source Oral, resp. rate 16, height 5' (1.524 m), weight 68.9 kg, SpO2 100 %.  PHYSICAL EXAMINATION:  GENERAL:  84 y.o.-year-old patient lying in the bed with no acute distress.  EYES: Pupils equal, round, reactive to light and accommodation. No scleral icterus. Extraocular muscles intact.  HEENT: Head atraumatic, normocephalic.  NG tube intact NECK:  Supple, no jugular venous distention. No thyroid enlargement, no tenderness.  LUNGS: Normal breath sounds bilaterally, no wheezing, rales,rhonchi or crepitation. No use of accessory muscles of respiration.  CARDIOVASCULAR: S1, S2 normal. No murmurs, rubs, or gallops.  ABDOMEN: Soft, minimal diffuse discomfort and distentded decreased bowel sounds.  EXTREMITIES: No pedal edema, cyanosis, or clubbing.  NEUROLOGIC: Arousable answers few questions gait not checked.  PSYCHIATRIC: The patient is alert and oriented x 2  SKIN: No obvious rash, lesion, or ulcer.    LABORATORY PANEL:   CBC Recent Labs  Lab 12/12/17 0335  WBC 10.2  HGB 10.9*  HCT 34.9*  PLT 454*   ------------------------------------------------------------------------------------------------------------------  Chemistries  Recent Labs  Lab 12/06/17 0605  12/11/17 0414  NA 133*   < > 137  K 5.2*   < > 4.2  CL 95*   < > 101  CO2 29   < > 27  GLUCOSE 165*   < > 128*  BUN 21   < > 6*  CREATININE 0.84   < > 1.04*  CALCIUM 8.9   < > 8.8*  AST 30  --   --   ALT 8  --   --   ALKPHOS 65  --   --    BILITOT 1.2  --   --    < > = values in this interval not displayed.   ------------------------------------------------------------------------------------------------------------------  Cardiac Enzymes Recent Labs  Lab 12/06/17 0605  TROPONINI <0.03   ------------------------------------------------------------------------------------------------------------------  RADIOLOGY:  Dg Abd Portable 1v  Result Date: 12/12/2017 CLINICAL DATA:  Small-bowel obstruction. History of colon cancer in diverticulosis. EXAM: PORTABLE ABDOMEN - 1 VIEW COMPARISON:  Abdominal plain film dated 12/10/2017. FINDINGS: Enteric tube appears adequately positioned in the stomach. Bowel gas pattern is nonobstructive. Moderate amount of stool in the colon. No evidence of free intraperitoneal air. Lung bases appear clear. No acute or suspicious osseous finding. IMPRESSION: Nonobstructive bowel gas pattern. Enteric tube is adequately positioned in the stomach. Electronically Signed   By: Franki Cabot M.D.   On: 12/12/2017 09:54    EKG:   Orders placed or performed during the hospital encounter of 12/06/17  . EKG 12-Lead  . EKG 12-Lead  . ED EKG  . ED EKG    ASSESSMENT AND PLAN:  84 year old elderly female patient with CKD stage III, hypertension, COPD,, hyperlipidemia, large ventral hernia currently under surgical service for nausea and vomiting and abdominal distention  *Acute complete bowel obstruction  Plan of care per primary service is conservative therapy with NG tube for decompression, IV fluids for rehydration, adult pain protocol   *Acute Aerococcus urinae UTI Resolving  On Zosyn, follow-up on  cultures   *Acute hypokalemia Repleted  *COPD without exacerbation Stable Continue BTs as needed  *Hyperlipidemia currently patient is n.p.o. in view of bowel obstruction  *Hypertensive chronic kidney disease stage III Stable Continue to avoid nephrotoxins and NSAIDs  Long-term prognosis is  poor, palliative care following Family do not want any aggressive measures such as laparotomy and surgery, okay with NG tube with suction for decompression of bowel  All the records are reviewed and case discussed with Care Management/Social Workerr. Management plans discussed with the patient, family and they are in agreement.  CODE STATUS: dnr  TOTAL TIME TAKING CARE OF THIS PATIENT: 35 minutes.   .  Note: This dictation was prepared with Dragon dictation along with smaller phrase technology. Any transcriptional errors that result from this process are unintentional.   Avel Peace Salary M.D on 12/12/2017 at 12:47 PM  Between 7am to 6pm - Pager - [phone] After 6pm go to [email or web address] - password [email or web address] [facility]  [facility]  Office  [phone]  CC: Primary care physician; Kirk Ruths, MD

## 2017-12-12 NOTE — Discharge Instructions (Signed)
In addition to included general instructions for Small Bowel Obstruction and Constipation,  Diet: Resume home heart healthy high-fiber diet.  Activity: Light activity and walking are encouraged (as able).  Medications: Resume all home medications. For chronic constipation, ensure adequate hydration. Take Twice Daily Colace stool softener (may reduce to once daily if loose BM's), along with Daily Metamucil (or similar fiber supplement). If constipation despite above regimen to reduce constipation, take As Needed: Miralax and/or Magnesium Citrate and/or Enema(s).

## 2017-12-12 NOTE — Progress Notes (Signed)
NG tube was clamped from 0720 to 1120. There was no output once reconnected to suction. NG tube was removed without complications. Patient tolerated well. Clear liquid diet has been ordered.

## 2017-12-13 MED ORDER — LOSARTAN POTASSIUM 25 MG PO TABS: 25.0000 mg | ORAL_TABLET | Freq: Every day | ORAL | Status: DC

## 2017-12-13 MED ORDER — ENSURE ENLIVE PO LIQD
237.0000 mL | Freq: Three times a day (TID) | ORAL | Status: DC
  Administered 2017-12-13: 237 mL via ORAL

## 2017-12-13 NOTE — Progress Notes (Signed)
[place] at [place] NAME: Beth Chen    MR#:  [id number]  DATE OF BIRTH:  1921/04/22  SUBJECTIVE:  CHIEF COMPLAINT:   Chief Complaint  Patient presents with  . Emesis  No events overnight, patient without complaint, daughter at the bedside, for possible discharge later today per nursing staff by primary service  REVIEW OF SYSTEMS:  CONSTITUTIONAL: No fever, fatigue or weakness.  EYES: No blurred or double vision.  EARS, NOSE, AND THROAT: No tinnitus or ear pain.  RESPIRATORY: No cough, shortness of breath, wheezing or hemoptysis.  CARDIOVASCULAR: No chest pain, orthopnea, edema.  GASTROINTESTINAL: No nausea, vomiting, diarrhea or abdominal pain.  GENITOURINARY: No dysuria, hematuria.  ENDOCRINE: No polyuria, nocturia,  HEMATOLOGY: No anemia, easy bruising or bleeding SKIN: No rash or lesion. MUSCULOSKELETAL: No joint pain or arthritis.   NEUROLOGIC: No tingling, numbness, weakness.  PSYCHIATRY: No anxiety or depression.   ROS  DRUG ALLERGIES:  No Known Allergies  VITALS:  Blood pressure (!) 154/78, pulse 65, temperature 98.3 F (36.8 C), temperature source Oral, resp. rate 14, height 5' (1.524 m), weight 68.9 kg, SpO2 98 %.  PHYSICAL EXAMINATION:  GENERAL:  84 y.o.-year-old patient lying in the bed with no acute distress.  EYES: Pupils equal, round, reactive to light and accommodation. No scleral icterus. Extraocular muscles intact.  HEENT: Head atraumatic, normocephalic. Oropharynx and nasopharynx clear.  NECK:  Supple, no jugular venous distention. No thyroid enlargement, no tenderness.  LUNGS: Normal breath sounds bilaterally, no wheezing, rales,rhonchi or crepitation. No use of accessory muscles of respiration.  CARDIOVASCULAR: S1, S2 normal. No murmurs, rubs, or gallops.  ABDOMEN: Soft, nontender, nondistended. Bowel sounds present. No organomegaly or mass.  EXTREMITIES: No pedal edema, cyanosis, or clubbing.  NEUROLOGIC:  Cranial nerves II through XII are intact. Muscle strength 5/5 in all extremities. Sensation intact. Gait not checked.  PSYCHIATRIC: The patient is alert and oriented x 3.  SKIN: No obvious rash, lesion, or ulcer.   Physical Exam LABORATORY PANEL:   CBC Recent Labs  Lab 12/12/17 0335  WBC 10.2  HGB 10.9*  HCT 34.9*  PLT 454*   ------------------------------------------------------------------------------------------------------------------  Chemistries  Recent Labs  Lab 12/11/17 0414  NA 137  K 4.2  CL 101  CO2 27  GLUCOSE 128*  BUN 6*  CREATININE 1.04*  CALCIUM 8.8*   ------------------------------------------------------------------------------------------------------------------  Cardiac Enzymes No results for input(s): TROPONINI in the last 168 hours. ------------------------------------------------------------------------------------------------------------------  RADIOLOGY:  Dg Abd Portable 1v  Result Date: 12/12/2017 CLINICAL DATA:  Small-bowel obstruction. History of colon cancer in diverticulosis. EXAM: PORTABLE ABDOMEN - 1 VIEW COMPARISON:  Abdominal plain film dated 12/10/2017. FINDINGS: Enteric tube appears adequately positioned in the stomach. Bowel gas pattern is nonobstructive. Moderate amount of stool in the colon. No evidence of free intraperitoneal air. Lung bases appear clear. No acute or suspicious osseous finding. IMPRESSION: Nonobstructive bowel gas pattern. Enteric tube is adequately positioned in the stomach. Electronically Signed   By: Franki Cabot M.D.   On: 12/12/2017 09:54    ASSESSMENT AND PLAN:   84 year old elderly female patient with CKD stage III, hypertension, COPD,, hyperlipidemia, large ventral hernia currently under surgical service for nausea and vomiting and abdominal distention  *Acute complete bowel obstruction  Resolved with conservative therapy  *Acute Aerococcus urinae UTI Treated with course of IV Zosyn-May discontinue  antibiotics  *Acute hypokalemia Repleted  *COPD without exacerbation Stable Continue BTs as needed  *Hyperlipidemia Okay to restart statin therapy at discharge  *  Hypertensive chronic kidney disease stage III Stable Continue to avoid nephrotoxins and NSAIDs  Long-term prognosis is poor, palliative care consulted while in house  Family do not want any aggressive measures  All the records are reviewed and case discussed with Care Management/Social Workerr. Management plans discussed with the patient, family and they are in agreement.  CODE STATUS: dnr  TOTAL TIME TAKING CARE OF THIS PATIENT: 40 minutes.     POSSIBLE D/C IN 1 DAYS, DEPENDING ON CLINICAL CONDITION.   Avel Peace Beth Chen M.D on 12/13/2017   Between 7am to 6pm - Pager - [phone]  After 6pm go to [email or web address] - password [email or web address] [facility]  Office  [phone]  CC: Primary care physician; Kirk Ruths, MD  Note: This dictation was prepared with Dragon dictation along with smaller phrase technology. Any transcriptional errors that result from this process are unintentional.

## 2017-12-13 NOTE — Progress Notes (Signed)
Physical Therapy Treatment Patient Details Name: Beth Chen MRN: [id number] DOB: 07-17-1921 Today's Date: 12/13/2017    History of Present Illness Pt is a 84 y.o female presenting with nausea and vomitting. NG tube placed on 12/06/17 for small bowel obstruction. Pt previously a long term resident at [facility] and is wheelchair bound. PMH significant for colon cancer, HTN, and asthma.     PT Comments    Pt with increased fatigue this afternoon however eager to participate in treatment. Pt with good tolerance to exercises able to perform the majority actively without assist however she continues to have the most difficulty with her R knee due to prior history of limitations. Pt frequently closing her eyes during treatment d/t fatigue however still eager to get to seated on edge of bed. Increased difficulty during supine to sit d/t fatigue however once seated pt feeling less tired. Pt able to tolerate about 10 minutes of sitting independently with exercises performed. Pt would continue to benefit from skilled PT in STR to improve functional limitations and progress for improved QOL.   Follow Up Recommendations  SNF     Equipment Recommendations       Recommendations for Other Services       Precautions / Restrictions Precautions Precautions: Fall Restrictions Weight Bearing Restrictions: No    Mobility  Bed Mobility Overal bed mobility: Needs Assistance Bed Mobility: Supine to Sit;Sit to Supine     Supine to sit: Mod assist Sit to supine: Mod assist   General bed mobility comments: Pt requiring increased assistance for supine to sit due to pt significantly fatigued this afternoon. Pt able to reach with UE and pull trunk part way but needing mod assist to fully achieve upright. Mod assist need to bring LE back to bed and position hips and trunks in supine.   Transfers                 General transfer comment: not perfromed this date pt fatigued  Ambulation/Gait              General Gait Details: not perfromed this date pt fatigued   Stairs             Wheelchair Mobility    Modified Rankin (Stroke Patients Only)       Balance Overall balance assessment: Needs assistance Sitting-balance support: Feet supported;Single extremity supported Sitting balance-Leahy Scale: Fair Sitting balance - Comments: pt intially leaning posteriorly however after VC to sit tall pt able to maitnain sitting balance independently for a few minutes with use of BUE for support       Standing balance comment: not perfromed this date pt fatigued                            Cognition Arousal/Alertness: Awake/alert Behavior During Therapy: WFL for tasks assessed/performed Overall Cognitive Status: Within Functional Limits for tasks assessed                                        Exercises General Exercises - Lower Extremity Quad Sets: AROM;Both;10 reps;Supine Short Arc Quad: AROM;Both;10 reps;Supine Long Arc Quad: AROM;Both;10 reps;Seated Heel Slides: AAROM;Both;10 reps;Supine Hip ABduction/ADduction: AROM;Both;10 reps;Supine Straight Leg Raises: AAROM;Both;10 reps;Supine Hip Flexion/Marching: AROM;Both;10 reps;Supine;Seated    General Comments        Pertinent Vitals/Pain Pain Assessment: No/denies pain    Home  Living                      Prior Function            PT Goals (current goals can now be found in the care plan section)      Frequency    Min 2X/week      PT Plan Current plan remains appropriate    Co-evaluation              AM-PAC PT "6 Clicks" Daily Activity  Outcome Measure  Difficulty turning over in bed (including adjusting bedclothes, sheets and blankets)?: A Little Difficulty moving from lying on back to sitting on the side of the bed? : A Lot Difficulty sitting down on and standing up from a chair with arms (e.g., wheelchair, bedside commode, etc,.)?: Unable Help  needed moving to and from a bed to chair (including a wheelchair)?: Total Help needed walking in hospital room?: Total Help needed climbing 3-5 steps with a railing? : Total 6 Click Score: 9    End of Session Equipment Utilized During Treatment: Gait belt Activity Tolerance: Patient limited by fatigue Patient left: in bed;with call bell/phone within reach;with bed alarm set;with family/visitor present Nurse Communication: Mobility status PT Visit Diagnosis: Unsteadiness on feet (R26.81);Muscle weakness (generalized) (M62.81);Difficulty in walking, not elsewhere classified (R26.2)     Time: 9728-2060 PT Time Calculation (min) (ACUTE ONLY): 28 min  Charges:                        Ernie Avena, SPT 12/13/2017, 4:34 PM

## 2017-12-13 NOTE — Progress Notes (Signed)
Daily Progress Note   Patient Name: Beth Chen       Date: 12/13/2017 DOB: 1921-05-26  Age: 84 y.o. MRN#: [id number] Attending Physician: Vickie Epley, MD Primary Care Physician: Kirk Ruths, MD Admit Date: 12/06/2017  Reason for Consultation/Follow-up: Establishing goals of care  Subjective:  Patient is sitting in bed. She states she feels fine.  NGT has been removed. She tolerated liquids yesterday. Regular diet in place. Per daughter, plans to D/C back to facility today. Discussed the difference between hospice and palliative. Daughter [place] would like to continue to treat the treatable. Recommend palliative at D/C.    Length of Stay: 7  Current Medications: Scheduled Meds:  . feeding supplement (ENSURE ENLIVE)  237 mL Oral TID BM  . heparin  5,000 Units Subcutaneous Q8H  . sorbitol, milk of mag, mineral oil, glycerin (SMOG) enema  960 mL Rectal Daily    Continuous Infusions: . sodium chloride Stopped (12/12/17 1738)  . dextrose 5 % and 0.45 % NaCl with KCl 20 mEq/L 75 mL/hr at 12/13/17 1125  . famotidine (PEPCID) IV Stopped (12/13/17 0920)  . piperacillin-tazobactam (ZOSYN)  IV 12.5 mL/hr at 12/13/17 1125    PRN Meds: sodium chloride, polyethylene glycol  Physical Exam  Constitutional: No distress.  Pulmonary/Chest: Effort normal.  Neurological: She is alert.            Vital Signs: BP (!) 154/78 (BP Location: Left Arm)   Pulse 65   Temp 98.3 F (36.8 C) (Oral)   Resp 14   Ht 5' (1.524 m)   Wt 68.9 kg   SpO2 98%   BMI 29.67 kg/m  SpO2: SpO2: 98 % O2 Device: O2 Device: Room Air O2 Flow Rate:    Intake/output summary:   Intake/Output Summary (Last 24 hours) at 12/13/2017 1241 Last data filed at 12/13/2017 1125 Gross per 24 hour  Intake 2647.51  ml  Output -  Net 2647.51 ml   LBM: Last BM Date: 12/13/17 Baseline Weight: Weight: 68.9 kg Most recent weight: Weight: 68.9 kg       Palliative Assessment/Data: 40%    Flowsheet Rows     Most Recent Value  Intake Tab  Referral Department  Hospitalist  Unit at Time of Referral  Med/Surg Unit  Palliative Care Primary Diagnosis  Cancer  Date Notified  12/09/17  Palliative Care Type  New Palliative care  Reason for referral  Clarify Goals of Care  Date of Admission  12/06/17  Date first seen by Palliative Care  12/09/17  # of days Palliative referral response time  0 Day(s)  # of days IP prior to Palliative referral  3  Clinical Assessment  Psychosocial & Spiritual Assessment  Palliative Care Outcomes      Patient Active Problem List   Diagnosis Date Noted  . Intestinal adhesions with complete obstruction ([place]) 12/06/2017  . Essential hypertension, benign 11/20/2017  . Frequent UTI 11/20/2017  . Primary osteoarthritis of right knee 11/20/2017  . Protein-calorie malnutrition ([place]) 08/30/2017  . Reflux esophagitis   . Anemia 05/30/2017  . Generalized weakness 05/20/2017  . Lower GI bleed 09/07/2016  . GI bleed 09/05/2016  . Allergic rhinitis 11/18/2013  . Asthma with COPD (chronic obstructive pulmonary disease) ([place]) 11/18/2013  . Borderline hypothyroidism 11/18/2013  . Complete left bundle branch block (LBBB) 11/18/2013  . Constipation 11/18/2013  . Hypertensive kidney disease with CKD stage III ([place]) 11/18/2013    Palliative Care Assessment & Plan    Recommendations/Plan:  Recommend D/C with palliative to follow.   Code Status:    Code Status Orders  (From admission, onward)         Start     Ordered   12/06/17 1426  Do not attempt resuscitation (DNR)  Continuous    Question Answer Comment  In the event of cardiac or respiratory ARREST Do not call a "code blue"   In the event of cardiac or respiratory ARREST Do not perform Intubation, CPR,  defibrillation or ACLS   In the event of cardiac or respiratory ARREST Use medication by any route, position, wound care, and other measures to relive pain and suffering. May use oxygen, suction and manual treatment of airway obstruction as needed for comfort.      12/06/17 1425        Code Status History    Date Active Date Inactive Code Status Order ID Comments User Context   12/06/2017 1110 12/06/2017 1424 Full Code [id number]  Vickie Epley, MD ED   05/30/2017 1629 06/02/2017 1825 DNR [id number]  Gladstone Lighter, MD ED   09/05/2016 2050 09/08/2016 1631 DNR [id number]  Vaughan Basta, MD Inpatient    Advance Directive Documentation     Most Recent Value  Type of Advance Directive  Healthcare Power of [place], Out of facility DNR (pink MOST or yellow form)  Pre-existing out of facility DNR order (yellow form or pink MOST form)  -  "MOST" Form in Place?  -       Prognosis:   Unable to determine This depends on oral intake and how she progresses.   Discharge Planning:  To Be Determined   Thank you for allowing the Palliative Medicine Team to assist in the care of this patient.   Total Time 15 min Prolonged Time Billed  no      Greater than 50%  of this time was spent counseling and coordinating care related to the above assessment and plan.  Asencion Gowda, NP  Please contact Palliative Medicine Team phone at [phone] for questions and concerns.

## 2017-12-13 NOTE — Care Management Important Message (Signed)
Copy of signed IM left with patient in room.  

## 2017-12-13 NOTE — Discharge Summary (Addendum)
Patient seen and examined as described below with surgical PA-C, Ardell Isaacs.  I have personally reviewed the patient's chart, evaluated/examined the patient, proposed the recommended management, and discussed these recommendations with the patient and her family to their expressed satisfaction.  -- Marilynne Drivers Rosana Hoes, MD, [place]: [facility] - Partnering for exceptional care. Office: [phone]  Discharge Summary  Patient ID: Beth Chen MRN: [id number] DOB/AGE: Aug 16, 1921 84 y.o.  Admit date: 12/06/2017 Discharge date: 12/13/2017  Discharge Diagnoses Patient Active Problem List   Diagnosis Date Noted  . Intestinal adhesions with complete obstruction ([place]) 12/06/2017  . Essential hypertension, benign 11/20/2017  . Frequent UTI 11/20/2017  . Primary osteoarthritis of right knee 11/20/2017  . Protein-calorie malnutrition ([place]) 08/30/2017  . Reflux esophagitis   . Anemia 05/30/2017  . Generalized weakness 05/20/2017  . Lower GI bleed 09/07/2016  . GI bleed 09/05/2016  . Allergic rhinitis 11/18/2013  . Asthma with COPD (chronic obstructive pulmonary disease) ([place]) 11/18/2013  . Borderline hypothyroidism 11/18/2013  . Complete left bundle branch block (LBBB) 11/18/2013  . Constipation 11/18/2013  . Hypertensive kidney disease with CKD stage III ([place]) 11/18/2013    Consultants Medicine  Procedures None  HPI: 84 y.o.femalepresented [facility] for evaluation of nausea and emesis.Patient reports 3 days of nausea and emesis which she described as brown in color. She denied abdominal pain. She has been able to tolerate PO intake a few times in the last 3 days. No fevers, chills, CP, SOB, diarrhea, urinary changes, leg pain or swelling. She reports a known history of right sided abdominal hernia for the last 20 years. Previous abdominal surgeries include abdominal hysterectomy and hemicolectomy in 2006 secondary to colon cancer. In  the ED this morning KUB and CT were concerning for right lateral abdominal hernia with possible obstruction vs incarceration. Lab work was concerning for leukocytosis of 32.7.General surgery was consulted for management of small bowel obstruction and she was admitted to the general surgery service with medicine consulting.   Hospital Course: On the day of presentation (10/07), she had an NGT placed and was connected to intermittent wall suction, made NPO, and started on IVF. Overnight on 10/08, the patient became agitated and pulled out her NGT and her IVs, which were replaced on 10/09 and safety precautions were initiated to ensure this did not occur again. Her NGT remained in place and she was NPO until 10/13. She was followed with intermittent abdominal XRs to ensure improvement given her clinical history of dementia. On 10/13, her NGT was removed and she was started on clear liquids which were advance to full liquids that evening, and a soft diet the morning of 10/14, which she tolerated well. On the day of discharge (12/13/17), she was tolerating a diet and appeared to be clinically improved. Management of bowel regimen and follow up care was discussed with the patients daughter. All the patient and her daughter's questions were addressed and answered.   Physical Exam:  Const: Well-appearing female, NAD HEENT: no scleral icterus Pulm: CTAB Card: HRRR, no m/r/g Abdomen: soft, non-tender, and non-distended, large right lateral incisional hernia, easily reducible MSK: No peripheral edema    Allergies as of 12/13/2017   No Known Allergies     Medication List    TAKE these medications   acetaminophen 325 MG tablet Commonly known as:  TYLENOL Take 650 mg by mouth every 4 (four) hours as needed. for pain/ increased temp. May be administered orally, per G-tube  if needed or rectally if unable to swallow (separate order). Maximum dose for 24 hours is 3,000 mg from all sources of Acetaminophen/  Tylenol   bisacodyl 10 MG suppository Commonly known as:  DULCOLAX Place 10 mg rectally 2 (two) times daily as needed.   calcium carbonate 750 MG chewable tablet Commonly known as:  TUMS EX Chew 2 tablets by mouth daily.   Camphor-Menthol-Methyl Sal 06-09-28 % Crea Apply 1 application topically 3 (three) times daily as needed. Apply thin film of topical to pain site. Okay to leave on bed side.   diclofenac sodium 1 % Gel Commonly known as:  VOLTAREN Apply 4 g topically 4 (four) times daily. Apply 4 grams to the right knee QID for pain,   docusate sodium 100 MG capsule Commonly known as:  COLACE Take 1 capsule (100 mg total) by mouth daily as needed.   ENSURE ENLIVE PO Take 1 Bottle by mouth daily.   feeding supplement (PRO-STAT SUGAR FREE 64) Liqd Take 30 mLs by mouth 2 (two) times daily between meals. low protein, low albumin   fluticasone 50 MCG/ACT nasal spray Commonly known as:  FLONASE Place 2 sprays into both nostrils daily.   fluticasone furoate-vilanterol 100-25 MCG/INH Aepb Commonly known as:  BREO ELLIPTA Inhale 1 puff into the lungs daily.   ipratropium-albuterol 0.5-2.5 (3) MG/3ML Soln Commonly known as:  DUONEB Take 3 mLs by nebulization 3 (three) times daily.   lactulose 10 GM/15ML solution Commonly known as:  CHRONULAC Take 30 g by mouth 2 (two) times daily as needed.   losartan 25 MG tablet Commonly known as:  COZAAR Take 25 mg by mouth daily as needed (BP >150/90).   NON FORMULARY Diet Type - Regular   nystatin-triamcinolone cream Commonly known as:  MYCOLOG II Apply 1 application topically 2 (two) times daily. apply nystatin cream under the right breast to treat yeast infection/irritation   pantoprazole 40 MG tablet Commonly known as:  PROTONIX Take 40 mg by mouth daily. before a meal to help with esophagus problems   polyethylene glycol packet Commonly known as:  MIRALAX / GLYCOLAX Take 17 g by mouth daily.   RISA-BID PROBIOTIC  Tabs Take 1 tablet by mouth 2 (two) times daily.   sodium phosphate 7-19 GM/118ML Enem Place 1 enema rectally every three (3) days as needed for mild constipation or moderate constipation. Once every 3 days as needed   trimethoprim 100 MG tablet Commonly known as:  TRIMPEX Take 100 mg by mouth daily.         Contact information for follow-up providers    Vickie Epley, MD Follow up.   Specialty:  General Surgery Why:  as needed Contact information: [address] [place] [address] [phone]            Contact information for after-discharge care    Destination    [facility]erred [facility] .   Service:  Skilled Nursing Contact information: [address] [place] [phone]                  Signed: Edison Simon , PA-C  [facility]  12/13/2017, 3:21 PM [phone] M-F: 7am - 4pm

## 2017-12-13 NOTE — Clinical Social Work Note (Signed)
Patient is medically ready for discharge back to [facility] today. CSW notified patient and daughter Saundra Gin [phone] of discharge back to [facility] today. CSW spoke with Lovena Le at [facility] and she states that patient will go to room 348 and they have received authorization. Patient will be transported by EMS. RN to call report and call for transport.   [place], [place]

## 2017-12-16 ENCOUNTER — Other Ambulatory Visit
Admission: RE | Admit: 2017-12-16 | Discharge: 2017-12-16 | Disposition: A | Discharge status: Home / Self Care | Payer: Medicare Other | Source: Skilled Nursing Facility | Attending: Adult Health | Admitting: Adult Health

## 2017-12-16 DIAGNOSIS — N183 Chronic kidney disease, stage 3 (moderate): Secondary | ICD-10-CM | POA: Diagnosis present

## 2017-12-16 LAB — COMPREHENSIVE METABOLIC PANEL
ALK PHOS: 66 U/L (ref 38–126)
ALT: 25 U/L (ref 0–44)
AST: 30 U/L (ref 15–41)
Albumin: 3.3 g/dL — ABNORMAL LOW (ref 3.5–5.0)
Anion gap: 7 (ref 5–15)
BILIRUBIN TOTAL: 0.8 mg/dL (ref 0.3–1.2)
BUN: 23 mg/dL (ref 8–23)
CALCIUM: 9 mg/dL (ref 8.9–10.3)
CO2: 28 mmol/L (ref 22–32)
CREATININE: 0.94 mg/dL (ref 0.44–1.00)
Chloride: 97 mmol/L — ABNORMAL LOW (ref 98–111)
GFR calc Af Amer: 58 mL/min — ABNORMAL LOW (ref >60)
GFR calc non Af Amer: 50 mL/min — ABNORMAL LOW (ref >60)
GLUCOSE: 88 mg/dL (ref 70–99)
Potassium: 4.9 mmol/L (ref 3.5–5.1)
SODIUM: 132 mmol/L — AB (ref 135–145)
Total Protein: 6.3 g/dL — ABNORMAL LOW (ref 6.5–8.1)

## 2017-12-21 ENCOUNTER — Non-Acute Institutional Stay (SKILLED_NURSING_FACILITY): Payer: Medicare Other | Admitting: Adult Health

## 2017-12-21 DIAGNOSIS — J189 Pneumonia, unspecified organism: Secondary | ICD-10-CM

## 2017-12-21 DIAGNOSIS — J449 Chronic obstructive pulmonary disease, unspecified: Secondary | ICD-10-CM | POA: Diagnosis not present

## 2017-12-21 NOTE — Progress Notes (Signed)
Location:  [facility] Number: [id number] Place of Service:  SNF (31) Provider:  Durenda Age, NP  Patient Care Team: Kirk Ruths, MD as PCP - General (Internal Medicine)  Extended Emergency Contact Information Primary Emergency Contact: [facility] Address: [place], [place] [address] John[place]te Litter of [place] Phone: [phone] Mobile Phone: [phone] Relation: Daughter Secondary Emergency Contact: Costella Hatcher [place] Phone: [phone] Mobile Phone: [phone] Relation: Son  Code Status:  DNR  Goals of care: Advanced Directive information Advanced Directives 12/23/2017  Does Patient Have a Medical Advance Directive? Yes  Type of Advance Directive Out of facility DNR (pink MOST or yellow form)  Does patient want to make changes to medical advance directive? No - Patient declined  Copy of [place] in Chart? No - copy requested  Would patient like information on creating a medical advance directive? -  Pre-existing out of facility DNR order (yellow form or pink MOST form) -     Chief Complaint  Patient presents with  . Acute Visit    Patient seen for transfer to Hospice    HPI:  Pt is a 84 y.o. female seen today for an acute visit to transfer to hospice services.  She has a PMH of CKD, COPD, allergic rhinitis, and cholelithiasis. She was reported to have productive cough. Chest x-ray revealed small patchy area of atelectasis or pneumonia on the right upper lobe.  No reported fever.    Past Medical History:  Diagnosis Date  . Allergic rhinitis 11/18/2013  . Asthma with COPD (chronic obstructive pulmonary disease) ([place]) 11/18/2013   Last Assessment & Plan:  No flair ups noted recently and breathing is essentially at baseline.    . Asthma without status asthmaticus    unspecified  . Borderline hypothyroidism 11/18/2013   Last Assessment & Plan:   Energy and tsh are doing well  . Cancer [facility])    colon cancer- surgically removed in 2006  . Cholelithiases   . Chronic kidney disease   . Colon cancer ([place])   . Complete left bundle branch block (LBBB) 11/18/2013  . Constipation 11/18/2013  . Diverticulosis   . Hiatal hernia    right  . HLD (hyperlipidemia) 11/18/2013   Last Assessment & Plan:  Appropriate diet is followed and no myalgia's are noted.    . Hyperlipidemia, unspecified   . Hypertensive kidney disease with CKD stage III ([place]) 11/18/2013   Last Assessment & Plan:  Is compliant with hypertensive medications without clear side effects or lack of control.  Nausea and itching are not symptomatic and nsaids are being avoided.    . Melanoma ([place])   . Osteopenia    Past Surgical History:  Procedure Laterality Date  . ABDOMINAL HYSTERECTOMY    . Bladder Lift    . BREAST EXCISIONAL BIOPSY Left 15+ yrs ago   neg  . COLON RESECTION  2006   colon cancer  . COLON SURGERY    . COLON SURGERY Right   . COLONOSCOPY WITH PROPOFOL N/A 06/01/2017   Procedure: COLONOSCOPY WITH PROPOFOL;  Surgeon: [name], MD;  Location: [facility] ENDOSCOPY;  Service: Endoscopy;  Laterality: N/A;  . ESOPHAGOGASTRODUODENOSCOPY (EGD) WITH PROPOFOL N/A 06/01/2017   Procedure: ESOPHAGOGASTRODUODENOSCOPY (EGD) WITH PROPOFOL;  Surgeon: [name], MD;  Location: [facility] ENDOSCOPY;  Service: Endoscopy;  Laterality: N/A;  . HEMICOLECTOMY Right   . SKIN CANCER EXCISION  2006   melanoma    No Known Allergies  Outpatient Encounter Medications as of 12/21/2017  Medication Sig  . acetaminophen (TYLENOL) 325 MG tablet Take 650 mg by mouth every 4 (four) hours as needed. for pain/ increased temp. May be administered orally, per G-tube if needed or rectally if unable to swallow (separate order). Maximum dose for 24 hours is 3,000 mg from all sources of Acetaminophen/ Tylenol  . Amino Acids-Protein Hydrolys (FEEDING SUPPLEMENT, PRO-STAT SUGAR FREE 64,) LIQD Take 30 mLs by  mouth 2 (two) times daily between meals. low protein, low albumin  . bisacodyl (DULCOLAX) 10 MG suppository Place 10 mg rectally 2 (two) times daily as needed.  . calcium carbonate (TUMS EX) 750 MG chewable tablet Chew 2 tablets by mouth daily.   . Camphor-Menthol-Methyl Sal 06-09-28 % CREA Apply 1 application topically 3 (three) times daily as needed. Apply thin film of topical to pain site. Okay to leave on bed side.  . diclofenac sodium (VOLTAREN) 1 % GEL Apply 4 g topically 4 (four) times daily. Apply 4 grams to the right knee QID for pain,  . docusate sodium (COLACE) 100 MG capsule Take 1 capsule (100 mg total) by mouth daily as needed.  . fluticasone (FLONASE) 50 MCG/ACT nasal spray Place 2 sprays into both nostrils daily.   . fluticasone furoate-vilanterol (BREO ELLIPTA) 100-25 MCG/INH AEPB Inhale 1 puff into the lungs daily.   Marland Kitchen ipratropium-albuterol (DUONEB) 0.5-2.5 (3) MG/3ML SOLN Take 3 mLs by nebulization 3 (three) times daily.   Marland Kitchen lactulose (CHRONULAC) 10 GM/15ML solution Take 30 g by mouth 2 (two) times daily as needed.  Marland Kitchen losartan (COZAAR) 25 MG tablet Take 25 mg by mouth daily as needed (BP >150/90).   . NON FORMULARY Diet Type - Regular  . Nutritional Supplements (ENSURE ENLIVE PO) Take 1 Bottle by mouth 3 (three) times daily.   Marland Kitchen nystatin-triamcinolone (MYCOLOG II) cream Apply 1 application topically 2 (two) times daily. apply nystatin cream under the right breast to treat yeast infection/irritation  . pantoprazole (PROTONIX) 40 MG tablet Take 40 mg by mouth daily. before a meal to help with esophagus problems   . polyethylene glycol (MIRALAX / GLYCOLAX) packet Take 17 g by mouth daily.  . Probiotic Product (RISA-BID PROBIOTIC) TABS Take 1 tablet by mouth 2 (two) times daily.  . sodium phosphate (FLEET) 7-19 GM/118ML ENEM Place 1 enema rectally every three (3) days as needed for mild constipation or moderate constipation. Once every 3 days as needed   . trimethoprim (TRIMPEX) 100  MG tablet Take 100 mg by mouth daily.    No facility-administered encounter medications on file as of 12/21/2017.     Review of Systems  GENERAL: No change in appetite, no fatigue, no weight changes, no fever, chills or weakness MOUTH and THROAT: Denies oral discomfort, gingival pain or bleeding, pain from teeth or hoarseness   RESPIRATORY: +cough CARDIAC: No chest pain, edema or palpitations GI: No abdominal pain, diarrhea, constipation, heart burn, nausea or vomiting GU: Denies dysuria, frequency, hematuria, incontinence, or discharge PSYCHIATRIC: Denies feelings of depression or anxiety. No report of hallucinations, insomnia, paranoia, or agitation   Immunization History  Administered Date(s) Administered  . Influenza Inj Mdck Quad Pf 11/22/2017  . Influenza, High Dose Seasonal PF 12/09/2017  . PPD Test 09/09/2016, 05/30/2017  . Pneumococcal Conjugate-13 12/16/2016  . Pneumococcal-Unspecified 11/28/2008   Pertinent  Health Maintenance Due  Topic Date Due  . INFLUENZA VACCINE  Completed  . PNA vac Low  Risk Adult  Completed  . DEXA SCAN  Discontinued      Vitals:   12/23/17 1210  BP: 120/60  Pulse: 78  Resp: 20  Temp: 97.6 F (36.4 C)  TempSrc: Oral  SpO2: 93%  Weight: 136 lb 8 oz (61.9 kg)  Height: 5' (1.524 m)   Body mass index is 26.66 kg/m.  Physical Exam  GENERAL APPEARANCE: Well nourished. In no acute distress. Normal body habitus SKIN:  Skin is warm and dry.  MOUTH and THROAT: Lips are without lesions. Oral mucosa is moist and without lesions. Tongue is normal in shape, size, and color and without lesions RESPIRATORY: +wheezing CARDIAC: RRR, no murmur,no extra heart sounds, no edema GI: Abdomen soft, normal BS, no masses, no tenderness EXTREMITIES:  Able to move X 4 extremities PSYCHIATRIC: Alert to self, disoriented to time and place. Affect and behavior are appropriate   Labs reviewed: Recent Labs    04/20/17 1505  12/10/17 0515 12/11/17 0414  12/16/17 1345  NA 133*   < > 136 137 132*  K 3.6   < > 3.2* 4.2 4.9  CL 93*   < > 100 101 97*  CO2 29   < > 28 27 28   GLUCOSE 116*   < > 121* 128* 88  BUN 39*   < > 5* 6* 23  CREATININE 0.93   < > 0.96 1.04* 0.94  CALCIUM 10.0   < > 8.6* 8.8* 9.0  MG 1.8  --   --   --   --    < > = values in this interval not displayed.   Recent Labs    11/24/17 0600 12/06/17 0605 12/16/17 1345  AST 21 30 30   ALT 9 8 25   ALKPHOS 60 65 66  BILITOT 0.5 1.2 0.8  PROT 5.7* 6.6 6.3*  ALBUMIN 2.9* 3.2* 3.3*   Recent Labs    09/15/17 1441 10/01/17 0600 11/24/17 0600  12/07/17 0427 12/08/17 0449 12/12/17 0335  WBC 13.2* 11.7* 12.5*   < > 15.5* 10.8* 10.2  NEUTROABS 10.1* 8.7* 10.5*  --   --   --   --   HGB 12.9 12.5 11.5*   < > 9.7* 9.3* 10.9*  HCT 38.5 37.7 33.8*   < > 29.3* 29.9* 34.9*  MCV 85.1 85.2 85.8   < > 84.9 87.2 86.0  PLT 363 321 431   < > 465* 429* 454*   < > = values in this interval not displayed.   Lab Results  Component Value Date   TSH 3.269 04/20/2017   No results found for: HGBA1C Lab Results  Component Value Date   CHOL 127 04/20/2017   HDL 74 04/20/2017   LDLCALC 40 04/20/2017   TRIG 66 04/20/2017   CHOLHDL 1.7 04/20/2017    Significant Diagnostic Results in last 30 days:  Dg Abdomen 1 View  Result Date: 12/06/2017 CLINICAL DATA:  Vomiting. EXAM: ABDOMEN - 1 VIEW COMPARISON:  Radiographs and CT scan of April 14, 2017. FINDINGS: Large amount of stool seen throughout the colon. Probable dilated small bowel loop is seen in right side of the abdomen. No radio-opaque calculi or other significant radiographic abnormality are seen. IMPRESSION: Probable dilated small bowel loop seen in right side of abdomen which may represent focal ileus or potentially small bowel obstruction. Potentially this bowel loop may be located within right lower quadrant hernia seen on prior CT scan. CT scan may be performed for further evaluation. Electronically Signed  By: Marijo Conception, M.D.   On: 12/06/2017 07:48   Ct Abdomen Pelvis W Contrast  Result Date: 12/06/2017 CLINICAL DATA:  Acute right-sided abdominal pain. EXAM: CT ABDOMEN AND PELVIS WITH CONTRAST TECHNIQUE: Multidetector CT imaging of the abdomen and pelvis was performed using the standard protocol following bolus administration of intravenous contrast. CONTRAST:  32mL OMNIPAQUE IOHEXOL 300 MG/ML  SOLN COMPARISON:  CT scan of April 14, 2017. FINDINGS: Lower chest: Moderate size sliding-type hiatal hernia is noted. Visualized lung bases are unremarkable. Hepatobiliary: Mild cholelithiasis is noted without inflammation. No biliary dilatation is noted. Liver is unremarkable. Pancreas: Unremarkable. No pancreatic ductal dilatation or surrounding inflammatory changes. Spleen: Normal in size without focal abnormality. Adrenals/Urinary Tract: Adrenal glands and kidneys appear normal. No hydronephrosis or renal obstruction is noted. No renal or ureteral calculi are noted. Urinary bladder is mildly distended. Stomach/Bowel: Large amount of stool seen in the descending colon and rectum. Diverticulosis of the sigmoid colon is noted without inflammation. Large right lateral hernia is noted in right lower quadrant which contains several dilated loops of small bowel, with dilatation of more proximal small bowel loops suggesting obstruction. Vascular/Lymphatic: Aortic atherosclerosis. No enlarged abdominal or pelvic lymph nodes. Reproductive: Status post hysterectomy. No adnexal masses. Other: No ascites is noted. Musculoskeletal: Old L1 compression fracture is noted. No acute osseous abnormality is noted. IMPRESSION: Large right lateral hernia is noted in right lower quadrant of the abdomen which contains several loops of dilated small bowel, with dilatation of more proximal small bowel loops consistent with probable incarceration and obstruction. Moderate size sliding-type hiatal hernia. Sigmoid diverticulosis without inflammation. Mild  urinary bladder distention. Cholelithiasis. Aortic Atherosclerosis (ICD10-I70.0). Electronically Signed   By: Marijo Conception, M.D.   On: 12/06/2017 08:46   Dg Chest Portable 1 View  Result Date: 12/06/2017 CLINICAL DATA:  Wheezing.  Vomiting. EXAM: PORTABLE CHEST 1 VIEW COMPARISON:  Radiographs 07/13/2017 FINDINGS: Cardiomediastinal contours are unchanged with mild cardiomegaly and retrocardiac hiatal hernia. Coarse lung markings appear chronic. Pulmonary vasculature is normal. No consolidation, pleural effusion, or pneumothorax. No acute osseous abnormalities are seen. Remote right rib fractures. IMPRESSION: No acute findings. Electronically Signed   By: Keith Rake M.D.   On: 12/06/2017 06:59   Dg Abd 2 Views  Result Date: 12/10/2017 CLINICAL DATA:  Small-bowel obstruction, nasogastric tube in place, history colon cancer, diverticulosis, chronic kidney disease EXAM: ABDOMEN - 2 VIEW COMPARISON:  12/07/2017 FINDINGS: Nasogastric tube coiled in mid stomach. Increased stool in colon and rectum. No small bowel dilatation or bowel wall thickening identified. Bones demineralized. No definite urinary tract calcification. IMPRESSION: Increased stool in colon. No small bowel distention seen. Electronically Signed   By: Lavonia Dana M.D.   On: 12/10/2017 10:37   Dg Abd Portable 1v  Result Date: 12/12/2017 CLINICAL DATA:  Small-bowel obstruction. History of colon cancer in diverticulosis. EXAM: PORTABLE ABDOMEN - 1 VIEW COMPARISON:  Abdominal plain film dated 12/10/2017. FINDINGS: Enteric tube appears adequately positioned in the stomach. Bowel gas pattern is nonobstructive. Moderate amount of stool in the colon. No evidence of free intraperitoneal air. Lung bases appear clear. No acute or suspicious osseous finding. IMPRESSION: Nonobstructive bowel gas pattern. Enteric tube is adequately positioned in the stomach. Electronically Signed   By: Franki Cabot M.D.   On: 12/12/2017 09:54   Dg Abd Portable  1v  Result Date: 12/07/2017 CLINICAL DATA:  Abdominal distension, check nasogastric catheter EXAM: PORTABLE ABDOMEN - 1 VIEW COMPARISON:  12/06/2017 FINDINGS: Nasogastric catheter is  again noted in the distal stomach. Scattered large and small bowel gas is noted. Fecal material is noted throughout the colon. Changes consistent with the known right lateral abdominal wall hernia are again seen. The degree of small bowel dilatation has improved from the prior exam. No bony abnormality is noted. IMPRESSION: Improvement in small bowel dilatation when compare with the prior exam. Nasogastric catheter remains in the stomach. Electronically Signed   By: Inez Catalina M.D.   On: 12/07/2017 09:47   Dg Abd Portable 1 View  Result Date: 12/06/2017 CLINICAL DATA:  NG tube placement EXAM: PORTABLE ABDOMEN - 1 VIEW COMPARISON:  12/06/2017 CT FINDINGS: NG tube is in the distal stomach. Dilated right abdominal small bowel loops again noted. No free air. IMPRESSION: NG tube in the distal stomach. Electronically Signed   By: Rolm Baptise M.D.   On: 12/06/2017 11:44   Dg Addison Bailey G Tube Plc W/fl W/rad  Result Date: 12/08/2017 CLINICAL DATA:  Nasogastric tube placement EXAM: NASO G TUBE PLACEMENT WITH FL AND WITH RAD FLUOROSCOPY TIME:  Fluoroscopy Time:  12 seconds Radiation Exposure Index (if provided by the fluoroscopic device): 1.1 mGy Number of Acquired Spot Images: 0 COMPARISON:  None. FINDINGS: A 18 French nasogastric tube was inserted through the right nare and advanced into the stomach. Position was confirmed with fluoroscopy. No immediate complications. IMPRESSION: 1. Successful placement of a nasogastric tube with the tip in the fundus of the stomach. Electronically Signed   By: Kathreen Devoid   On: 12/08/2017 13:51    Assessment/Plan  1. HCAP (healthcare-associated pneumonia) -will start on doxycycline 100 mg p.o. twice daily x10 days, Risa-bid 250 mg 1 tablet twice a day,  and refer to hospice for comfort  care   2. Asthma with COPD (chronic obstructive pulmonary disease) (HCC) -continue Breo Ellipta 100-25 mcg/dose inhale 1 puff daily, DuoNeb nebulization 3 times a day    Family/ staff Communication: Discussed plan of care with resident, son and daughter-in-law who are at bedside.  Labs/tests ordered:  Chest x-ray  Goals of care:   Long-term care/Hospice care.   Durenda Age, NP [facility] [phone] (Monday-Friday 8:00 a.m. - 5:00 p.m.) [phone] (after hours)

## 2017-12-23 ENCOUNTER — Encounter: Payer: Self-pay | Admitting: Adult Health

## 2017-12-31 ENCOUNTER — Encounter
Admission: RE | Admit: 2017-12-31 | Discharge: 2017-12-31 | Disposition: A | Discharge status: Home / Self Care | Payer: Medicare Other | Source: Ambulatory Visit | Attending: Internal Medicine | Admitting: Internal Medicine

## 2018-01-10 ENCOUNTER — Emergency Department: Payer: Medicare Other

## 2018-01-10 ENCOUNTER — Inpatient Hospital Stay
Admission: EM | Admit: 2018-01-10 | Discharge: 2018-01-15 | DRG: 389 | Disposition: A | Discharge status: Skilled Nursing Facility | Payer: Medicare Other | Source: Skilled Nursing Facility | Attending: Surgery | Admitting: Surgery

## 2018-01-10 ENCOUNTER — Other Ambulatory Visit: Payer: Self-pay

## 2018-01-10 DIAGNOSIS — M8580 Other specified disorders of bone density and structure, unspecified site: Secondary | ICD-10-CM | POA: Diagnosis present

## 2018-01-10 DIAGNOSIS — J449 Chronic obstructive pulmonary disease, unspecified: Secondary | ICD-10-CM | POA: Diagnosis present

## 2018-01-10 DIAGNOSIS — F0390 Unspecified dementia without behavioral disturbance: Secondary | ICD-10-CM | POA: Diagnosis present

## 2018-01-10 DIAGNOSIS — Z9049 Acquired absence of other specified parts of digestive tract: Secondary | ICD-10-CM | POA: Diagnosis not present

## 2018-01-10 DIAGNOSIS — K56609 Unspecified intestinal obstruction, unspecified as to partial versus complete obstruction: Secondary | ICD-10-CM | POA: Diagnosis not present

## 2018-01-10 DIAGNOSIS — D72829 Elevated white blood cell count, unspecified: Secondary | ICD-10-CM | POA: Diagnosis present

## 2018-01-10 DIAGNOSIS — I517 Cardiomegaly: Secondary | ICD-10-CM | POA: Diagnosis present

## 2018-01-10 DIAGNOSIS — K565 Intestinal adhesions [bands], unspecified as to partial versus complete obstruction: Principal | ICD-10-CM | POA: Diagnosis present

## 2018-01-10 DIAGNOSIS — K8020 Calculus of gallbladder without cholecystitis without obstruction: Secondary | ICD-10-CM | POA: Diagnosis present

## 2018-01-10 DIAGNOSIS — Z801 Family history of malignant neoplasm of trachea, bronchus and lung: Secondary | ICD-10-CM | POA: Diagnosis not present

## 2018-01-10 DIAGNOSIS — K449 Diaphragmatic hernia without obstruction or gangrene: Secondary | ICD-10-CM | POA: Diagnosis present

## 2018-01-10 DIAGNOSIS — Z85820 Personal history of malignant melanoma of skin: Secondary | ICD-10-CM | POA: Diagnosis not present

## 2018-01-10 DIAGNOSIS — K5641 Fecal impaction: Secondary | ICD-10-CM | POA: Diagnosis present

## 2018-01-10 DIAGNOSIS — M40209 Unspecified kyphosis, site unspecified: Secondary | ICD-10-CM | POA: Diagnosis present

## 2018-01-10 DIAGNOSIS — Z85038 Personal history of other malignant neoplasm of large intestine: Secondary | ICD-10-CM

## 2018-01-10 DIAGNOSIS — Z803 Family history of malignant neoplasm of breast: Secondary | ICD-10-CM | POA: Diagnosis not present

## 2018-01-10 DIAGNOSIS — Z90710 Acquired absence of both cervix and uterus: Secondary | ICD-10-CM | POA: Diagnosis not present

## 2018-01-10 DIAGNOSIS — N183 Chronic kidney disease, stage 3 (moderate): Secondary | ICD-10-CM | POA: Diagnosis present

## 2018-01-10 DIAGNOSIS — Z66 Do not resuscitate: Secondary | ICD-10-CM | POA: Diagnosis present

## 2018-01-10 DIAGNOSIS — I700 Atherosclerosis of aorta: Secondary | ICD-10-CM | POA: Diagnosis present

## 2018-01-10 DIAGNOSIS — I447 Left bundle-branch block, unspecified: Secondary | ICD-10-CM | POA: Diagnosis present

## 2018-01-10 DIAGNOSIS — E039 Hypothyroidism, unspecified: Secondary | ICD-10-CM | POA: Diagnosis present

## 2018-01-10 DIAGNOSIS — Z8249 Family history of ischemic heart disease and other diseases of the circulatory system: Secondary | ICD-10-CM

## 2018-01-10 DIAGNOSIS — K5900 Constipation, unspecified: Secondary | ICD-10-CM | POA: Diagnosis not present

## 2018-01-10 DIAGNOSIS — I129 Hypertensive chronic kidney disease with stage 1 through stage 4 chronic kidney disease, or unspecified chronic kidney disease: Secondary | ICD-10-CM | POA: Diagnosis present

## 2018-01-10 DIAGNOSIS — J9811 Atelectasis: Secondary | ICD-10-CM | POA: Diagnosis present

## 2018-01-10 DIAGNOSIS — R112 Nausea with vomiting, unspecified: Secondary | ICD-10-CM | POA: Diagnosis present

## 2018-01-10 DIAGNOSIS — E785 Hyperlipidemia, unspecified: Secondary | ICD-10-CM | POA: Diagnosis present

## 2018-01-10 LAB — COMPREHENSIVE METABOLIC PANEL
ALBUMIN: 3.2 g/dL — AB (ref 3.5–5.0)
ALT: 12 U/L (ref 0–44)
ANION GAP: 10 (ref 5–15)
AST: 18 U/L (ref 15–41)
Alkaline Phosphatase: 76 U/L (ref 38–126)
BUN: 18 mg/dL (ref 8–23)
CALCIUM: 8.5 mg/dL — AB (ref 8.9–10.3)
CO2: 28 mmol/L (ref 22–32)
Chloride: 96 mmol/L — ABNORMAL LOW (ref 98–111)
Creatinine, Ser: 0.78 mg/dL (ref 0.44–1.00)
GFR calc non Af Amer: >60 mL/min (ref >60)
GLUCOSE: 120 mg/dL — AB (ref 70–99)
Potassium: 5 mmol/L (ref 3.5–5.1)
Sodium: 134 mmol/L — ABNORMAL LOW (ref 135–145)
TOTAL PROTEIN: 6.8 g/dL (ref 6.5–8.1)
Total Bilirubin: 0.5 mg/dL (ref 0.3–1.2)

## 2018-01-10 LAB — URINALYSIS, COMPLETE (UACMP) WITH MICROSCOPIC
Bacteria, UA: NONE SEEN
Bilirubin Urine: NEGATIVE
Glucose, UA: NEGATIVE mg/dL
Hgb urine dipstick: NEGATIVE
Ketones, ur: NEGATIVE mg/dL
Leukocytes, UA: NEGATIVE
Nitrite: NEGATIVE
PROTEIN: NEGATIVE mg/dL
Specific Gravity, Urine: 1.014 (ref 1.005–1.030)
Squamous Epithelial / LPF: NONE SEEN (ref 0–5)
pH: 7 (ref 5.0–8.0)

## 2018-01-10 LAB — CBC WITH DIFFERENTIAL/PLATELET
Abs Immature Granulocytes: 0.46 10*3/uL — ABNORMAL HIGH (ref 0.00–0.07)
Basophils Absolute: 0.1 10*3/uL (ref 0.0–0.1)
Basophils Relative: 1 %
EOS PCT: 0 %
Eosinophils Absolute: 0.1 10*3/uL (ref 0.0–0.5)
HEMATOCRIT: 34.4 % — AB (ref 36.0–46.0)
HEMOGLOBIN: 10.8 g/dL — AB (ref 12.0–15.0)
Immature Granulocytes: 3 %
LYMPHS PCT: 5 %
Lymphs Abs: 0.8 10*3/uL (ref 0.7–4.0)
MCH: 25.8 pg — AB (ref 26.0–34.0)
MCHC: 31.4 g/dL (ref 30.0–36.0)
MCV: 82.1 fL (ref 80.0–100.0)
MONO ABS: 0.5 10*3/uL (ref 0.1–1.0)
Monocytes Relative: 3 %
Neutro Abs: 15.5 10*3/uL — ABNORMAL HIGH (ref 1.7–7.7)
Neutrophils Relative %: 88 %
Platelets: 525 10*3/uL — ABNORMAL HIGH (ref 150–400)
RBC: 4.19 MIL/uL (ref 3.87–5.11)
RDW: 15.7 % — ABNORMAL HIGH (ref 11.5–15.5)
WBC: 17.4 10*3/uL — AB (ref 4.0–10.5)
nRBC: 0 % (ref 0.0–0.2)

## 2018-01-10 LAB — LIPASE, BLOOD: LIPASE: 29 U/L (ref 11–51)

## 2018-01-10 LAB — MRSA PCR SCREENING: MRSA by PCR: NEGATIVE

## 2018-01-10 LAB — TROPONIN I

## 2018-01-10 LAB — LACTIC ACID, PLASMA: Lactic Acid, Venous: 1.4 mmol/L (ref 0.5–1.9)

## 2018-01-10 MED ORDER — FLUTICASONE FUROATE-VILANTEROL 100-25 MCG/INH IN AEPB
1.0000 | INHALATION_SPRAY | Freq: Every day | RESPIRATORY_TRACT | Status: DC
  Administered 2018-01-10 – 2018-01-15 (×6): 1 via RESPIRATORY_TRACT
  Filled 2018-01-10: qty 28

## 2018-01-10 MED ORDER — BISACODYL 10 MG RE SUPP: 10.0000 mg | Freq: Two times a day (BID) | RECTAL | Status: DC | PRN

## 2018-01-10 MED ORDER — IPRATROPIUM-ALBUTEROL 0.5-2.5 (3) MG/3ML IN SOLN
3.0000 mL | Freq: Three times a day (TID) | RESPIRATORY_TRACT | Status: DC
  Administered 2018-01-10 – 2018-01-15 (×14): 3 mL via RESPIRATORY_TRACT
  Filled 2018-01-10 (×14): qty 3

## 2018-01-10 MED ORDER — LOSARTAN POTASSIUM 25 MG PO TABS: 25.0000 mg | ORAL_TABLET | Freq: Every day | ORAL | Status: DC | PRN

## 2018-01-10 MED ORDER — FAMOTIDINE IN NACL 20-0.9 MG/50ML-% IV SOLN
20.0000 mg | Freq: Two times a day (BID) | INTRAVENOUS | Status: DC
  Administered 2018-01-10 – 2018-01-12 (×4): 20 mg via INTRAVENOUS
  Filled 2018-01-10 (×5): qty 50

## 2018-01-10 MED ORDER — ENOXAPARIN SODIUM 40 MG/0.4ML ~~LOC~~ SOLN
40.0000 mg | SUBCUTANEOUS | Status: DC
  Administered 2018-01-10 – 2018-01-14 (×5): 40 mg via SUBCUTANEOUS
  Filled 2018-01-10 (×5): qty 0.4

## 2018-01-10 MED ORDER — ONDANSETRON HCL 4 MG/2ML IJ SOLN: 4.0000 mg | Freq: Four times a day (QID) | INTRAMUSCULAR | Status: DC | PRN

## 2018-01-10 MED ORDER — ACETAMINOPHEN 325 MG PO TABS: 650.0000 mg | ORAL_TABLET | ORAL | Status: DC | PRN

## 2018-01-10 MED ORDER — PANTOPRAZOLE SODIUM 40 MG PO TBEC
40.0000 mg | DELAYED_RELEASE_TABLET | Freq: Every day | ORAL | Status: DC
  Administered 2018-01-12 – 2018-01-15 (×4): 40 mg via ORAL
  Filled 2018-01-10 (×4): qty 1

## 2018-01-10 MED ORDER — ONDANSETRON 4 MG PO TBDP: 4.0000 mg | ORAL_TABLET | Freq: Four times a day (QID) | ORAL | Status: DC | PRN

## 2018-01-10 MED ORDER — SODIUM CHLORIDE 0.9 % IV SOLN
INTRAVENOUS | Status: DC
  Administered 2018-01-10 – 2018-01-15 (×6): via INTRAVENOUS

## 2018-01-10 MED ORDER — IOPAMIDOL (ISOVUE-300) INJECTION 61%
30.0000 mL | Freq: Once | INTRAVENOUS | Status: AC | PRN
  Administered 2018-01-10: 30 mL via ORAL

## 2018-01-10 MED ORDER — FLUTICASONE PROPIONATE 50 MCG/ACT NA SUSP
2.0000 | Freq: Every day | NASAL | Status: DC
  Administered 2018-01-11 – 2018-01-15 (×5): 2 via NASAL
  Filled 2018-01-10: qty 16

## 2018-01-10 MED ORDER — IOHEXOL 300 MG/ML  SOLN
75.0000 mL | Freq: Once | INTRAMUSCULAR | Status: AC | PRN
  Administered 2018-01-10: 75 mL via INTRAVENOUS

## 2018-01-10 MED ORDER — TRIMETHOPRIM 100 MG PO TABS
100.0000 mg | ORAL_TABLET | Freq: Every day | ORAL | Status: DC
  Administered 2018-01-12 – 2018-01-15 (×4): 100 mg via ORAL
  Filled 2018-01-10 (×4): qty 1

## 2018-01-10 NOTE — H&P (Signed)
SURGICAL HISTORY AND PHYSICAL NOTE   HISTORY OF PRESENT ILLNESS (HPI):  84 y.o. female presented to [facility] ED for evaluation of nausea and vomiting. Patient reports starting with nausea and vomiting 3 days ago. Most history taken by daughter, but most information was missing like last bowel movement, current medications for constipation, among other information. Patient denies associated abdominal pain. There is no pain radiation, no aggravator or improving factors. Patient cannot recall last meal and she refers she has no appetite. At the moment of this evaluation the patient is not nauseous. Cannot recall last bowel movement of if she is passing flatus.   Patient was recently admitted (3 weeks ago) due to same symptoms. She was treated with conservative management and improved.   I personally evaluated the images. The small bowel is not severely dilated. The hernia seen on CT scan is easily reducible. There is a large amount of stool through out the whole colon.   Surgery is consulted by Dr. Reita Cliche in this context for evaluation and management of small bowel obstruction.  PAST MEDICAL HISTORY (PMH):  Past Medical History:  Diagnosis Date  . Allergic rhinitis 11/18/2013  . Asthma with COPD (chronic obstructive pulmonary disease) ([place]) 11/18/2013   Last Assessment & Plan:  No flair ups noted recently and breathing is essentially at baseline.    . Asthma without status asthmaticus    unspecified  . Borderline hypothyroidism 11/18/2013   Last Assessment & Plan:  Energy and tsh are doing well  . Cancer [facility])    colon cancer- surgically removed in 2006  . Cholelithiases   . Chronic kidney disease   . Colon cancer ([place])   . Complete left bundle branch block (LBBB) 11/18/2013  . Constipation 11/18/2013  . Diverticulosis   . Hiatal hernia    right  . HLD (hyperlipidemia) 11/18/2013   Last Assessment & Plan:  Appropriate diet is followed and no myalgia's are noted.    . Hyperlipidemia, unspecified    . Hypertensive kidney disease with CKD stage III ([place]) 11/18/2013   Last Assessment & Plan:  Is compliant with hypertensive medications without clear side effects or lack of control.  Nausea and itching are not symptomatic and nsaids are being avoided.    . Melanoma ([place])   . Osteopenia      PAST SURGICAL HISTORY ([place]):  Past Surgical History:  Procedure Laterality Date  . ABDOMINAL HYSTERECTOMY    . Bladder Lift    . BREAST EXCISIONAL BIOPSY Left 15+ yrs ago   neg  . COLON RESECTION  2006   colon cancer  . COLON SURGERY    . COLON SURGERY Right   . COLONOSCOPY WITH PROPOFOL N/A 06/01/2017   Procedure: COLONOSCOPY WITH PROPOFOL;  Surgeon: [name], MD;  Location: [facility] ENDOSCOPY;  Service: Endoscopy;  Laterality: N/A;  . ESOPHAGOGASTRODUODENOSCOPY (EGD) WITH PROPOFOL N/A 06/01/2017   Procedure: ESOPHAGOGASTRODUODENOSCOPY (EGD) WITH PROPOFOL;  Surgeon: [name], MD;  Location: [facility] ENDOSCOPY;  Service: Endoscopy;  Laterality: N/A;  . HEMICOLECTOMY Right   . SKIN CANCER EXCISION  2006   melanoma     MEDICATIONS:  Prior to Admission medications   Medication Sig Start Date End Date Taking? Authorizing Provider  acetaminophen (TYLENOL) 325 MG tablet Take 650 mg by mouth every 4 (four) hours as needed for mild pain, moderate pain or fever.    Yes [provider]  bisacodyl (DULCOLAX) 10 MG suppository Place 10 mg rectally 2 (two) times daily as needed for mild constipation  or moderate constipation.    Yes [provider]  calcium carbonate (TUMS EX) 750 MG chewable tablet Chew 2 tablets by mouth daily.    Yes [provider]  Camphor-Menthol-Methyl Sal 06-09-28 % CREA Apply 1 application topically 3 (three) times daily as needed (for pain).    Yes [provider]  diclofenac sodium (VOLTAREN) 1 % GEL Apply 4 g topically 4 (four) times daily. To right knee   Yes [provider]  docusate sodium (COLACE) 100 MG capsule Take 1 capsule (100 mg  total) by mouth daily as needed. Patient taking differently: Take 100 mg by mouth daily as needed for mild constipation or moderate constipation.  04/14/17 04/14/18 Yes McShane, Gerda Diss, MD  fluticasone (FLONASE) 50 MCG/ACT nasal spray Place 2 sprays into both nostrils daily.  11/19/15  Yes [provider]  fluticasone furoate-vilanterol (BREO ELLIPTA) 100-25 MCG/INH AEPB Inhale 1 puff into the lungs daily.    Yes [provider]  ipratropium-albuterol (DUONEB) 0.5-2.5 (3) MG/3ML SOLN Take 3 mLs by nebulization 3 (three) times daily.    Yes [provider]  lactulose (CHRONULAC) 10 GM/15ML solution Take 30 g by mouth 2 (two) times daily as needed for mild constipation or moderate constipation.    Yes [provider]  losartan (COZAAR) 25 MG tablet Take 25 mg by mouth daily as needed (BP >150/90).    Yes [provider]  ondansetron (ZOFRAN) 4 MG tablet Take 4 mg by mouth every 6 (six) hours as needed for nausea or vomiting.   Yes [provider]  pantoprazole (PROTONIX) 40 MG tablet Take 40 mg by mouth daily before breakfast. before a meal to help with esophagus problems    Yes [provider]  polyethylene glycol (MIRALAX / GLYCOLAX) packet Take 17 g by mouth at bedtime.    Yes [provider]  Probiotic Product (RISA-BID PROBIOTIC) TABS Take 1 tablet by mouth 2 (two) times daily.   Yes [provider]  sodium phosphate (FLEET) 7-19 GM/118ML ENEM Place 1 enema rectally every three (3) days as needed for mild constipation or moderate constipation. Once every 3 days as needed    Yes [provider]  trimethoprim (TRIMPEX) 100 MG tablet Take 100 mg by mouth daily.    Yes [provider]  nystatin-triamcinolone (MYCOLOG II) cream Apply 1 application topically 2 (two) times daily. apply nystatin cream under the right breast to treat yeast infection/irritation    [provider]     ALLERGIES:  No  Known Allergies   SOCIAL HISTORY:  Social History   Socioeconomic History  . Marital status: Widowed    Spouse name: Not on file  . Number of children: Not on file  . Years of education: 72  . Highest education level: Bachelor's degree (e.g., BA, AB, BS)  Occupational History  . 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 Financial resource strain: Not on file  . Food insecurity:    Worry: Not on file    Inability: Not on file  . Transportation needs:    Medical: Not on file    Non-medical: Not on file  Tobacco Use  . Smoking status: Never Smoker  . Smokeless tobacco: Never Used  Substance and Sexual Activity  . Alcohol use: No  . Drug use: Never  . Sexual activity: Not on file  Lifestyle  . Physical activity:    Days per week: Not on file    Minutes per session: Not on  file  . Stress: Not on file  Relationships  . Social connections:    Talks on phone: Not on file    Gets together: Not on file    Attends religious service: Not on file    Active member of club or organization: Not on file    Attends meetings of clubs or organizations: Not on file    Relationship status: Not on file  . Intimate partner violence:    Fear of current or ex partner: Not on file    Emotionally abused: Not on file    Physically abused: Not on file    Forced sexual activity: Not on file  Other Topics Concern  . Not on file  Social History Narrative   From assisted living facility, however after recent discharge from the hospital-discharge to short-term rehab   -Most transfers all through wheelchair      Non smoker   No smokeless tobacco use   No alcohol use   Widowed   DNR with HCPOA and Living Will     The patient currently resides (home / rehab facility / nursing home): Nursing home.   FAMILY HISTORY:  Family History  Problem Relation Age of Onset  . Breast cancer Daughter 11       passed at 59 from breast cancer  . Heart disease Mother   . Heart failure Mother   . Lung cancer  Father      REVIEW OF SYSTEMS:  Constitutional: denies weight loss, fever, chills, or sweats  Eyes: denies any other vision changes, history of eye injury  ENT: denies sore throat, hearing problems  Respiratory: denies shortness of breath, wheezing  Cardiovascular: denies chest pain, palpitations  Gastrointestinal: denies abdominal pain, Positive for nausea and vomitng Genitourinary: denies burning with urination or urinary frequency Musculoskeletal: denies any other joint pains or cramps  Skin: denies any other rashes or skin discolorations  Neurological: denies any other headache, dizziness, weakness  Psychiatric: denies any other depression, anxiety   All other review of systems were negative   VITAL SIGNS:  Pulse Rate:  [75-84] 80 (11/11 1500) Resp:  [20-26] 21 (11/11 1500) BP: (135-168)/(82-96) 135/88 (11/11 1500) SpO2:  [90 %-97 %] 97 % (11/11 1500) Weight:  [61.6 kg] 61.6 kg (11/11 1002)     Height: 5' (152.4 cm) Weight: 61.6 kg BMI (Calculated): 26.52   INTAKE/OUTPUT:  This shift: No intake/output data recorded.  Last 2 shifts: @IOLAST2SHIFTS @   PHYSICAL EXAM:  Constitutional:  -- Normal body habitus  -- Awake, alert, and oriented x3 (person and place) Eyes:  -- Pupils equally round and reactive to light  -- No scleral icterus  Ear, nose, and throat:  -- No jugular venous distension  Pulmonary:  -- No crackles  -- Equal breath sounds bilaterally -- Breathing non-labored at rest Cardiovascular:  -- S1, S2 present  -- No pericardial rubs Gastrointestinal:  -- Abdomen soft, nontender, non-distended, no guarding or rebound tenderness, soft right lower quadrant hernia, reducible, bowel in hernia reducible, non tender.  Musculoskeletal and Integumentary:  -- Wounds or skin discoloration: None appreciated -- Extremities: B/L UE and LE FROM, hands and feet warm, no edema  Neurologic:  -- Motor function: intact and symmetric -- Sensation: intact and  symmetric   Labs:  CBC Latest Ref Rng & Units 01/10/2018 12/12/2017 12/08/2017  WBC 4.0 - 10.5 K/uL 17.4(H) 10.2 10.8(H)  Hemoglobin 12.0 - 15.0 g/dL 10.8(L) 10.9(L) 9.3(L)  Hematocrit 36.0 - 46.0 % 34.4(L) 34.9(L) 29.9(L)  Platelets 150 - 400 K/uL 525(H) 454(H) 429(H)   CMP Latest Ref Rng & Units 01/10/2018 12/16/2017 12/11/2017  Glucose 70 - 99 mg/dL 120(H) 88 128(H)  BUN 8 - 23 mg/dL 18 23 6(L)  Creatinine 0.44 - 1.00 mg/dL 0.78 0.94 1.04(H)  Sodium 135 - 145 mmol/L 134(L) 132(L) 137  Potassium 3.5 - 5.1 mmol/L 5.0 4.9 4.2  Chloride 98 - 111 mmol/L 96(L) 97(L) 101  CO2 22 - 32 mmol/L 28 28 27   Calcium 8.9 - 10.3 mg/dL 8.5(L) 9.0 8.8(L)  Total Protein 6.5 - 8.1 g/dL 6.8 6.3(L) -  Total Bilirubin 0.3 - 1.2 mg/dL 0.5 0.8 -  Alkaline Phos 38 - 126 U/L 76 66 -  AST 15 - 41 U/L 18 30 -  ALT 0 - 44 U/L 12 25 -    Imaging studies:  EXAM: CT ABDOMEN AND PELVIS WITH CONTRAST  TECHNIQUE: Multidetector CT imaging of the abdomen and pelvis was performed using the standard protocol following bolus administration of intravenous contrast.  CONTRAST:  26mL OMNIPAQUE IOHEXOL 300 MG/ML  SOLN  COMPARISON:  01/10/2018 plain film exam.  12/06/2017 CT.  FINDINGS: Lower chest: Basilar linear atelectasis. Cardiomegaly. Mitral valve calcification.  Hepatobiliary: Gallstones. No CT evidence of gallbladder inflammation no worrisome hepatic lesion.  Pancreas: No pancreatic mass or inflammation.  Spleen: No splenic mass or enlargement.  Adrenals/Urinary Tract: No obstructing stone or hydronephrosis. No worrisome renal or adrenal lesion. Noncontrast filled imaging urinary bladder without abnormality.  Stomach/Bowel: Prior right hemicolectomy. Right lateral wall hernia through which traverses small bowel, vessels and fat. The small bowel within the hernia appears dilated and inflamed with surrounding fluid.  Marked amount of stool rectosigmoid region and throughout the  colon. Prominent diverticula throughout the colon without extraluminal inflammation of diverticula.  Small to slightly moderate-size hiatal hernia.  Vascular/Lymphatic: Atherosclerotic changes aorta and aortic branch vessels. No abdominal aortic aneurysm or large vessel occlusion. The celiac artery 1.4 cm aneurysm.  Reproductive: Post hysterectomy.  No worrisome adnexal mass.  Other: No free air.  Musculoskeletal: Remote L1 compression fracture with 60% loss of height anteriorly and retropulsion posterosuperior aspect with kyphosis centered at this level and conus displaced posteriorly similar to prior exam. Degenerative changes L4-5 and L5-S1 with anterior slip unchanged. Hip joint degenerative changes.  IMPRESSION: 1. Prior right hemicolectomy. Right lateral wall hernia through which traverses small bowel, vessels and fat. The small bowel within the hernia appears dilated and inflamed with surrounding fluid. 2. Marked amount of stool rectosigmoid region. Prominent diverticula throughout the colon without extraluminal inflammation of diverticula. 3. Small to slightly moderate-size hiatal hernia. 4. Gallstones. 5. Aortic Atherosclerosis (ICD10-I70.0). 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 6. Remote L1 compression fracture with kyphosis centered at this level.   Electronically Signed   By: Genia Del M.D.   On: 01/10/2018 13:11  Assessment/Plan: 84 y.o.femalewith small bowel obstruction, likely attributable to post-surgical adhesions and or severe constipation, complicated byleukocytosis and chronic constipation/fecal impaction and bypertinent comorbidities includinghypertension,  Chronic kidney disease, cardiac arrhythmia (Left bundle branch block), COPD, hypothyroidism, among others. Patient has history of partial colectomy for colon cancer. She was recently admitted with similar clinical scenario, leukocytosis (mostl likely due to dehydration),  nausea and vomiting and constipation.   Comparing previous CT scan with the new CT scan, the small bowel dilation is less severe this time. At the moment of my evaluation, patient without nausea. Will start with IV hydration. NGT was discussed with family and with patient and patient  refers is not nausea. Will hold NGT at this moment but oriented that if she start vomiting again will need to put NGT. After hydration, will follow clinically. Will consider enema again last in last admission. Will keep NPO for today and treat with anti emetics.   Daughter and patient agrees that will like to avoid surgery if possible. I do not think that the hernia is the cause of her nausea and vomiting but most likely adhesions or impaction.    Arnold Long, MD

## 2018-01-10 NOTE — ED Provider Notes (Signed)
[facility] Emergency Department Provider Note ____________________________________________   I have reviewed the triage vital signs and the triage nursing note.  HISTORY  Chief Complaint Nausea and Emesis   Historian Patient and daughter  HPI Beth Chen is a 84 y.o. female from nursing home, history of recent small bowel obstruction within the past month, was treated nonsurgically with NG tube and had resolution.  When she returned to the nursing home she had a cough and was treated with doxycycline until couple of days ago for aspiration pneumonia.  Patient states that she started having no nausea but episodes of vomiting 3 days ago, nonbloody and nonbilious, that then resolved for a day and then came back yesterday and she had multiple episodes yesterday and overnight.  This is similar to symptoms with her prior small bowel obstruction presentation a couple weeks ago.  Denies chest pain.  Denies palpitations.  She still has a bit of a cough.  There is been no reported fevers.  She is completely incontinent, and is on preventative antibiotic for urinary tract infections which are frequent.  She is not having burning or frequency or lower abdominal pain.  No abdominal pain at present.  Questionable whether or not she has distended a bit.       Past Medical History:  Diagnosis Date  . Allergic rhinitis 11/18/2013  . Asthma with COPD (chronic obstructive pulmonary disease) ([place]) 11/18/2013   Last Assessment & Plan:  No flair ups noted recently and breathing is essentially at baseline.    . Asthma without status asthmaticus    unspecified  . Borderline hypothyroidism 11/18/2013   Last Assessment & Plan:  Energy and tsh are doing well  . Cancer [facility])    colon cancer- surgically removed in 2006  . Cholelithiases   . Chronic kidney disease   . Colon cancer ([place])   . Complete left bundle branch block (LBBB) 11/18/2013  . Constipation 11/18/2013  .  Diverticulosis   . Hiatal hernia    right  . HLD (hyperlipidemia) 11/18/2013   Last Assessment & Plan:  Appropriate diet is followed and no myalgia's are noted.    . Hyperlipidemia, unspecified   . Hypertensive kidney disease with CKD stage III ([place]) 11/18/2013   Last Assessment & Plan:  Is compliant with hypertensive medications without clear side effects or lack of control.  Nausea and itching are not symptomatic and nsaids are being avoided.    . Melanoma ([place])   . Osteopenia     Patient Active Problem List   Diagnosis Date Noted  . Small bowel obstruction ([place]) 01/10/2018  . Intestinal adhesions with complete obstruction ([place]) 12/06/2017  . Essential hypertension, benign 11/20/2017  . Frequent UTI 11/20/2017  . Primary osteoarthritis of right knee 11/20/2017  . Protein-calorie malnutrition ([place]) 08/30/2017  . Reflux esophagitis   . Anemia 05/30/2017  . Generalized weakness 05/20/2017  . Lower GI bleed 09/07/2016  . GI bleed 09/05/2016  . Allergic rhinitis 11/18/2013  . Asthma with COPD (chronic obstructive pulmonary disease) ([place]) 11/18/2013  . Borderline hypothyroidism 11/18/2013  . Complete left bundle branch block (LBBB) 11/18/2013  . Constipation 11/18/2013  . Hypertensive kidney disease with CKD stage III ([place]) 11/18/2013    Past Surgical History:  Procedure Laterality Date  . ABDOMINAL HYSTERECTOMY    . Bladder Lift    . BREAST EXCISIONAL BIOPSY Left 15+ yrs ago   neg  . COLON RESECTION  2006   colon cancer  . COLON SURGERY    .  COLON SURGERY Right   . COLONOSCOPY WITH PROPOFOL N/A 06/01/2017   Procedure: COLONOSCOPY WITH PROPOFOL;  Surgeon: [name], MD;  Location: [facility] ENDOSCOPY;  Service: Endoscopy;  Laterality: N/A;  . ESOPHAGOGASTRODUODENOSCOPY (EGD) WITH PROPOFOL N/A 06/01/2017   Procedure: ESOPHAGOGASTRODUODENOSCOPY (EGD) WITH PROPOFOL;  Surgeon: [name], MD;  Location: [facility] ENDOSCOPY;  Service: Endoscopy;  Laterality: N/A;  . HEMICOLECTOMY Right    . SKIN CANCER EXCISION  2006   melanoma    Prior to Admission medications   Medication Sig Start Date End Date Taking? Authorizing Provider  acetaminophen (TYLENOL) 325 MG tablet Take 650 mg by mouth every 4 (four) hours as needed for mild pain, moderate pain or fever.    Yes [provider]  bisacodyl (DULCOLAX) 10 MG suppository Place 10 mg rectally 2 (two) times daily as needed for mild constipation or moderate constipation.    Yes [provider]  calcium carbonate (TUMS EX) 750 MG chewable tablet Chew 2 tablets by mouth daily.    Yes [provider]  Camphor-Menthol-Methyl Sal 06-09-28 % CREA Apply 1 application topically 3 (three) times daily as needed (for pain).    Yes [provider]  diclofenac sodium (VOLTAREN) 1 % GEL Apply 4 g topically 4 (four) times daily. To right knee   Yes [provider]  docusate sodium (COLACE) 100 MG capsule Take 1 capsule (100 mg total) by mouth daily as needed. Patient taking differently: Take 100 mg by mouth daily as needed for mild constipation or moderate constipation.  04/14/17 04/14/18 Yes McShane, Gerda Diss, MD  fluticasone (FLONASE) 50 MCG/ACT nasal spray Place 2 sprays into both nostrils daily.  11/19/15  Yes [provider]  fluticasone furoate-vilanterol (BREO ELLIPTA) 100-25 MCG/INH AEPB Inhale 1 puff into the lungs daily.    Yes [provider]  ipratropium-albuterol (DUONEB) 0.5-2.5 (3) MG/3ML SOLN Take 3 mLs by nebulization 3 (three) times daily.    Yes [provider]  lactulose (CHRONULAC) 10 GM/15ML solution Take 30 g by mouth 2 (two) times daily as needed for mild constipation or moderate constipation.    Yes [provider]  losartan (COZAAR) 25 MG tablet Take 25 mg by mouth daily as needed (BP >150/90).    Yes [provider]  ondansetron (ZOFRAN) 4 MG tablet Take 4 mg by mouth every 6 (six) hours as needed for nausea or vomiting.   Yes [provider]  pantoprazole (PROTONIX) 40 MG tablet Take 40 mg by mouth daily before breakfast. before a meal to help with esophagus problems    Yes [provider]  polyethylene glycol (MIRALAX / GLYCOLAX) packet Take 17 g by mouth at bedtime.    Yes [provider]  Probiotic Product (RISA-BID PROBIOTIC) TABS Take 1 tablet by mouth 2 (two) times daily.   Yes [provider]  sodium phosphate (FLEET) 7-19 GM/118ML ENEM Place 1 enema rectally every three (3) days as needed for mild constipation or moderate constipation. Once every 3 days as needed    Yes [provider]  trimethoprim (TRIMPEX) 100 MG tablet Take 100 mg by mouth daily.    Yes [provider]  nystatin-triamcinolone (MYCOLOG II) cream Apply 1 application topically 2 (two) times daily. apply nystatin cream under the right breast to treat yeast infection/irritation    [provider]    No Known Allergies  Family History  Problem Relation Age of Onset  . Breast cancer Daughter 53  passed at 53 from breast cancer  . Heart disease Mother   . Heart failure Mother   . Lung cancer Father     Social History Social History   Tobacco Use  . Smoking status: Never Smoker  . Smokeless tobacco: Never Used  Substance Use Topics  . Alcohol use: No  . Drug use: Never    Review of Systems  Constitutional: Negative for fever. Eyes: Negative for visual changes. ENT: Negative for sore throat. Cardiovascular: Negative for chest pain. Respiratory: Negative for shortness of breath. Gastrointestinal: Negative for diarrhea.  Reports not passing gas in the last 24 hours. Genitourinary: Negative for dysuria. Musculoskeletal: Negative for back pain. Skin: Negative for rash. Neurological: Negative for headache.  ____________________________________________   PHYSICAL EXAM:  VITAL SIGNS: ED Triage Vitals  Enc Vitals Group     BP 01/10/18 1001 (!) 168/96     Pulse Rate  01/10/18 1001 78     Resp --      Temp --      Temp src --      SpO2 01/10/18 1001 95 %     Weight 01/10/18 1002 135 lb 12.9 oz (61.6 kg)     Height 01/10/18 1002 5' (1.524 m)     Head Circumference --      Peak Flow --      Pain Score 01/10/18 1002 0     Pain Loc --      Pain Edu? --      Excl. in [place]? --      Constitutional: Alert and oriented.  HEENT      Head: Normocephalic and atraumatic.      Eyes: Conjunctivae are normal. Pupils equal and round.       Ears:         Nose: No congestion/rhinnorhea.      Mouth/Throat: Mucous membranes are mildly dry.      Neck: No stridor. Cardiovascular/Chest: Normal rate, regular rhythm.  No murmurs, rubs, or gallops. Respiratory: Normal respiratory effort without tachypnea nor retractions. Breath sounds are clear and equal bilaterally.  Wheezing.  Moderate rhonchi at bases bilaterally and with cough. Gastrointestinal: Abdomen a little tight, but overall soft and nontender.  Decreased bowel sounds. Genitourinary/rectal:Deferred Musculoskeletal: Nontender with normal range of motion in all extremities. No joint effusions.  No lower extremity tenderness.  No edema. Neurologic:  Normal speech and language. No gross or focal neurologic deficits are appreciated. Skin:  Skin is warm, dry and intact. No rash noted. Psychiatric: Mood and affect are normal. Speech and behavior are normal. Patient exhibits appropriate insight and judgment.   ____________________________________________  LABS (pertinent positives/negatives) I, Lisa Roca, MD the attending physician have reviewed the labs noted below.  Labs Reviewed  COMPREHENSIVE METABOLIC PANEL - Abnormal; Notable for the following components:      Result Value   Sodium 134 (*)    Chloride 96 (*)    Glucose, Bld 120 (*)    Calcium 8.5 (*)    Albumin 3.2 (*)    All other components within normal limits  CBC WITH DIFFERENTIAL/PLATELET - Abnormal; Notable for the following components:   WBC  17.4 (*)    Hemoglobin 10.8 (*)    HCT 34.4 (*)    MCH 25.8 (*)    RDW 15.7 (*)    Platelets 525 (*)    Neutro Abs 15.5 (*)    Abs Immature Granulocytes 0.46 (*)    All other components within normal limits  URINALYSIS,  COMPLETE (UACMP) WITH MICROSCOPIC - Abnormal; Notable for the following components:   Color, Urine YELLOW (*)    APPearance CLEAR (*)    All other components within normal limits  TROPONIN I  LACTIC ACID, PLASMA  LIPASE, BLOOD  LACTIC ACID, PLASMA    ____________________________________________    EKG I, Lisa Roca, MD, the attending physician have personally viewed and interpreted all ECGs.  72 bpm.  normal sinus rhythm.  Left bundle branch block.  Comparison with prior EKG, old left bundle branch block. ____________________________________________  RADIOLOGY   Chest and abdomen x-ray reviewed by myself, radiologist interpretation:  IMPRESSION: 1. Large right lateral abdominal wall hernia contains gas distended small bowel loops. There are adjacent gas distended small bowel loops within the medial aspect of the right abdomen. Similar findings noted on prior CT. Partial obstruction cannot be excluded. 2. Moderate stool throughout the colon most notable rectosigmoid region. 3. No free intraperitoneal air. 4. Small to moderate-size hiatal hernia. 5. Cardiomegaly. 6. Central pulmonary vascular prominence. 7. Aortic Atherosclerosis (ICD10-I70.0).  CT abdomen pelvis with contrast:  IMPRESSION: 1. Prior right hemicolectomy. Right lateral wall hernia through which traverses small bowel, vessels and fat. The small bowel within the hernia appears dilated and inflamed with surrounding fluid. 2. Marked amount of stool rectosigmoid region. Prominent diverticula throughout the colon without extraluminal inflammation of diverticula. 3. Small to slightly moderate-size hiatal hernia. 4. Gallstones. 5. Aortic Atherosclerosis (ICD10-I70.0). 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 6. Remote L1 compression fracture with kyphosis centered at this level. __________________________________________  PROCEDURES  Procedure(s) performed: None  Procedures  Critical Care performed: None   ____________________________________________  ED COURSE / ASSESSMENT AND PLAN  Pertinent labs & imaging results that were available during my care of the patient were reviewed by me and considered in my medical decision making (see chart for details).    I reviewed patient's prior hospitalization in October with CT scan consistent with small bowel obstruction.  I reviewed discharge summary, patient managed medically until resolution.  Patient is overall well-appearing today with symptoms concerning for likely return small bowel obstruction.   X-ray concerning for dilated bowel loops with small bowel obstruction, plan to send for CT.  Urinalysis not consistent with urine tract infection.  Discussed elevated white blood cell count with the patient and family.  Discussed CT results showing small bowel within the hernia dilated and inflamed with surrounding fluid.  We defer decision regarding any value of antibiotic for intra-abdominal source to Dr. Peyton Najjar, general surgeon for consult/admission for small bowel obstruction.   CONSULTATIONS:   General surgery for admission, Dr. Peyton Najjar.   Patient / Family / Caregiver informed of clinical course, medical decision-making process, and agree with plan.    ___________________________________________   FINAL CLINICAL IMPRESSION(S) / ED DIAGNOSES   Final diagnoses:  Small bowel obstruction ([place])      ___________________________________________         Note: This dictation was prepared with Dragon dictation. Any transcriptional errors that result from this process are unintentional    Lisa Roca, MD 01/10/18 1651

## 2018-01-10 NOTE — ED Notes (Signed)
Family at bedside. Admitting MD left BS. Pt updated regarding plan of care.

## 2018-01-10 NOTE — ED Triage Notes (Signed)
Patient had 2 episodes of brown emesis last night. Hx of SBO

## 2018-01-11 DIAGNOSIS — K56609 Unspecified intestinal obstruction, unspecified as to partial versus complete obstruction: Secondary | ICD-10-CM

## 2018-01-11 LAB — COMPREHENSIVE METABOLIC PANEL
ALK PHOS: 65 U/L (ref 38–126)
ALT: 11 U/L (ref 0–44)
ANION GAP: 8 (ref 5–15)
AST: 15 U/L (ref 15–41)
Albumin: 3 g/dL — ABNORMAL LOW (ref 3.5–5.0)
BILIRUBIN TOTAL: 0.7 mg/dL (ref 0.3–1.2)
BUN: 16 mg/dL (ref 8–23)
CO2: 25 mmol/L (ref 22–32)
Calcium: 8.3 mg/dL — ABNORMAL LOW (ref 8.9–10.3)
Chloride: 102 mmol/L (ref 98–111)
Creatinine, Ser: 0.79 mg/dL (ref 0.44–1.00)
GFR calc non Af Amer: >60 mL/min (ref >60)
GLUCOSE: 98 mg/dL (ref 70–99)
Potassium: 3.9 mmol/L (ref 3.5–5.1)
Sodium: 135 mmol/L (ref 135–145)
TOTAL PROTEIN: 5.8 g/dL — AB (ref 6.5–8.1)

## 2018-01-11 LAB — CBC
HCT: 30.9 % — ABNORMAL LOW (ref 36.0–46.0)
Hemoglobin: 9.7 g/dL — ABNORMAL LOW (ref 12.0–15.0)
MCH: 25.7 pg — AB (ref 26.0–34.0)
MCHC: 31.4 g/dL (ref 30.0–36.0)
MCV: 81.7 fL (ref 80.0–100.0)
Platelets: 464 10*3/uL — ABNORMAL HIGH (ref 150–400)
RBC: 3.78 MIL/uL — AB (ref 3.87–5.11)
RDW: 15.7 % — ABNORMAL HIGH (ref 11.5–15.5)
WBC: 10 10*3/uL (ref 4.0–10.5)
nRBC: 0 % (ref 0.0–0.2)

## 2018-01-11 MED ORDER — MAGNESIUM HYDROXIDE 400 MG/5ML PO SUSP
15.0000 mL | Freq: Every day | ORAL | Status: DC
  Administered 2018-01-11: 15 mL via ORAL
  Filled 2018-01-11 (×2): qty 30

## 2018-01-11 MED ORDER — SORBITOL 70 % SOLN
960.0000 mL | TOPICAL_OIL | Freq: Once | ORAL | Status: AC
  Administered 2018-01-11: 960 mL via RECTAL
  Filled 2018-01-11: qty 473

## 2018-01-11 MED ORDER — BOOST / RESOURCE BREEZE PO LIQD CUSTOM
1.0000 | Freq: Three times a day (TID) | ORAL | Status: DC
  Administered 2018-01-11 – 2018-01-12 (×2): 1 via ORAL

## 2018-01-11 MED ORDER — SORBITOL 70 % SOLN
960.0000 mL | TOPICAL_OIL | Freq: Every day | ORAL | Status: DC
  Administered 2018-01-12 – 2018-01-14 (×2): 960 mL via RECTAL
  Filled 2018-01-11 (×7): qty 473

## 2018-01-11 NOTE — Progress Notes (Addendum)
SURGICAL PROGRESS NOTE (cpt: [phone])  Patient seen and examined as described below with surgical PA-C, Ardell Isaacs.  Assessment/Plan: (ICD-10's: K59.00) In summary, patient is a 84 y.o. female with severe acute on chronic constipation, complicated by resolvedleukocytosis and comorbidities includingadvanced chronological age, HTN, HLD, CKD (stage 3), chroniccardiac arrhythmia (Left bundle branch block), COPD, hypothyroidism, chronic wide-defect easily reduced RLQ incisional hernia, hiatal hernia with GERD, and osteopenia.   - okay with clear liquids  - bowel regimen starting with Colace BID, SMOG enemas, and milk of magnesia  - medical management of comorbidities (home medications, medical consult if any unresolved issues)  - monitor abdominal exam and ongoing bowel function  - DVT prophylaxis, ambulation encouraged  I have personally reviewed the patient's chart, evaluated/examined the patient, proposed the recommended management, and discussed these recommendations with the patient and her daughter to their expressed satisfaction as well as with patient's RN.  -- Marilynne Drivers. Rosana Hoes, MD, [place]: [facility] - Partnering for exceptional care. Office: [phone]      SURGICAL PROGRESS NOTE (cpt [phone])  Hospital Day(s): 1.   Post op day(s):  Marland Kitchen   Interval History: Patient seen and examined, no acute events or new complaints overnight. Patient reports that she has not had any abdominal pain, nausea, or emesis since admission. She is unsure of her last bowel movement but vocalizes that they are usually very small, hard, and infrequent.   Patient is otherwise a poor historian, and daughter at bedside helps contribute to history however, she is unable to report on what happens at the nursing home in regards to her bowel regimen, bowel habits, and PO intake.   Review of Systems:  Constitutional: denies fever, chills  HEENT: denies cough or  congestion  Respiratory: denies any shortness of breath  Cardiovascular: denies chest pain or palpitations  Gastrointestinal: denies abdominal pain, N/V, + Constipation Genitourinary: denies burning with urination or urinary frequency Musculoskeletal: denies pain, decreased motor or sensation Integumentary: denies any other rashes or skin discolorations Neurological: denies HA or vision/hearing changes   Vital signs in last 24 hours: [min-max] current  Temp:  [98.1 F (36.7 C)-98.3 F (36.8 C)] 98.1 F (36.7 C) (11/12 0414) Pulse Rate:  [68-126] 70 (11/12 0414) Resp:  [17-27] 20 (11/12 0414) BP: (116-168)/(74-102) 163/74 (11/12 0414) SpO2:  [90 %-98 %] 95 % (11/12 0804) Weight:  [70.2 kg] 70.2 kg (11/11 1842)     Height: 5' (152.4 cm) Weight: 70.2 kg BMI (Calculated): 30.23   Intake/Output this shift:  No intake/output data recorded.   Intake/Output last 2 shifts:  @IOLAST2SHIFTS @   Physical Exam:  Constitutional: alert, cooperative and no distress  HENT: normocephalic without obvious abnormality  Eyes: EOM's grossly intact and symmetric  Respiratory: breathing non-labored at rest  Gastrointestinal: soft, non-tender, and non-distended. Large right lateral incisional hernia, easily reducible Musculoskeletal: no edema or wounds, motor and sensation grossly intact, NT   Labs:  CBC Latest Ref Rng & Units 01/11/2018 01/10/2018 12/12/2017  WBC 4.0 - 10.5 K/uL 10.0 17.4(H) 10.2  Hemoglobin 12.0 - 15.0 g/dL 9.7(L) 10.8(L) 10.9(L)  Hematocrit 36.0 - 46.0 % 30.9(L) 34.4(L) 34.9(L)  Platelets 150 - 400 K/uL 464(H) 525(H) 454(H)   CMP Latest Ref Rng & Units 01/11/2018 01/10/2018 12/16/2017  Glucose 70 - 99 mg/dL 98 120(H) 88  BUN 8 - 23 mg/dL 16 18 23   Creatinine 0.44 - 1.00 mg/dL 0.79 0.78 0.94  Sodium 135 - 145 mmol/L 135 134(L) 132(L)  Potassium 3.5 -  5.1 mmol/L 3.9 5.0 4.9  Chloride 98 - 111 mmol/L 102 96(L) 97(L)  CO2 22 - 32 mmol/L 25 28 28   Calcium 8.9 - 10.3 mg/dL 8.3(L)  8.5(L) 9.0  Total Protein 6.5 - 8.1 g/dL 5.8(L) 6.8 6.3(L)  Total Bilirubin 0.3 - 1.2 mg/dL 0.7 0.5 0.8  Alkaline Phos 38 - 126 U/L 65 76 66  AST 15 - 41 U/L 15 18 30   ALT 0 - 44 U/L 11 12 25      Imaging studies:   -- CT Abdomen/Pelvis on 11/11:   IMPRESSION: 1. Prior right hemicolectomy. Right lateral wall hernia through which traverses small bowel, vessels and fat. The small bowel within the hernia appears dilated and inflamed with surrounding fluid. 2. Marked amount of stool rectosigmoid region. Prominent diverticula throughout the colon without extraluminal inflammation of diverticula. 3. Small to slightly moderate-size hiatal hernia. 4. Gallstones. 5. Aortic Atherosclerosis (ICD10-I70.0). 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 6. Remote L1 compression fracture with kyphosis centered at this level.    Assessment/Plan: (ICD-10's: K9.00) 84 y.o.femalewith significant constipation on imaging rather than suggested small bowel obstructionlikely attributable to post-surgical adhesions followingremote Right colectomyin [place] for colon cancer, and not likelyattributable to chronic wide-defect easily reducible (and easily reduced) RLQ incisional hernia, complicated byresolvedleukocytosis,and comorbidities includingadvanced chronological age, HTN, HLD, CKD (stage 3), chroniccardiac arrhythmia (Left bundle branch block), COPD, hypothyroidism, hiatal hernia with GERD, and osteopenia.  - Okay for clear liquids + Boost Breeze supplementation, IV fluids  - Monitor abdominal exam and on-going bowel fucntion             - Smog enema today + Milk of magnesia and reassess tomorrow  - Appreciate Dr. Windell Moment help with admission. Given the patient was under the care of our service (Dr. Rosana Hoes) last admission, we will assume care at this time as patient is known to [place]             - Medical management comorbidities              - Ambulation encouraged              - DVT  prophylaxis  All of the above findings and recommendations were discussed with the patient, patient's family, and the medical team, and all of patient's and family's questions were answered to their expressed satisfaction.  -- Edison Simon, PA-C [facility] 01/11/2018, 10:17 AM [phone] M-F: 7am - 4pm

## 2018-01-12 MED ORDER — MAGNESIUM CITRATE PO SOLN
1.0000 | Freq: Once | ORAL | Status: DC
  Filled 2018-01-12: qty 296

## 2018-01-12 MED ORDER — GUAIFENESIN-DM 100-10 MG/5ML PO SYRP: 5.0000 mL | ORAL_SOLUTION | ORAL | Status: DC | PRN

## 2018-01-12 MED ORDER — ENSURE ENLIVE PO LIQD
237.0000 mL | Freq: Two times a day (BID) | ORAL | Status: DC
  Administered 2018-01-13 – 2018-01-14 (×2): 237 mL via ORAL

## 2018-01-12 NOTE — Progress Notes (Addendum)
SURGICAL PROGRESS NOTE (cpt: [phone])  Patient seen and examined as described below with surgical PA-C, Ardell Isaacs.  Assessment/Plan: (ICD-10's: K59.00) In summary, patient is a 84 y.o. female with severe acute on chronic constipation, complicated by resolvedleukocytosis and comorbidities includingadvanced chronological age, advanced chronic dementia, HTN, HLD, CKD (stage 3), chroniccardiac arrhythmia (Left bundle branch block), COPD, hypothyroidism, chronic wide-defect easily reduced RLQ incisional hernia, hiatal hernia with GERD, and osteopenia.              - advance to full liquids + nutritional supplements             - continue bowel regimen including Colace BID + SMOG enemas and magnesium citrate today  - anticipate more aggressive bowel regimen/prep tomorrow and discharge planning for possibly Friday             - medical management of comorbidities (home medications, medical consult if any unresolved issues)             - monitor abdominal exam and ongoing bowel function  - frequent repositioning to minimize pressure             - DVT prophylaxis, ambulation encouraged  I have personally reviewed the patient's chart, evaluated/examined the patient, proposed the recommended management, and discussed these recommendations with the patient and her daughter to their expressed satisfaction as well as with patient's RN.  -- Marilynne Drivers. Rosana Hoes, MD, [place]: [facility] - Partnering for exceptional care. Office: [phone]      SURGICAL PROGRESS NOTE (cpt [phone])  Hospital Day(s): 2.   Post op day(s):  Marland Kitchen   Interval History: Patient seen and examined, no acute events or new complaints overnight. Patient reports no abdominal pain, nausea, or emesis. She did have significant bowel movement after enema yesterday  History is otherwise difficult to obtain given history of dementia  Review of Systems:   Gastrointestinal: denies abdominal pain, N/V, or diarrhea/and bowel function as per interval history  ROS otherwise limited given history of dementia and lack of patient's daughter being in room   Vital signs in last 24 hours: [min-max] current  Temp:  [98.2 F (36.8 C)-98.5 F (36.9 C)] 98.5 F (36.9 C) (11/13 0450) Pulse Rate:  [67-76] 67 (11/13 0450) Resp:  [16-20] 20 (11/13 0450) BP: (125-141)/(84-92) 141/84 (11/13 0450) SpO2:  [95 %-100 %] 100 % (11/13 0450)     Height: 5' (152.4 cm) Weight: 70.2 kg BMI (Calculated): 30.23   Intake/Output this shift:  No intake/output data recorded.   Intake/Output last 2 shifts:  @IOLAST2SHIFTS @   Physical Exam:  Constitutional: alert, cooperative and no distress  HENT: normocephalic without obvious abnormality  Eyes: EOM's grossly intact and symmetric  Respiratory: breathing non-labored at rest  Gastrointestinal: soft, non-tender, and non-distended, Large right lateral incisional hernia, easily reducible Musculoskeletal: no edema or wounds, motor and sensation grossly intact, NT   Labs:  CBC Latest Ref Rng & Units 01/11/2018 01/10/2018 12/12/2017  WBC 4.0 - 10.5 K/uL 10.0 17.4(H) 10.2  Hemoglobin 12.0 - 15.0 g/dL 9.7(L) 10.8(L) 10.9(L)  Hematocrit 36.0 - 46.0 % 30.9(L) 34.4(L) 34.9(L)  Platelets 150 - 400 K/uL 464(H) 525(H) 454(H)   CMP Latest Ref Rng & Units 01/11/2018 01/10/2018 12/16/2017  Glucose 70 - 99 mg/dL 98 120(H) 88  BUN 8 - 23 mg/dL 16 18 23   Creatinine 0.44 - 1.00 mg/dL 0.79 0.78 0.94  Sodium 135 - 145 mmol/L 135 134(L) 132(L)  Potassium 3.5 - 5.1 mmol/L 3.9  5.0 4.9  Chloride 98 - 111 mmol/L 102 96(L) 97(L)  CO2 22 - 32 mmol/L 25 28 28   Calcium 8.9 - 10.3 mg/dL 8.3(L) 8.5(L) 9.0  Total Protein 6.5 - 8.1 g/dL 5.8(L) 6.8 6.3(L)  Total Bilirubin 0.3 - 1.2 mg/dL 0.7 0.5 0.8  Alkaline Phos 38 - 126 U/L 65 76 66  AST 15 - 41 U/L 15 18 30   ALT 0 - 44 U/L 11 12 25    Imaging studies: No new pertinent imaging  studies  Assessment/Plan: (ICD-10's: K59.00) In summary, patient is a 84 y.o. female with severe acute on chronic constipation, complicated by resolvedleukocytosis and comorbidities includingadvanced chronological age, HTN, HLD, CKD (stage 3), chroniccardiac arrhythmia (Left bundle branch block), COPD, hypothyroidism, chronic wide-defect easily reduced RLQ incisional hernia, hiatal hernia with GERD, and osteopenia.   - Continue clear liquids for now, IVF             - Continue bowel regimen with Colace BID, daily SMOG enemas, and milk of magnesia             - medical management of comorbidities (home medications, medical consult if any unresolved issues)             - monitor abdominal exam and ongoing bowel function             - DVT prophylaxis, ambulation encouraged  All of the above findings and recommendations were discussed with the patient, patient's family, and the medical team, and all of patient's and family's questions were answered to their expressed satisfaction.  -- Edison Simon, PA-C [facility] 01/12/2018, 7:49 AM [phone] M-F: 7am - 4pm

## 2018-01-12 NOTE — NC FL2 (Signed)
[place] LEVEL OF CARE SCREENING TOOL     IDENTIFICATION  Patient Name: Beth Chen Birthdate: 16-Nov-1921 Sex: female Admission Date (Current Location): 01/10/2018  [place] and [place] Number:  Engineering geologist and Address:  [facility], [address] [place],  [address]      Provider Number: [id number]  Attending Physician Name and Address:  Vickie Epley, MD  Relative Name and Phone Number:       Current Level of Care: Hospital Recommended Level of Care: [facility] Prior Approval Number:    Date Approved/Denied:   PASRR Number:    Discharge Plan: SNF    Current Diagnoses: Patient Active Problem List   Diagnosis Date Noted  . Small bowel obstruction ([place]) 01/10/2018  . Intestinal adhesions with complete obstruction ([place]) 12/06/2017  . Essential hypertension, benign 11/20/2017  . Frequent UTI 11/20/2017  . Primary osteoarthritis of right knee 11/20/2017  . Protein-calorie malnutrition ([place]) 08/30/2017  . Reflux esophagitis   . Anemia 05/30/2017  . Generalized weakness 05/20/2017  . Lower GI bleed 09/07/2016  . GI bleed 09/05/2016  . Allergic rhinitis 11/18/2013  . Asthma with COPD (chronic obstructive pulmonary disease) ([place]) 11/18/2013  . Borderline hypothyroidism 11/18/2013  . Complete left bundle branch block (LBBB) 11/18/2013  . Constipation 11/18/2013  . Hypertensive kidney disease with CKD stage III ([place]) 11/18/2013    Orientation RESPIRATION BLADDER Height & Weight     Self, Situation, Place  Normal Incontinent Weight: 154 lb 12.2 oz (70.2 kg) Height:  5' (152.4 cm)  BEHAVIORAL SYMPTOMS/MOOD NEUROLOGICAL BOWEL NUTRITION STATUS  (none) (none) Incontinent    AMBULATORY STATUS COMMUNICATION OF NEEDS Skin   Extensive Assist Verbally Normal                       Personal Care Assistance Level of Assistance  Dressing, Feeding, Bathing Bathing Assistance: Maximum  assistance Feeding assistance: Maximum assistance Dressing Assistance: Maximum assistance     Functional Limitations Info  Hearing   Hearing Info: Impaired      SPECIAL CARE FACTORS FREQUENCY                       Contractures Contractures Info: Not present    Additional Factors Info  Code Status Code Status Info: full             Current Medications (01/12/2018):  This is the current hospital active medication list Current Facility-Administered Medications  Medication Dose Route Frequency Provider Last Rate Last Dose  . 0.9 %  sodium chloride infusion   Intravenous Continuous Herbert Pun, MD 50 mL/hr at 01/12/18 1033    . acetaminophen (TYLENOL) tablet 650 mg  650 mg Oral Q4H PRN Herbert Pun, MD      . bisacodyl (DULCOLAX) suppository 10 mg  10 mg Rectal BID PRN Herbert Pun, MD      . enoxaparin (LOVENOX) injection 40 mg  40 mg Subcutaneous Q24H Herbert Pun, MD   40 mg at 01/11/18 2247  . famotidine (PEPCID) IVPB 20 mg premix  20 mg Intravenous Q12H Herbert Pun, MD 100 mL/hr at 01/12/18 1046 20 mg at 01/12/18 1046  . feeding supplement (BOOST / RESOURCE BREEZE) liquid 1 Container  1 Container Oral TID BM Tylene Fantasia, PA-C   1 Container at 01/12/18 1050  . fluticasone (FLONASE) 50 MCG/ACT nasal spray 2 spray  2 spray Each Nare Daily Herbert Pun, MD   2  spray at 01/12/18 1039  . fluticasone furoate-vilanterol (BREO ELLIPTA) 100-25 MCG/INH 1 puff  1 puff Inhalation Daily Herbert Pun, MD   1 puff at 01/12/18 1041  . ipratropium-albuterol (DUONEB) 0.5-2.5 (3) MG/3ML nebulizer solution 3 mL  3 mL Nebulization TID Herbert Pun, MD   3 mL at 01/12/18 1402  . losartan (COZAAR) tablet 25 mg  25 mg Oral Daily PRN Herbert Pun, MD      . magnesium hydroxide (MILK OF MAGNESIA) suspension 15 mL  15 mL Oral Daily Edison Simon R, PA-C   15 mL at 01/11/18 1349  . ondansetron (ZOFRAN-ODT)  disintegrating tablet 4 mg  4 mg Oral Q6H PRN Herbert Pun, MD       Or  . ondansetron (ZOFRAN) injection 4 mg  4 mg Intravenous Q6H PRN Herbert Pun, MD      . pantoprazole (PROTONIX) EC tablet 40 mg  40 mg Oral QAC breakfast Herbert Pun, MD   40 mg at 01/12/18 0834  . sorbitol, milk of mag, mineral oil, glycerin (SMOG) enema  960 mL Rectal Daily Tylene Fantasia, PA-C   960 mL at 01/12/18 1315  . trimethoprim (TRIMPEX) tablet 100 mg  100 mg Oral Daily Herbert Pun, MD   100 mg at 01/12/18 1040     Discharge Medications: Please see discharge summary for a list of discharge medications.  Relevant Imaging Results:  Relevant Lab Results:   Additional Information    Shela Leff, LCSW

## 2018-01-12 NOTE — Clinical Social Work Note (Signed)
Clinical Social Work Assessment  Patient Details  Name: Beth Chen MRN: [id number] Date of Birth: 09/17/1921  Date of referral:  01/12/18               Reason for consult:  Discharge Planning                Permission sought to share information with:  Family Supports, Customer service manager Permission granted to share information::  Yes, Verbal Permission Granted  Name::        Agency::     Relationship::     Contact Information:     Housing/Transportation Living arrangements for the past 2 months:  [place] of Information:  Patient Patient Interpreter Needed:  None Criminal Activity/Legal Involvement Pertinent to Current Situation/Hospitalization:  No - Comment as needed Significant Relationships:  Adult Children Lives with:  Facility Resident Do you feel safe going back to the place where you live?  Yes Need for family participation in patient care:  Yes (Comment)  Care giving concerns:  Patient is a [facility] resident at [facility].    Social Worker assessment / plan:  CSW informed by Lovena Le at [facility] that patient can return at discharge. CSW spoke with patient this afternoon. Patient confirmed that she lives in [place] at [facility]. She states she has been there for a while and would like to return when time. Patient stated her daughter should be returning to the room soon if I needed to speak with her.   Employment status:    Insurance information:    PT Recommendations:    Information / Referral to community resources:     Patient/Family's Response to care:  Patient expressed appreciation for CSW assistance.  Patient/Family's Understanding of and Emotional Response to Diagnosis, Current Treatment, and Prognosis:  Patient was pleasantly sitting up in bed and was engaged in conversation with CSW.  Emotional Assessment Appearance:  Appears younger than stated age Attitude/Demeanor/Rapport:  (pleasant and cooperative) Affect (typically  observed):  Calm, Happy Orientation:  Oriented to Self, Oriented to Place, Oriented to Situation Alcohol / Substance use:  Not Applicable Psych involvement (Current and /or in the community):  No (Comment)  Discharge Needs  Concerns to be addressed:  Care Coordination Readmission within the last 30 days:  No Current discharge risk:  None Barriers to Discharge:  No Barriers Identified   Shela Leff, LCSW 01/12/2018, 2:24 PM

## 2018-01-13 MED ORDER — PEG 3350-KCL-NA BICARB-NACL 420 G PO SOLR
4000.0000 mL | Freq: Once | ORAL | Status: DC
  Filled 2018-01-13: qty 4000

## 2018-01-13 NOTE — Care Management Important Message (Signed)
Copy of signed IM left with patient in room.  

## 2018-01-13 NOTE — Progress Notes (Addendum)
SURGICAL PROGRESS NOTE(cpt: [phone])  Patient seen and examined as described below with surgical PA-C, Ardell Isaacs.  Assessment/Plan: (ICD-10's:K59.00) In summary, patient is a30 y.o.femalewith severe acute on chronic constipation, complicated byresolvedleukocytosisandcomorbidities includingadvanced chronological age, advanced chronic dementia, HTN, HLD, CKD (stage 3), chroniccardiac arrhythmia (Left bundle branch block), COPD, hypothyroidism, chronic wide-defect easily reduced RLQ incisional hernia,hiatal hernia with GERD, and osteopenia.  - continue full liquids + nutritional supplements - continue bowel regimen including Colace BID + SMOG enemas and GoLytely today - medical management of comorbidities (home medications, medical consult if any unresolved issues) - monitor abdominal exam and ongoing bowel function             - frequent repositioning to minimize pressure - DVT prophylaxis, ambulation encouraged  - discharge planning  I have personally reviewed the patient's chart, evaluated/examined the patient, proposed the recommended management, and discussed these recommendations with the patient and herdaughterto their expressed satisfaction as well as with patient's RN.  -- Marilynne Drivers. Rosana Hoes, MD, [place]: [facility] - Partnering for exceptional care. Office: [phone]      SURGICAL PROGRESS NOTE (cpt [phone])  Hospital Day(s): 3.   Post op day(s):  Marland Kitchen   Interval History: Patient seen and examined, no acute events or new complaints overnight. Patient reports no abdominal pain, nausea, or emesis.   History limited. Discussed with nursing staff, no BM after enema yesterday.   Review of Systems:  Gastrointestinal: denies abdominal pain, N/V, or diarrhea/and bowel function as per interval history  ROS limited given history of  dementia   Vital signs in last 24 hours: [min-max] current  Temp:  [97.6 F (36.4 C)-98.1 F (36.7 C)] 98 F (36.7 C) (11/14 0800) Pulse Rate:  [60-71] 60 (11/14 0800) Resp:  [16-20] 16 (11/14 0800) BP: (108-155)/(62-77) 155/75 (11/14 0800) SpO2:  [95 %-100 %] 100 % (11/14 0800)     Height: 5' (152.4 cm) Weight: 70.2 kg BMI (Calculated): 30.23   Intake/Output this shift:  No intake/output data recorded.   Intake/Output last 2 shifts:  @IOLAST2SHIFTS @   Physical Exam:  Constitutional: alert, cooperative and no distress  HENT: normocephalic without obvious abnormality  Eyes: EOM's grossly intact and symmetric  Respiratory: breathing non-labored at rest  Gastrointestinal: soft, non-tender, and non-distended Musculoskeletal: UE and LE FROM, no edema or wounds, motor and sensation grossly intact, NT    Labs:  CBC Latest Ref Rng & Units 01/11/2018 01/10/2018 12/12/2017  WBC 4.0 - 10.5 K/uL 10.0 17.4(H) 10.2  Hemoglobin 12.0 - 15.0 g/dL 9.7(L) 10.8(L) 10.9(L)  Hematocrit 36.0 - 46.0 % 30.9(L) 34.4(L) 34.9(L)  Platelets 150 - 400 K/uL 464(H) 525(H) 454(H)   CMP Latest Ref Rng & Units 01/11/2018 01/10/2018 12/16/2017  Glucose 70 - 99 mg/dL 98 120(H) 88  BUN 8 - 23 mg/dL 16 18 23   Creatinine 0.44 - 1.00 mg/dL 0.79 0.78 0.94  Sodium 135 - 145 mmol/L 135 134(L) 132(L)  Potassium 3.5 - 5.1 mmol/L 3.9 5.0 4.9  Chloride 98 - 111 mmol/L 102 96(L) 97(L)  CO2 22 - 32 mmol/L 25 28 28   Calcium 8.9 - 10.3 mg/dL 8.3(L) 8.5(L) 9.0  Total Protein 6.5 - 8.1 g/dL 5.8(L) 6.8 6.3(L)  Total Bilirubin 0.3 - 1.2 mg/dL 0.7 0.5 0.8  Alkaline Phos 38 - 126 U/L 65 76 66  AST 15 - 41 U/L 15 18 30   ALT 0 - 44 U/L 11 12 25     Imaging studies: No new pertinent imaging studies  Assessment/Plan: (ICD-10's:K59.00) In summary, patient is a32 y.o.femalewith severe acute on chronic constipation, complicated byresolvedleukocytosisandcomorbidities includingadvanced chronological age, advanced  chronic dementia, HTN, HLD, CKD (stage 3), chroniccardiac arrhythmia (Left bundle branch block), COPD, hypothyroidism, chronic wide-defect easily reduced RLQ incisional hernia,hiatal hernia with GERD, and osteopenia.   - full liquids + nutritional supplements  - Will give more aggressive bowel prep today   - medical management of comorbidities (home medications, medical consult if any unresolved issues) - monitor abdominal exam and ongoing bowel function             - frequent repositioning to minimize pressure - DVT prophylaxis, ambulation encouraged   All of the above findings and recommendations were discussed with the patient, and the medical team, and all of patient's questions were answered to her expressed satisfaction.   -- Edison Simon, PA-C [facility] 01/13/2018, 8:29 AM [phone] M-F: 7am - 4pm

## 2018-01-14 DIAGNOSIS — K5900 Constipation, unspecified: Secondary | ICD-10-CM

## 2018-01-14 NOTE — Progress Notes (Signed)
Patient's daughter refused golytely over the night. Patient too sleepy to take orals.

## 2018-01-14 NOTE — Progress Notes (Addendum)
SURGICAL PROGRESS NOTE(cpt: [phone])  Patient seen and examined as described below with surgical PA-C, Ardell Isaacs.  Assessment/Plan: (ICD-10's:K59.00) In summary, patient is a78 y.o.femalewith severe acute on chronic constipation, complicated byresolvedleukocytosisandcomorbidities includingadvanced chronological age,advanced chronic dementia,HTN, HLD, CKD (stage 3), chroniccardiac arrhythmia (Left bundle branch block), COPD, hypothyroidism, chronic wide-defect easily reduced RLQ incisional hernia,hiatal hernia with GERD, and osteopenia.  -soft diet + nutritional supplements -continuebowel regimenincludingColace BID +SMOG enemas and GoLytely today - medical management of comorbidities (home medications, medical consult if any unresolved issues) - monitor abdominal exam and ongoing bowel function - frequent repositioning to minimize pressure - DVT prophylaxis, ambulation encouraged             - discharge planning  I have personally reviewed the patient's chart, evaluated/examined the patient, proposed the recommended management, and discussed these recommendations with the patient and herdaughterto their expressed satisfaction as well as with patient's RN.  -- Marilynne Drivers. Rosana Hoes, MD, [place]: [facility] - Partnering for exceptional care. Office: [phone]      SURGICAL PROGRESS NOTE (cpt [phone])  Hospital Day(s): 4.   Post op day(s):  Marland Kitchen   Interval History: Patient seen and examined, no acute events or new complaints overnight. Patient denies abdominal pain or nausea. She did note one episode of emesis last night which her daughter confirms. Did not take the GoLytely yesterday.  Daughter expressed concerns over paranoia last night and this morning, however, patient seemed more her baseline on examination.   HPI  limited given history of dementia   Review of Systems:  Gastrointestinal: denies abdominal pain, N/V, or diarrhea/and bowel function as per interval history  ROS limited given history of dementia  Vital signs in last 24 hours: [min-max] current  Temp:  [97.8 F (36.6 C)-98.2 F (36.8 C)] 97.8 F (36.6 C) (11/15 0428) Pulse Rate:  [67-80] 75 (11/15 0428) Resp:  [18-20] 20 (11/15 0428) BP: (119-168)/(82-92) 150/92 (11/15 0428) SpO2:  [95 %-100 %] 97 % (11/15 0822)     Height: 5' (152.4 cm) Weight: 70.2 kg BMI (Calculated): 30.23   Intake/Output this shift:  Total I/O In: 360 [P.O.:360] Out: 600 [Urine:600]   Intake/Output last 2 shifts:  @IOLAST2SHIFTS @   Physical Exam:  Constitutional: alert, cooperative and no distress  HENT: normocephalic without obvious abnormality  Eyes: EOM's grossly intact and symmetric  Respiratory: breathing non-labored at rest  Gastrointestinal: soft, non-tender, and non-distended Musculoskeletal: UE and LE FROM, no edema or wounds, motor and sensation grossly intact, NT   Labs:  CBC Latest Ref Rng & Units 01/11/2018 01/10/2018 12/12/2017  WBC 4.0 - 10.5 K/uL 10.0 17.4(H) 10.2  Hemoglobin 12.0 - 15.0 g/dL 9.7(L) 10.8(L) 10.9(L)  Hematocrit 36.0 - 46.0 % 30.9(L) 34.4(L) 34.9(L)  Platelets 150 - 400 K/uL 464(H) 525(H) 454(H)   CMP Latest Ref Rng & Units 01/11/2018 01/10/2018 12/16/2017  Glucose 70 - 99 mg/dL 98 120(H) 88  BUN 8 - 23 mg/dL 16 18 23   Creatinine 0.44 - 1.00 mg/dL 0.79 0.78 0.94  Sodium 135 - 145 mmol/L 135 134(L) 132(L)  Potassium 3.5 - 5.1 mmol/L 3.9 5.0 4.9  Chloride 98 - 111 mmol/L 102 96(L) 97(L)  CO2 22 - 32 mmol/L 25 28 28   Calcium 8.9 - 10.3 mg/dL 8.3(L) 8.5(L) 9.0  Total Protein 6.5 - 8.1 g/dL 5.8(L) 6.8 6.3(L)  Total Bilirubin 0.3 - 1.2 mg/dL 0.7 0.5 0.8  Alkaline Phos 38 - 126 U/L 65 76 66  AST 15 - 41 U/L 15  18 30  ALT 0 - 44 U/L 11 12 25    Imaging studies: No new pertinent imaging studies  Assessment/Plan:  (ICD-10's:K59.00) In summary, patient is a89 y.o.femalewith severe acute on chronic constipation, complicated byresolvedleukocytosisandcomorbidities includingadvanced chronological age,advanced chronic dementia,HTN, HLD, CKD (stage 3), chroniccardiac arrhythmia (Left bundle branch block), COPD, hypothyroidism, chronic wide-defect easily reduced RLQ incisional hernia,hiatal hernia with GERD, and osteopenia.   - advance to soft diet + nutritional supplements -continuebowel regimenincludingColace BID +SMOG enemas and GoLytely today as she did not drink this yesterday - medical management of comorbidities (home medications, medical consult if any unresolved issues) - monitor abdominal exam and ongoing bowel function - frequent repositioning to minimize pressure - DVT prophylaxis, ambulation encouraged             - discharge planning  All of the above findings and recommendations were discussed with the patient, patient's family, and the medical team, and all of patient's and family's questions were answered to their expressed satisfaction.  Thank you for the opportunity to participate in this patient's care.  -- Edison Simon, PA-C [facility] 01/14/2018, 10:38 AM [phone] M-F: 7am - 4pm

## 2018-01-15 MED ORDER — DOCUSATE SODIUM 100 MG PO CAPS: 100.0000 mg | ORAL_CAPSULE | Freq: Two times a day (BID) | ORAL | 2 refills | Status: AC

## 2018-01-15 MED ORDER — DOCUSATE SODIUM 100 MG PO CAPS: 100.0000 mg | ORAL_CAPSULE | Freq: Two times a day (BID) | ORAL | 0 refills | Status: DC

## 2018-01-15 MED ORDER — PSYLLIUM 0.52 G PO CAPS: 0.5200 g | ORAL_CAPSULE | Freq: Every day | ORAL | 2 refills | Status: DC

## 2018-01-15 MED ORDER — IPRATROPIUM-ALBUTEROL 0.5-2.5 (3) MG/3ML IN SOLN: 3.0000 mL | Freq: Four times a day (QID) | RESPIRATORY_TRACT | Status: DC | PRN

## 2018-01-15 NOTE — Clinical Social Work Note (Signed)
The patient will discharge to [facility] today via non-emergent EMS. The patient, her daughter, and the facility are aware and in agreement. The CSW has delivered the discharge packet, and all necessary documentation is on the [place]. The CSW is signing off. Please consult should needs arise.  Santiago Bumpers, MSW, [facility]er [phone]

## 2018-01-15 NOTE — Discharge Instructions (Addendum)
In addition to included general instructions for Severe Constipation,  Diet: Resume home heart healthy High-Fiber diet.           Drink plenty of water (dehydration increases constipation).  Activity: Light activity and walking are encouraged whenever possible.  Medications: Resume all home medications. Also, take Colace Stool Softener Twice Daily (must confirm patient swallows and keeps down medication) and Metamucil Daily. Take Miralax once every day x1 week with an Enema once every day x1 week. After 1 week, Continue Colace Stool Softener Twice Daily (always confirm patient swallows and keeps down medication) and Metamucil Daily, but Miralax or Lactulose and enemas can be as needed for mild - moderate constipation following first week after discharge.

## 2018-01-15 NOTE — Discharge Summary (Signed)
Physician Discharge Summary  Patient ID: Beth Chen MRN: [id number] DOB/AGE: 08-Aug-1921 84 y.o.  Admit date: 01/10/2018 Discharge date: 01/15/2018  Admission Diagnoses:  Discharge Diagnoses:  Active Problems:   Small bowel obstruction [facility])  Discharged Condition: fair  Hospital Course: 84 y.o. female presented to [facility] ED for nausea and vomiting. Workup was found to be significant for CT imaging demonstrating severe constipation. Progressively aggressive bowel regimen was initiated with appreciated encouragement and support from patient's family, and patient's constipation and bowel function improved, while advancement of patient's diet was well-tolerated. The remainder of patient's hospital course was essentially unremarkable, and discharge planning was initiated accordingly with patient safely able to be discharged home with appropriate discharge instructions and outpatient bowel regimen after all of her and family's questions were answered to their expressed satisfaction.  Consults: None  Significant Diagnostic Studies: radiology: CT scan: Severe constipation  Treatments: IV hydration and bowel regimen (Colace, SMOG enemas, and milk of magnesia/magnesium citrate/GoLytely)  Discharge Exam: Blood pressure (!) 166/80, pulse 63, temperature (!) 97.5 F (36.4 C), temperature source Oral, resp. rate 20, height 5' (1.524 m), weight 70.2 kg, SpO2 100 %. General appearance: alert, cooperative, no distress and chronic dementia GI: soft, non-tender; bowel sounds normal; no masses,  no organomegaly  Disposition: Discharge disposition: 03-Skilled Nursing Facility       Allergies as of 01/15/2018   No Known Allergies     Medication List    TAKE these medications   acetaminophen 325 MG tablet Commonly known as:  TYLENOL Take 650 mg by mouth every 4 (four) hours as needed for mild pain, moderate pain or fever.   bisacodyl 10 MG suppository Commonly known as:  DULCOLAX Place 10 mg  rectally 2 (two) times daily as needed for mild constipation or moderate constipation.   calcium carbonate 750 MG chewable tablet Commonly known as:  TUMS EX Chew 2 tablets by mouth daily.   Camphor-Menthol-Methyl Sal 06-09-28 % Crea Apply 1 application topically 3 (three) times daily as needed (for pain).   diclofenac sodium 1 % Gel Commonly known as:  VOLTAREN Apply 4 g topically 4 (four) times daily. To right knee   docusate sodium 100 MG capsule Commonly known as:  COLACE Take 1 capsule (100 mg total) by mouth 2 (two) times daily. What changed:    when to take this  reasons to take this   fluticasone 50 MCG/ACT nasal spray Commonly known as:  FLONASE Place 2 sprays into both nostrils daily.   fluticasone furoate-vilanterol 100-25 MCG/INH Aepb Commonly known as:  BREO ELLIPTA Inhale 1 puff into the lungs daily.   ipratropium-albuterol 0.5-2.5 (3) MG/3ML Soln Commonly known as:  DUONEB Take 3 mLs by nebulization 3 (three) times daily.   lactulose 10 GM/15ML solution Commonly known as:  CHRONULAC Take 30 g by mouth 2 (two) times daily as needed for mild constipation or moderate constipation.   losartan 25 MG tablet Commonly known as:  COZAAR Take 25 mg by mouth daily as needed (BP >150/90).   nystatin-triamcinolone cream Commonly known as:  MYCOLOG II Apply 1 application topically 2 (two) times daily. apply nystatin cream under the right breast to treat yeast infection/irritation   ondansetron 4 MG tablet Commonly known as:  ZOFRAN Take 4 mg by mouth every 6 (six) hours as needed for nausea or vomiting.   pantoprazole 40 MG tablet Commonly known as:  PROTONIX Take 40 mg by mouth daily before breakfast. before a meal to help with esophagus problems  polyethylene glycol packet Commonly known as:  MIRALAX / GLYCOLAX Take 17 g by mouth at bedtime.   psyllium 0.52 g capsule Commonly known as:  REGULOID Take 1 capsule (0.52 g total) by mouth daily.   RISA-BID  PROBIOTIC Tabs Take 1 tablet by mouth 2 (two) times daily.   sodium phosphate 7-19 GM/118ML Enem Place 1 enema rectally every three (3) days as needed for mild constipation or moderate constipation. Once every 3 days as needed   trimethoprim 100 MG tablet Commonly known as:  TRIMPEX Take 100 mg by mouth daily.      Contact information for after-discharge care    Destination    [facility] .   Service:  Skilled Nursing Contact information: [place] [phone]              Signed: Vickie Epley 01/15/2018, 1:01 PM

## 2018-01-17 ENCOUNTER — Encounter: Payer: Self-pay | Admitting: Adult Health

## 2018-01-17 ENCOUNTER — Non-Acute Institutional Stay (SKILLED_NURSING_FACILITY): Payer: Medicare Other | Admitting: Adult Health

## 2018-01-17 DIAGNOSIS — J449 Chronic obstructive pulmonary disease, unspecified: Secondary | ICD-10-CM | POA: Diagnosis not present

## 2018-01-17 DIAGNOSIS — J4489 Other specified chronic obstructive pulmonary disease: Secondary | ICD-10-CM

## 2018-01-17 DIAGNOSIS — K2100 Gastro-esophageal reflux disease with esophagitis, without bleeding: Secondary | ICD-10-CM

## 2018-01-17 DIAGNOSIS — I129 Hypertensive chronic kidney disease with stage 1 through stage 4 chronic kidney disease, or unspecified chronic kidney disease: Secondary | ICD-10-CM

## 2018-01-17 DIAGNOSIS — M1711 Unilateral primary osteoarthritis, right knee: Secondary | ICD-10-CM

## 2018-01-17 DIAGNOSIS — I10 Essential (primary) hypertension: Secondary | ICD-10-CM | POA: Diagnosis not present

## 2018-01-17 DIAGNOSIS — E46 Unspecified protein-calorie malnutrition: Secondary | ICD-10-CM

## 2018-01-17 DIAGNOSIS — D638 Anemia in other chronic diseases classified elsewhere: Secondary | ICD-10-CM

## 2018-01-17 DIAGNOSIS — K56609 Unspecified intestinal obstruction, unspecified as to partial versus complete obstruction: Secondary | ICD-10-CM

## 2018-01-17 DIAGNOSIS — N183 Chronic kidney disease, stage 3 unspecified: Secondary | ICD-10-CM

## 2018-01-17 DIAGNOSIS — N390 Urinary tract infection, site not specified: Secondary | ICD-10-CM

## 2018-01-17 NOTE — Progress Notes (Signed)
Location:   [facility] Room Number: [place] of Service:  SNF (31)   CODE STATUS: DNR  No Known Allergies  Chief Complaint  Patient presents with  . Hospitalization Follow-up    Hospital follow up    HPI:  She is a 84 year old long term resident of this facility who has been hospitalized from 01-10-18 through 01-15-18 for a small bowel obstruction. She was treated with aggressive bowel regimen. Her diet was advanced as tolerated. She is presently denying any nausea or vomiting; no reports of diarrhea or constipation. There are no reports of fevers present. She will continue to be followed for her chronic illnesses including: hypertension; asthma with copd; reflux esophagitis.   Past Medical History:  Diagnosis Date  . Allergic rhinitis 11/18/2013  . Asthma with COPD (chronic obstructive pulmonary disease) ([place]) 11/18/2013   Last Assessment & Plan:  No flair ups noted recently and breathing is essentially at baseline.    . Asthma without status asthmaticus    unspecified  . Borderline hypothyroidism 11/18/2013   Last Assessment & Plan:  Energy and tsh are doing well  . Cancer [facility])    colon cancer- surgically removed in 2006  . Cholelithiases   . Chronic kidney disease   . Colon cancer ([place])   . Complete left bundle branch block (LBBB) 11/18/2013  . Constipation 11/18/2013  . Diverticulosis   . Hiatal hernia    right  . HLD (hyperlipidemia) 11/18/2013   Last Assessment & Plan:  Appropriate diet is followed and no myalgia's are noted.    . Hyperlipidemia, unspecified   . Hypertensive kidney disease with CKD stage III ([place]) 11/18/2013   Last Assessment & Plan:  Is compliant with hypertensive medications without clear side effects or lack of control.  Nausea and itching are not symptomatic and nsaids are being avoided.    . Melanoma ([place])   . Osteopenia     Past Surgical History:  Procedure Laterality Date  . ABDOMINAL HYSTERECTOMY    . Bladder  Lift    . BREAST EXCISIONAL BIOPSY Left 15+ yrs ago   neg  . COLON RESECTION  2006   colon cancer  . COLON SURGERY    . COLON SURGERY Right   . COLONOSCOPY WITH PROPOFOL N/A 06/01/2017   Procedure: COLONOSCOPY WITH PROPOFOL;  Surgeon: [name], MD;  Location: [facility] ENDOSCOPY;  Service: Endoscopy;  Laterality: N/A;  . ESOPHAGOGASTRODUODENOSCOPY (EGD) WITH PROPOFOL N/A 06/01/2017   Procedure: ESOPHAGOGASTRODUODENOSCOPY (EGD) WITH PROPOFOL;  Surgeon: [name], MD;  Location: [facility] ENDOSCOPY;  Service: Endoscopy;  Laterality: N/A;  . HEMICOLECTOMY Right   . SKIN CANCER EXCISION  2006   melanoma    Social History   Socioeconomic History  . Marital status: Widowed    Spouse name: Not on file  . Number of children: Not on file  . Years of education: 74  . Highest education level: Bachelor's degree (e.g., BA, AB, BS)  Occupational History  . 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 Financial resource strain: Not on file  . Food insecurity:    Worry: Not on file    Inability: Not on file  . Transportation needs:    Medical: Not on file    Non-medical: Not on file  Tobacco Use  . Smoking status: Never Smoker  . Smokeless tobacco: Never Used  Substance and Sexual Activity  . Alcohol use: No  . Drug use: Never  . Sexual activity: Not  on file  Lifestyle  . Physical activity:    Days per week: Not on file    Minutes per session: Not on file  . Stress: Not on file  Relationships  . Social connections:    Talks on phone: Not on file    Gets together: Not on file    Attends religious service: Not on file    Active member of club or organization: Not on file    Attends meetings of clubs or organizations: Not on file    Relationship status: Not on file  . Intimate partner violence:    Fear of current or ex partner: Not on file    Emotionally abused: Not on file    Physically abused: Not on file    Forced sexual activity: Not on file  Other Topics Concern  . Not on file  Social  History Narrative   From assisted living facility, however after recent discharge from the hospital-discharge to short-term rehab   -Most transfers all through wheelchair      Non smoker   No smokeless tobacco use   No alcohol use   Widowed   DNR with HCPOA and Living Will    Family History  Problem Relation Age of Onset  . Breast cancer Daughter 48       passed at 73 from breast cancer  . Heart disease Mother   . Heart failure Mother   . Lung cancer Father       VITAL SIGNS BP 124/83   Pulse 70   Temp 98.3 F (36.8 C)   Resp 20   Ht 5' (1.524 m)   Wt 160 lb (72.6 kg)   SpO2 92%   BMI 31.25 kg/m   Outpatient Encounter Medications as of 01/17/2018  Medication Sig  . acetaminophen (TYLENOL) 325 MG tablet Take 650 mg by mouth every 4 (four) hours as needed for mild pain, moderate pain or fever.   . bisacodyl (DULCOLAX) 10 MG suppository Place 10 mg rectally 2 (two) times daily as needed for mild constipation or moderate constipation.   . calcium carbonate (TUMS EX) 750 MG chewable tablet Chew 2 tablets by mouth daily.   . diclofenac sodium (VOLTAREN) 1 % GEL Apply 4 g topically 4 (four) times daily. To right knee  . docusate sodium (COLACE) 100 MG capsule Take 1 capsule (100 mg total) by mouth 2 (two) times daily.  . fluticasone (FLONASE) 50 MCG/ACT nasal spray Place 2 sprays into both nostrils daily.   . fluticasone furoate-vilanterol (BREO ELLIPTA) 100-25 MCG/INH AEPB Inhale 1 puff into the lungs daily.   Marland Kitchen ipratropium-albuterol (DUONEB) 0.5-2.5 (3) MG/3ML SOLN Take 3 mLs by nebulization 3 (three) times daily.   Marland Kitchen lactulose (CHRONULAC) 10 GM/15ML solution Take 30 g by mouth 2 (two) times daily as needed for mild constipation or moderate constipation.   Marland Kitchen losartan (COZAAR) 25 MG tablet Take 25 mg by mouth daily as needed (BP >150/90).   . nystatin-triamcinolone (MYCOLOG II) cream Apply 1 application topically 2 (two) times daily. apply nystatin cream under the right breast  to treat yeast infection/irritation  . ondansetron (ZOFRAN) 4 MG tablet Take 4 mg by mouth every 6 (six) hours as needed for nausea or vomiting.  . pantoprazole (PROTONIX) 40 MG tablet Take 40 mg by mouth daily before breakfast. before a meal to help with esophagus problems   . polyethylene glycol (MIRALAX / GLYCOLAX) packet Take 17 g by mouth at bedtime.   . Probiotic Product (  RISA-BID PROBIOTIC) TABS Take 1 tablet by mouth 2 (two) times daily.  . psyllium (METAMUCIL) 0.52 g capsule Take 1 capsule (0.52 g total) by mouth daily.  . sodium phosphate (FLEET) 7-19 GM/118ML ENEM Place 1 enema rectally every three (3) days as needed for mild constipation or moderate constipation. Once every 3 days as needed   . trimethoprim (TRIMPEX) 100 MG tablet Take 100 mg by mouth daily.   . [DISCONTINUED] Camphor-Menthol-Methyl Sal 06-09-28 % CREA Apply 1 application topically 3 (three) times daily as needed (for pain).    No facility-administered encounter medications on file as of 01/17/2018.      SIGNIFICANT DIAGNOSTIC EXAMS  PREVIOUS;  11-24-17: KUB: no specific bowel gas pattern with no definite acute intra-abdominal pathology.   TODAY:   12-06-17: ct of abdomen and pelvis Large right lateral hernia is noted in right lower quadrant of the abdomen which contains several loops of dilated small bowel, with dilatation of more proximal small bowel loops consistent with probable incarceration and obstruction. Moderate size sliding-type hiatal hernia. Sigmoid diverticulosis without inflammation. Mild urinary bladder distention. Cholelithiasis. Aortic Atherosclerosis   01-10-18: ct of abdomen and pelvis:  1. Prior right hemicolectomy. Right lateral wall hernia through which traverses small bowel, vessels and fat. The small bowel within the hernia appears dilated and inflamed with surrounding fluid. 2. Marked amount of stool rectosigmoid region. Prominent diverticula throughout the colon without extraluminal  inflammation of diverticula. 3. Small to slightly moderate-size hiatal hernia. 4. Gallstones. 5. 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 6. Remote L1 compression fracture with kyphosis centered at this level.   LABS REVIEWED TODAY:   10-01-17: wbc 11.7; hgb 12.5; hct 37.7; mcv 85.2; plt 321 10-22-17: glucose 143; bun 22; creat 0.88; k+ 4.4; na++ 132; ca 8.7  TODAY:  11-24-17: wbc 12.5; hgb 11.5; hct 33.8; mcv 85.8; plt 431; glucose 117; bun 26; creat 0.97; k+ 4.4; na++ 136; ca 8.6 liver normal albumin 2.9 urine culture: no growth  12-06-17: urine culture: aerococcus urinae  01-10-18: wbc 17.4; hgb 10.8; hct 34.4; mcv 82.1; plt 525 glucose 120; bun 18; creat 0.78; k+ 5.0; na++ 134; ca 8.5; liver normal albumin 3.2  01-11-18: wbc 10.0; hgb 9.7; hct 30.9; mcv 81.7; plt 464; glucose 98; bun 16; creat 0.79; k+ 3.9; na++ 135; ca 8.3; liver normal albumin 3.0    Review of Systems  Reason unable to perform ROS: poor historian   Constitutional: Negative for malaise/fatigue.  Respiratory: Negative for cough.   Cardiovascular: Negative for chest pain.  Gastrointestinal: Negative for abdominal pain and constipation.  Musculoskeletal: Negative for joint pain and myalgias.  Skin: Negative.   Psychiatric/Behavioral: The patient is not nervous/anxious.     Physical Exam  Constitutional: She appears well-developed and well-nourished. No distress.  Neck: No thyromegaly present.  Cardiovascular: Normal rate, regular rhythm, normal heart sounds and intact distal pulses.  Pulmonary/Chest: Effort normal and breath sounds normal. No respiratory distress.  Abdominal: Soft. Bowel sounds are normal. She exhibits distension. There is no tenderness.  Has mild distention present.  Bowel sounds present in all 4 quads.  Musculoskeletal: She exhibits no edema.  Is able to move all extremities   Lymphadenopathy:    She has no cervical adenopathy.  Neurological: She is alert.    Skin: Skin is warm and dry. She is not diaphoretic.  Psychiatric: She has a normal mood and affect.     ASSESSMENT/ PLAN:  TODAY;   1. Essential benign hypertension:  stable b/p 124/83 will continue cozaar 25 mg daily for b/p >150/90  2. Asthma with COPD: is stable will continue breo inhaler daily; duoneb three times daily and flonase daily   3. Reflux esophagitis: is status post GIB (08/2016) is stable will continue protonix 40 mg daily   4. Hypertensive kidney disease with CKD stage III: is stable bun 16; creat 0.79  5. Frequent UTI: is stable  will continue cranberry daily and trimpex 100 mg daily   6. Primary osteoarthritis right knee: is stable will continue voltaren gel 4 gm to right knee four times daily   7. Anemia of chronic disease: is stable hgb 10.0  8. Protein calorie malnutrition: albumin 3.0 weight is 160 pounds; will continue supplements as directed.   9. Small bowel obstruction: is stable will continue bowel regimen and will monitor her status     MD is aware of resident's narcotic use and is in agreement with current plan of care. We will attempt to wean resident as apropriate   Ok Edwards NP [facility]  Contact [phone] Monday through Friday 8am- 5pm  After hours call [phone]

## 2018-01-19 ENCOUNTER — Emergency Department
Admission: EM | Admit: 2018-01-19 | Discharge: 2018-01-19 | Payer: Medicare Other | Attending: Emergency Medicine | Admitting: Emergency Medicine

## 2018-01-19 ENCOUNTER — Other Ambulatory Visit: Payer: Self-pay

## 2018-01-19 ENCOUNTER — Emergency Department: Payer: Medicare Other

## 2018-01-19 ENCOUNTER — Non-Acute Institutional Stay (SKILLED_NURSING_FACILITY): Payer: Medicare Other | Admitting: Adult Health

## 2018-01-19 ENCOUNTER — Encounter: Payer: Self-pay | Admitting: Adult Health

## 2018-01-19 DIAGNOSIS — K56609 Unspecified intestinal obstruction, unspecified as to partial versus complete obstruction: Secondary | ICD-10-CM

## 2018-01-19 DIAGNOSIS — N183 Chronic kidney disease, stage 3 (moderate): Secondary | ICD-10-CM | POA: Diagnosis not present

## 2018-01-19 DIAGNOSIS — J449 Chronic obstructive pulmonary disease, unspecified: Secondary | ICD-10-CM | POA: Diagnosis not present

## 2018-01-19 DIAGNOSIS — F0390 Unspecified dementia without behavioral disturbance: Secondary | ICD-10-CM | POA: Diagnosis not present

## 2018-01-19 DIAGNOSIS — I129 Hypertensive chronic kidney disease with stage 1 through stage 4 chronic kidney disease, or unspecified chronic kidney disease: Secondary | ICD-10-CM | POA: Diagnosis not present

## 2018-01-19 DIAGNOSIS — K310 Acute dilatation of stomach: Secondary | ICD-10-CM | POA: Diagnosis not present

## 2018-01-19 DIAGNOSIS — R140 Abdominal distension (gaseous): Secondary | ICD-10-CM

## 2018-01-19 DIAGNOSIS — E039 Hypothyroidism, unspecified: Secondary | ICD-10-CM | POA: Diagnosis not present

## 2018-01-19 DIAGNOSIS — R197 Diarrhea, unspecified: Secondary | ICD-10-CM | POA: Insufficient documentation

## 2018-01-19 DIAGNOSIS — R109 Unspecified abdominal pain: Secondary | ICD-10-CM | POA: Diagnosis not present

## 2018-01-19 DIAGNOSIS — R059 Cough, unspecified: Secondary | ICD-10-CM

## 2018-01-19 DIAGNOSIS — R05 Cough: Secondary | ICD-10-CM | POA: Diagnosis not present

## 2018-01-19 DIAGNOSIS — R112 Nausea with vomiting, unspecified: Secondary | ICD-10-CM | POA: Diagnosis present

## 2018-01-19 LAB — COMPREHENSIVE METABOLIC PANEL
ALK PHOS: 73 U/L (ref 38–126)
ALT: 15 U/L (ref 0–44)
AST: 21 U/L (ref 15–41)
Albumin: 3.4 g/dL — ABNORMAL LOW (ref 3.5–5.0)
Anion gap: 11 (ref 5–15)
BUN: 19 mg/dL (ref 8–23)
CALCIUM: 9 mg/dL (ref 8.9–10.3)
CO2: 28 mmol/L (ref 22–32)
CREATININE: 0.91 mg/dL (ref 0.44–1.00)
Chloride: 95 mmol/L — ABNORMAL LOW (ref 98–111)
GFR calc Af Amer: >60 mL/min (ref >60)
GFR calc non Af Amer: 52 mL/min — ABNORMAL LOW (ref >60)
Glucose, Bld: 116 mg/dL — ABNORMAL HIGH (ref 70–99)
Potassium: 4.5 mmol/L (ref 3.5–5.1)
SODIUM: 134 mmol/L — AB (ref 135–145)
Total Bilirubin: 0.5 mg/dL (ref 0.3–1.2)
Total Protein: 6.7 g/dL (ref 6.5–8.1)

## 2018-01-19 LAB — URINALYSIS, COMPLETE (UACMP) WITH MICROSCOPIC
BACTERIA UA: NONE SEEN
Bilirubin Urine: NEGATIVE
Glucose, UA: NEGATIVE mg/dL
Hgb urine dipstick: NEGATIVE
KETONES UR: NEGATIVE mg/dL
Nitrite: NEGATIVE
PH: 6 (ref 5.0–8.0)
PROTEIN: 30 mg/dL — AB
SQUAMOUS EPITHELIAL / LPF: NONE SEEN (ref 0–5)
Specific Gravity, Urine: 1.014 (ref 1.005–1.030)

## 2018-01-19 LAB — CBC
HEMATOCRIT: 37.3 % (ref 36.0–46.0)
HEMOGLOBIN: 11.4 g/dL — AB (ref 12.0–15.0)
MCH: 25 pg — AB (ref 26.0–34.0)
MCHC: 30.6 g/dL (ref 30.0–36.0)
MCV: 81.8 fL (ref 80.0–100.0)
Platelets: 527 10*3/uL — ABNORMAL HIGH (ref 150–400)
RBC: 4.56 MIL/uL (ref 3.87–5.11)
RDW: 15.9 % — ABNORMAL HIGH (ref 11.5–15.5)
WBC: 18.7 10*3/uL — ABNORMAL HIGH (ref 4.0–10.5)
nRBC: 0 % (ref 0.0–0.2)

## 2018-01-19 LAB — LIPASE, BLOOD: Lipase: 29 U/L (ref 11–51)

## 2018-01-19 NOTE — ED Notes (Signed)
Family at bedside. 

## 2018-01-19 NOTE — ED Notes (Signed)
Pt cleaned up and repositioned at this time. Pt's pants put back on and ready for transport once EMS arrives. Family at bedside

## 2018-01-19 NOTE — ED Provider Notes (Signed)
[facility] Emergency Department Provider Note  ____________________________________________  Time seen: Approximately 2:58 PM  I have reviewed the triage vital signs and the nursing notes.   HISTORY  Chief Complaint Nausea; Diarrhea; and Emesis    HPI Beth Chen is a 84 y.o. female with a recent history of small bowel obstruction treated conservatively 10/19, then admission for constipation with intractable nausea and vomiting discharged 01/15/2018, presenting with nausea vomiting and diarrhea which started last night.  Patient is unable to give any history due to dementia; she is accompanied by her daughter, who reports that this symptomatology is unclear.  Her daughter reports that the only thing she has seen has been a cough productive of white sputum without any fevers, chills or shortness of breath.  She was unable to confirm whether there was actually any nausea vomiting or diarrhea last night.  She reports her mother is at normal mental status and that her abdomen is distended as usual.  Past Medical History:  Diagnosis Date  . Allergic rhinitis 11/18/2013  . Asthma with COPD (chronic obstructive pulmonary disease) ([place]) 11/18/2013   Last Assessment & Plan:  No flair ups noted recently and breathing is essentially at baseline.    . Asthma without status asthmaticus    unspecified  . Borderline hypothyroidism 11/18/2013   Last Assessment & Plan:  Energy and tsh are doing well  . Cancer [facility])    colon cancer- surgically removed in 2006  . Cholelithiases   . Chronic kidney disease   . Colon cancer ([place])   . Complete left bundle branch block (LBBB) 11/18/2013  . Constipation 11/18/2013  . Diverticulosis   . Hiatal hernia    right  . HLD (hyperlipidemia) 11/18/2013   Last Assessment & Plan:  Appropriate diet is followed and no myalgia's are noted.    . Hyperlipidemia, unspecified   . Hypertensive kidney disease with CKD stage III ([place]) 11/18/2013   Last  Assessment & Plan:  Is compliant with hypertensive medications without clear side effects or lack of control.  Nausea and itching are not symptomatic and nsaids are being avoided.    . Melanoma ([place])   . Osteopenia     Patient Active Problem List   Diagnosis Date Noted  . Small bowel obstruction ([place]) 01/10/2018  . Intestinal adhesions with complete obstruction ([place]) 12/06/2017  . Essential hypertension, benign 11/20/2017  . Frequent UTI 11/20/2017  . Primary osteoarthritis of right knee 11/20/2017  . Protein-calorie malnutrition ([place]) 08/30/2017  . Reflux esophagitis   . Anemia 05/30/2017  . Generalized weakness 05/20/2017  . Lower GI bleed 09/07/2016  . GI bleed 09/05/2016  . Allergic rhinitis 11/18/2013  . Asthma with COPD (chronic obstructive pulmonary disease) ([place]) 11/18/2013  . Borderline hypothyroidism 11/18/2013  . Complete left bundle branch block (LBBB) 11/18/2013  . Constipation 11/18/2013  . Hypertensive kidney disease with CKD stage III ([place]) 11/18/2013    Past Surgical History:  Procedure Laterality Date  . ABDOMINAL HYSTERECTOMY    . Bladder Lift    . BREAST EXCISIONAL BIOPSY Left 15+ yrs ago   neg  . COLON RESECTION  2006   colon cancer  . COLON SURGERY    . COLON SURGERY Right   . COLONOSCOPY WITH PROPOFOL N/A 06/01/2017   Procedure: COLONOSCOPY WITH PROPOFOL;  Surgeon: [name], MD;  Location: [facility] ENDOSCOPY;  Service: Endoscopy;  Laterality: N/A;  . ESOPHAGOGASTRODUODENOSCOPY (EGD) WITH PROPOFOL N/A 06/01/2017   Procedure: ESOPHAGOGASTRODUODENOSCOPY (EGD) WITH PROPOFOL;  Surgeon: Allen Norris,  Darren, MD;  Location: [facility] ENDOSCOPY;  Service: Endoscopy;  Laterality: N/A;  . HEMICOLECTOMY Right   . SKIN CANCER EXCISION  2006   melanoma    Current Outpatient Rx  . Order #: [id number] Class: Historical Med  . Order #: [id number] Class: Historical Med  . Order #: [id number] Class: Historical Med  . Order #: [id number] Class: Historical Med  . Order #: [id number] Class:  Print  . Order #: [id number] Class: Historical Med  . Order #: [id number] Class: Historical Med  . Order #: [id number] Class: Historical Med  . Order #: [id number] Class: Historical Med  . Order #: [id number] Class: Historical Med  . Order #: [id number] Class: Historical Med  . Order #: [id number] Class: Historical Med  . Order #: [id number] Class: Historical Med  . Order #: [id number] Class: Historical Med  . Order #: [id number] Class: Historical Med  . Order #: [id number] Class: Print  . Order #: [id number] Class: Historical Med  . Order #: [id number] Class: Historical Med    Allergies Patient has no known allergies.  Family History  Problem Relation Age of Onset  . Breast cancer Daughter 93       passed at 65 from breast cancer  . Heart disease Mother   . Heart failure Mother   . Lung cancer Father     Social History Social History   Tobacco Use  . Smoking status: Never Smoker  . Smokeless tobacco: Never Used  Substance Use Topics  . Alcohol use: No  . Drug use: Never    Review of Systems Constitutional: No fever/chills. Eyes: No visual changes. ENT: No sore throat. No congestion or rhinorrhea. Cardiovascular: Denies chest pain. Denies palpitations. Respiratory: Denies shortness of breath.  No cough. Gastrointestinal: No abdominal pain.  Has a stable abdominal distention.  Unclear nausea or vomiting.   Last bowel movement is unknown.  Patient's daughter states that she has been receiving MiraLAX daily. Genitourinary: Negative for dysuria. Musculoskeletal: Negative for back pain. Skin: Negative for rash. Neurological: Negative for headaches. No focal numbness, tingling or weakness.     ____________________________________________   PHYSICAL EXAM:  VITAL SIGNS: ED Triage Vitals  Enc Vitals Group     BP 01/19/18 1433 114/85     Pulse Rate 01/19/18 1433 99     Resp 01/19/18 1433 19     Temp 01/19/18 1433 (!) 97.5 F (36.4 C)     Temp Source 01/19/18 1433 Oral     SpO2 01/19/18  1433 94 %     Weight 01/19/18 1434 160 lb (72.6 kg)     Height 01/19/18 1434 5' (1.524 m)     Head Circumference --      Peak Flow --      Pain Score --      Pain Loc --      Pain Edu? --      Excl. in [place]? --     Constitutional: Alert. Answers questions appropriately.  Chronically ill-appearing but comfortable. Eyes: Conjunctivae are normal.  EOMI. No scleral icterus. Head: Atraumatic. Nose: No congestion/rhinnorhea. Mouth/Throat: Mucous membranes are moist.  Neck: No stridor.  Supple.  No JVD.  No meningismus. Cardiovascular: Normal rate, regular rhythm. No murmurs, rubs or gallops.  Respiratory: Normal respiratory effort.  No accessory muscle use or retractions. Lungs CTAB.  No wheezes, rales or ronchi. Gastrointestinal: Soft, nontender.  Distended without any obvious tenderness to palpation.  No guarding or rebound.  No peritoneal signs. Musculoskeletal: No LE edema.  Neurologic: alert and answers some questions.Marland Kitchen  Speech is clear.  Face and smile are symmetric.  EOMI.  Moves all extremities well. Skin:  Skin is warm, dry and intact. No rash noted. Psychiatric: Mood and affect are normal. Speech and behavior are normal.  Normal judgement.  ____________________________________________   LABS (all labs ordered are listed, but only abnormal results are displayed)  Labs Reviewed  CBC - Abnormal; Notable for the following components:      Result Value   WBC 18.7 (*)    Hemoglobin 11.4 (*)    MCH 25.0 (*)    RDW 15.9 (*)    Platelets 527 (*)    All other components within normal limits  COMPREHENSIVE METABOLIC PANEL - Abnormal; Notable for the following components:   Sodium 134 (*)    Chloride 95 (*)    Glucose, Bld 116 (*)    Albumin 3.4 (*)    GFR calc non Af Amer 52 (*)    All other components within normal limits  URINALYSIS, COMPLETE (UACMP) WITH MICROSCOPIC - Abnormal; Notable for the following components:   Color, Urine YELLOW (*)    APPearance TURBID (*)     Protein, ur 30 (*)    Leukocytes, UA LARGE (*)    WBC, UA >50 (*)    All other components within normal limits  LIPASE, BLOOD   ____________________________________________  EKG  ED ECG REPORT I, Anne-Caroline Mariea Clonts, the attending physician, personally viewed and interpreted this ECG.   Date: 01/19/2018  EKG Time: 1559  Rate: 79  Rhythm: normal sinus rhythm with sinus arrhytmia; LBBB  Axis: leftward  Intervals:prolonged QTc  ST&T Change: No STEMI   This EKG is compared to 01/10/2018 and is unchanged compared to prior.  ____________________________________________  RADIOLOGY  Dg Chest 2 View  Result Date: 01/19/2018 CLINICAL DATA:  Initial evaluation for acute nausea, vomiting, diarrhea. EXAM: CHEST - 2 VIEW COMPARISON:  Prior radiograph from 12/06/2017. FINDINGS: Mild cardiomegaly, stable. Mediastinal silhouette within normal limits. Tortuosity the intrathoracic aorta noted. Lungs well inflated. Curvilinear left basilar atelectasis noted. No focal infiltrates. No pulmonary edema or pleural effusion. No pneumothorax. Chronic appearing L1 compression deformity. No acute osseous abnormality. IMPRESSION: 1. Mild left basilar atelectasis. No other active cardiopulmonary disease. 2. Chronic L1 compression deformity. Electronically Signed   By: Jeannine Boga M.D.   On: 01/19/2018 15:57   Dg Abdomen 1 View  Result Date: 01/19/2018 CLINICAL DATA:  Initial evaluation for acute abdominal pain, nausea, vomiting. EXAM: ABDOMEN - 1 VIEW COMPARISON:  Prior CT from 01/10/2018 FINDINGS: Suture material overlies the right abdomen, consistent with history of prior right hemicolectomy. No evidence for obstruction or ileus. Large volume retained stool within the colon adjacent to the right abdominal anastomotic suture. No abnormal bowel wall thickening. No appreciable free air on this single supine view the abdomen. Calcification overlying the right upper quadrant suggestive of cholelithiasis.  Moderate multilevel degenerative spondylolysis noted within the visualized spine chronic L1 compression deformity, grossly similar to previous. No acute osseous abnormality. IMPRESSION: 1. Nonobstructive bowel gas pattern with large volume retained stool within the colon adjacent to the right abdominal anastomotic suture. 2. Cholelithiasis. 3. No other radiographic evidence for acute abnormality within the abdomen. Electronically Signed   By: Jeannine Boga M.D.   On: 01/19/2018 16:06    ____________________________________________   PROCEDURES  Procedure(s) performed: None  Procedures  Critical Care performed: No ____________________________________________   INITIAL IMPRESSION / ASSESSMENT AND PLAN / ED COURSE  Pertinent labs & imaging results that were available during my care of the patient were reviewed by me and  considered in my medical decision making (see chart for details).  84 y.o. female with a recent history of obstruction presenting for possible nausea vomiting or diarrhea, but more likely a cough productive of white sputum.  Overall, the patient is hemodynamically stable and well-appearing on my exam.  However she does have some abdominal distention although this is reportedly at baseline.  We will get a chest x-ray, abdominal x-ray and basic laboratory studies including UA.  Plan reevaluation for final disposition.  ----------------------------------------- 5:03 PM on 01/19/2018 -----------------------------------------  She is work-up in the emergency department has been reassuring.  She has reassuring electrolytes, and stable blood counts.  Her white blood cell count is 18 today, but she has chronically intermittently elevated white blood cell counts and has no evidence of acute infection today.  She is afebrile.  Her urinalysis has white blood cells and leukocyte esterase; she does not have any bacteria in her urine and has not been complaining in any dysuria.  At  this time, we will plan to send a culture; antibiotics are not indicated.  This will be followed up by the patient's primary care physician.  The patient's chest x-ray does not show pneumonia.  Her abdominal x-ray does not show any evidence of obstruction.  I discussed the patient's findings with the patient and her daughter, who understand the plan for discharge.  Follow-up instructions as well as return precautions were discussed.  ____________________________________________  FINAL CLINICAL IMPRESSION(S) / ED DIAGNOSES  Final diagnoses:  Cough  Abdominal distention         NEW MEDICATIONS STARTED DURING THIS VISIT:  New Prescriptions   No medications on file      Eula Listen, MD 01/19/18 1707

## 2018-01-19 NOTE — ED Triage Notes (Signed)
Pt comes via [facility] from [facility] with c/o N/V/D since yesterday. Pt has hx of dementia. Pt recently seen here for bowel obstruction. EMS reports VSS. Pt is alert and denies any pain at this time.

## 2018-01-19 NOTE — ED Notes (Signed)
Pt cleaned up and repositioned in bed at this time 

## 2018-01-19 NOTE — Progress Notes (Signed)
Location:   [place] [facility] Room Number: [place] of Service:      CODE STATUS: DNR  No Known Allergies  Chief Complaint  Patient presents with  . Acute Visit    Change in Status    HPI:  She has been having nausea and vomiting. Her family is concerned that she is having another SBO. There are no reports of fevers present. Her po intake today has been poor. Staff reports that her bowels are moving daily. Her family would like for her to go to the ED for further workup.   Past Medical History:  Diagnosis Date  . Allergic rhinitis 11/18/2013  . Asthma with COPD (chronic obstructive pulmonary disease) ([place]) 11/18/2013   Last Assessment & Plan:  No flair ups noted recently and breathing is essentially at baseline.    . Asthma without status asthmaticus    unspecified  . Borderline hypothyroidism 11/18/2013   Last Assessment & Plan:  Energy and tsh are doing well  . Cancer [facility])    colon cancer- surgically removed in 2006  . Cholelithiases   . Chronic kidney disease   . Colon cancer ([place])   . Complete left bundle branch block (LBBB) 11/18/2013  . Constipation 11/18/2013  . Diverticulosis   . Hiatal hernia    right  . HLD (hyperlipidemia) 11/18/2013   Last Assessment & Plan:  Appropriate diet is followed and no myalgia's are noted.    . Hyperlipidemia, unspecified   . Hypertensive kidney disease with CKD stage III ([place]) 11/18/2013   Last Assessment & Plan:  Is compliant with hypertensive medications without clear side effects or lack of control.  Nausea and itching are not symptomatic and nsaids are being avoided.    . Melanoma ([place])   . Osteopenia     Past Surgical History:  Procedure Laterality Date  . ABDOMINAL HYSTERECTOMY    . Bladder Lift    . BREAST EXCISIONAL BIOPSY Left 15+ yrs ago   neg  . COLON RESECTION  2006   colon cancer  . COLON SURGERY    . COLON SURGERY Right   . COLONOSCOPY WITH PROPOFOL N/A 06/01/2017   Procedure: COLONOSCOPY  WITH PROPOFOL;  Surgeon: [name], MD;  Location: [facility] ENDOSCOPY;  Service: Endoscopy;  Laterality: N/A;  . ESOPHAGOGASTRODUODENOSCOPY (EGD) WITH PROPOFOL N/A 06/01/2017   Procedure: ESOPHAGOGASTRODUODENOSCOPY (EGD) WITH PROPOFOL;  Surgeon: [name], MD;  Location: [facility] ENDOSCOPY;  Service: Endoscopy;  Laterality: N/A;  . HEMICOLECTOMY Right   . SKIN CANCER EXCISION  2006   melanoma    Social History   Socioeconomic History  . Marital status: Widowed    Spouse name: Not on file  . Number of children: Not on file  . Years of education: 54  . Highest education level: Bachelor's degree (e.g., BA, AB, BS)  Occupational History  . 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 Financial resource strain: Not on file  . Food insecurity:    Worry: Not on file    Inability: Not on file  . Transportation needs:    Medical: Not on file    Non-medical: Not on file  Tobacco Use  . Smoking status: Never Smoker  . Smokeless tobacco: Never Used  Substance and Sexual Activity  . Alcohol use: No  . Drug use: Never  . Sexual activity: Not on file  Lifestyle  . Physical activity:    Days per week: Not on file    Minutes per session:  Not on file  . Stress: Not on file  Relationships  . Social connections:    Talks on phone: Not on file    Gets together: Not on file    Attends religious service: Not on file    Active member of club or organization: Not on file    Attends meetings of clubs or organizations: Not on file    Relationship status: Not on file  . Intimate partner violence:    Fear of current or ex partner: Not on file    Emotionally abused: Not on file    Physically abused: Not on file    Forced sexual activity: Not on file  Other Topics Concern  . Not on file  Social History Narrative   From assisted living facility, however after recent discharge from the hospital-discharge to short-term rehab   -Most transfers all through wheelchair      Non smoker   No smokeless tobacco  use   No alcohol use   Widowed   DNR with HCPOA and Living Will    Family History  Problem Relation Age of Onset  . Breast cancer Daughter 66       passed at 66 from breast cancer  . Heart disease Mother   . Heart failure Mother   . Lung cancer Father       VITAL SIGNS BP (!) 144/91   Pulse 75   Temp 97.7 F (36.5 C)   Resp 20   Ht 5' (1.524 m)   Wt 160 lb (72.6 kg)   SpO2 96%   BMI 31.25 kg/m   Outpatient Encounter Medications as of 01/19/2018  Medication Sig  . acetaminophen (TYLENOL) 325 MG tablet Take 650 mg by mouth every 4 (four) hours as needed for mild pain, moderate pain or fever.   . bisacodyl (DULCOLAX) 10 MG suppository Place 10 mg rectally 2 (two) times daily as needed for mild constipation or moderate constipation.   . calcium carbonate (TUMS EX) 750 MG chewable tablet Chew 2 tablets by mouth daily.   . diclofenac sodium (VOLTAREN) 1 % GEL Apply 4 g topically 4 (four) times daily. To right knee  . docusate sodium (COLACE) 100 MG capsule Take 1 capsule (100 mg total) by mouth 2 (two) times daily.  . fluticasone (FLONASE) 50 MCG/ACT nasal spray Place 2 sprays into both nostrils daily.   . fluticasone furoate-vilanterol (BREO ELLIPTA) 100-25 MCG/INH AEPB Inhale 1 puff into the lungs daily.   Marland Kitchen ipratropium-albuterol (DUONEB) 0.5-2.5 (3) MG/3ML SOLN Take 3 mLs by nebulization 3 (three) times daily.   Marland Kitchen lactulose (CHRONULAC) 10 GM/15ML solution Take 30 g by mouth 2 (two) times daily as needed for mild constipation or moderate constipation.   Marland Kitchen losartan (COZAAR) 25 MG tablet Take 25 mg by mouth daily as needed (BP >150/90).   . nystatin-triamcinolone (MYCOLOG II) cream Apply 1 application topically 2 (two) times daily. apply nystatin cream under the right breast to treat yeast infection/irritation  . ondansetron (ZOFRAN) 4 MG tablet Take 4 mg by mouth every 6 (six) hours as needed for nausea or vomiting.  . pantoprazole (PROTONIX) 40 MG tablet Take 40 mg by mouth  daily before breakfast. before a meal to help with esophagus problems   . polyethylene glycol (MIRALAX / GLYCOLAX) packet Take 17 g by mouth at bedtime.   . Probiotic Product (RISA-BID PROBIOTIC) TABS Take 1 tablet by mouth 2 (two) times daily.  . psyllium (METAMUCIL) 0.52 g capsule Take 1 capsule (  0.52 g total) by mouth daily.  . sodium phosphate (FLEET) 7-19 GM/118ML ENEM Place 1 enema rectally every three (3) days as needed for mild constipation or moderate constipation. Once every 3 days as needed   . trimethoprim (TRIMPEX) 100 MG tablet Take 100 mg by mouth daily.    No facility-administered encounter medications on file as of 01/19/2018.      SIGNIFICANT DIAGNOSTIC EXAMS   PREVIOUS;  11-24-17: KUB: no specific bowel gas pattern with no definite acute intra-abdominal pathology.   12-06-17: ct of abdomen and pelvis Large right lateral hernia is noted in right lower quadrant of the abdomen which contains several loops of dilated small bowel, with dilatation of more proximal small bowel loops consistent with probable incarceration and obstruction. Moderate size sliding-type hiatal hernia. Sigmoid diverticulosis without inflammation. Mild urinary bladder distention. Cholelithiasis. Aortic Atherosclerosis   01-10-18: ct of abdomen and pelvis:  1. Prior right hemicolectomy. Right lateral wall hernia through which traverses small bowel, vessels and fat. The small bowel within the hernia appears dilated and inflamed with surrounding fluid. 2. Marked amount of stool rectosigmoid region. Prominent diverticula throughout the colon without extraluminal inflammation of diverticula. 3. Small to slightly moderate-size hiatal hernia. 4. Gallstones. 5. 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 6. Remote L1 compression fracture with kyphosis centered at this level.  NO NEW EXAMS.    LABS REVIEWED PREVIOUS:   10-01-17: wbc 11.7; hgb 12.5; hct 37.7; mcv 85.2; plt  321 10-22-17: glucose 143; bun 22; creat 0.88; k+ 4.4; na++ 132; ca 8.7 11-24-17: wbc 12.5; hgb 11.5; hct 33.8; mcv 85.8; plt 431; glucose 117; bun 26; creat 0.97; k+ 4.4; na++ 136; ca 8.6 liver normal albumin 2.9 urine culture: no growth  12-06-17: urine culture: aerococcus urinae  01-10-18: wbc 17.4; hgb 10.8; hct 34.4; mcv 82.1; plt 525 glucose 120; bun 18; creat 0.78; k+ 5.0; na++ 134; ca 8.5; liver normal albumin 3.2  01-11-18: wbc 10.0; hgb 9.7; hct 30.9; mcv 81.7; plt 464; glucose 98; bun 16; creat 0.79; k+ 3.9; na++ 135; ca 8.3; liver normal albumin 3.0   NO NEW LABS.   Review of Systems  Unable to perform ROS: Other (poor historian )    Physical Exam  Constitutional: She appears well-developed and well-nourished. No distress.  Neck: No thyromegaly present.  Cardiovascular: Normal rate, regular rhythm, normal heart sounds and intact distal pulses.  Pulmonary/Chest: Effort normal and breath sounds normal. No respiratory distress.  Abdominal: Soft. She exhibits distension. There is no tenderness.  Bowel sounds hypoactive Is tympanic to percussion No stool present in rectal vault   Musculoskeletal: She exhibits no edema.  Is able to move all extremities   Lymphadenopathy:    She has no cervical adenopathy.  Neurological: She is alert.  Skin: Skin is warm and dry. She is not diaphoretic.  Psychiatric: She has a normal mood and affect.     ASSESSMENT/ PLAN:  TODAY;   1. Small bowel obstruction: will send to the ED for further evaluation per family request.      MD is aware of resident's narcotic use and is in agreement with current plan of care. We will attempt to wean resident as apropriate   Ok Edwards NP [facility]  Contact [phone] Monday through Friday 8am- 5pm  After hours call [phone]

## 2018-01-19 NOTE — Discharge Instructions (Addendum)
These make an appointment with your primary care physician to be reevaluated.  Your doctor will need to follow-up the results of your urine culture.  In addition, your doctor will follow up on your cough symptoms.  Return to the emergency department if you develop severe pain, lightheadedness or fainting, nausea vomiting or diarrhea, fever, or any other symptoms concerning to you.

## 2018-01-19 NOTE — ED Notes (Signed)
Called lab and spoke with [facility] and she stated they would add on to blood that was just sent down.

## 2018-01-19 NOTE — ED Notes (Signed)
Report given to Eustace Pen RN at [facility] and updated on pt's plan of care.

## 2018-01-19 NOTE — ED Notes (Signed)
Pt given meal tray and sprite. Son at bedside

## 2018-01-19 NOTE — ED Notes (Signed)
Called lab and request that they add on urine culture to urine already in lab. Lab stated that they would.

## 2018-01-19 NOTE — ED Notes (Signed)
Esign not working at this time. Pt's family verbalized discharge instructions and has no questions at this time.

## 2018-01-20 ENCOUNTER — Encounter: Payer: Self-pay | Admitting: Adult Health

## 2018-01-20 ENCOUNTER — Non-Acute Institutional Stay (SKILLED_NURSING_FACILITY): Payer: Medicare Other | Admitting: Adult Health

## 2018-01-20 DIAGNOSIS — F0390 Unspecified dementia without behavioral disturbance: Secondary | ICD-10-CM | POA: Diagnosis not present

## 2018-01-20 DIAGNOSIS — K5652 Intestinal adhesions [bands] with complete obstruction: Secondary | ICD-10-CM | POA: Diagnosis not present

## 2018-01-20 NOTE — Progress Notes (Signed)
[facility]:   [facility] Room Number: [place] of Service:  SNF (31)   CODE STATUS: DNR  No Known Allergies  Chief Complaint  Patient presents with  . Acute Visit    ER Follow up    HPI:  Her family had requested an ED evaluation of her abdominal distension and a cough of clear sputum. The work was negative. She is unable to fully participate in the hpi or ros. There are no reports of fevers; does have a poor appetite; her abdomen is distended with hypoactive bowel sounds.  Her daughter is open to a hospice consult.   Past Medical History:  Diagnosis Date  . Allergic rhinitis 11/18/2013  . Asthma with COPD (chronic obstructive pulmonary disease) ([place]) 11/18/2013   Last Assessment & Plan:  No flair ups noted recently and breathing is essentially at baseline.    . Asthma without status asthmaticus    unspecified  . Borderline hypothyroidism 11/18/2013   Last Assessment & Plan:  Energy and tsh are doing well  . Cancer [facility])    colon cancer- surgically removed in 2006  . Cholelithiases   . Chronic kidney disease   . Colon cancer ([place])   . Complete left bundle branch block (LBBB) 11/18/2013  . Constipation 11/18/2013  . Diverticulosis   . Hiatal hernia    right  . HLD (hyperlipidemia) 11/18/2013   Last Assessment & Plan:  Appropriate diet is followed and no myalgia's are noted.    . Hyperlipidemia, unspecified   . Hypertensive kidney disease with CKD stage III ([place]) 11/18/2013   Last Assessment & Plan:  Is compliant with hypertensive medications without clear side effects or lack of control.  Nausea and itching are not symptomatic and nsaids are being avoided.    . Melanoma ([place])   . Osteopenia     Past Surgical History:  Procedure Laterality Date  . ABDOMINAL HYSTERECTOMY    . Bladder Lift    . BREAST EXCISIONAL BIOPSY Left 15+ yrs ago   neg  . COLON RESECTION  2006   colon cancer  . COLON SURGERY    . COLON SURGERY Right   . COLONOSCOPY  WITH PROPOFOL N/A 06/01/2017   Procedure: COLONOSCOPY WITH PROPOFOL;  Surgeon: [name], MD;  Location: [facility] ENDOSCOPY;  Service: Endoscopy;  Laterality: N/A;  . ESOPHAGOGASTRODUODENOSCOPY (EGD) WITH PROPOFOL N/A 06/01/2017   Procedure: ESOPHAGOGASTRODUODENOSCOPY (EGD) WITH PROPOFOL;  Surgeon: [name], MD;  Location: [facility] ENDOSCOPY;  Service: Endoscopy;  Laterality: N/A;  . HEMICOLECTOMY Right   . SKIN CANCER EXCISION  2006   melanoma    Social History   Socioeconomic History  . Marital status: Widowed    Spouse name: Not on file  . Number of children: Not on file  . Years of education: 20  . Highest education level: Bachelor's degree (e.g., BA, AB, BS)  Occupational History  . 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 Financial resource strain: Not on file  . Food insecurity:    Worry: Not on file    Inability: Not on file  . Transportation needs:    Medical: Not on file    Non-medical: Not on file  Tobacco Use  . Smoking status: Never Smoker  . Smokeless tobacco: Never Used  Substance and Sexual Activity  . Alcohol use: No  . Drug use: Never  . Sexual activity: Not on file  Lifestyle  . Physical activity:    Days per week: Not on  file    Minutes per session: Not on file  . Stress: Not on file  Relationships  . Social connections:    Talks on phone: Not on file    Gets together: Not on file    Attends religious service: Not on file    Active member of club or organization: Not on file    Attends meetings of clubs or organizations: Not on file    Relationship status: Not on file  . Intimate partner violence:    Fear of current or ex partner: Not on file    Emotionally abused: Not on file    Physically abused: Not on file    Forced sexual activity: Not on file  Other Topics Concern  . Not on file  Social History Narrative   From assisted living facility, however after recent discharge from the hospital-discharge to short-term rehab   -Most transfers all through  wheelchair      Non smoker   No smokeless tobacco use   No alcohol use   Widowed   DNR with HCPOA and Living Will    Family History  Problem Relation Age of Onset  . Breast cancer Daughter 6       passed at 59 from breast cancer  . Heart disease Mother   . Heart failure Mother   . Lung cancer Father       VITAL SIGNS BP (!) 144/91   Pulse 75   Temp 97.7 F (36.5 C)   Resp 20   Ht 5' (1.524 m)   Wt 160 lb (72.6 kg)   SpO2 96%   BMI 31.25 kg/m   Outpatient Encounter Medications as of 01/20/2018  Medication Sig  . acetaminophen (TYLENOL) 325 MG tablet Take 650 mg by mouth every 4 (four) hours as needed for mild pain, moderate pain or fever.   . bisacodyl (DULCOLAX) 10 MG suppository Place 10 mg rectally 2 (two) times daily as needed for mild constipation or moderate constipation.   . calcium carbonate (TUMS EX) 750 MG chewable tablet Chew 2 tablets by mouth daily.   . diclofenac sodium (VOLTAREN) 1 % GEL Apply 4 g topically 4 (four) times daily. To right knee  . docusate sodium (COLACE) 100 MG capsule Take 1 capsule (100 mg total) by mouth 2 (two) times daily.  . fluticasone (FLONASE) 50 MCG/ACT nasal spray Place 2 sprays into both nostrils daily.   . fluticasone furoate-vilanterol (BREO ELLIPTA) 100-25 MCG/INH AEPB Inhale 1 puff into the lungs daily.   Marland Kitchen ipratropium-albuterol (DUONEB) 0.5-2.5 (3) MG/3ML SOLN Take 3 mLs by nebulization 3 (three) times daily.   Marland Kitchen lactulose (CHRONULAC) 10 GM/15ML solution Take 30 g by mouth 2 (two) times daily as needed for mild constipation or moderate constipation.   Marland Kitchen losartan (COZAAR) 25 MG tablet Take 25 mg by mouth daily as needed (BP >150/90).   . nystatin-triamcinolone (MYCOLOG II) cream Apply 1 application topically 2 (two) times daily. apply nystatin cream under the right breast to treat yeast infection/irritation  . ondansetron (ZOFRAN) 4 MG tablet Take 4 mg by mouth every 6 (six) hours as needed for nausea or vomiting.  .  pantoprazole (PROTONIX) 40 MG tablet Take 40 mg by mouth daily before breakfast. before a meal to help with esophagus problems   . polyethylene glycol (MIRALAX / GLYCOLAX) packet Take 17 g by mouth at bedtime.   . Probiotic Product (RISA-BID PROBIOTIC) TABS Take 1 tablet by mouth 2 (two) times daily.  . psyllium (  METAMUCIL) 0.52 g capsule Take 1 capsule (0.52 g total) by mouth daily.  . sodium phosphate (FLEET) 7-19 GM/118ML ENEM Place 1 enema rectally every three (3) days as needed for mild constipation or moderate constipation. Once every 3 days as needed   . trimethoprim (TRIMPEX) 100 MG tablet Take 100 mg by mouth daily.    No facility-administered encounter medications on file as of 01/20/2018.      SIGNIFICANT DIAGNOSTIC EXAMS   PREVIOUS;  11-24-17: KUB: no specific bowel gas pattern with no definite acute intra-abdominal pathology.   12-06-17: ct of abdomen and pelvis Large right lateral hernia is noted in right lower quadrant of the abdomen which contains several loops of dilated small bowel, with dilatation of more proximal small bowel loops consistent with probable incarceration and obstruction. Moderate size sliding-type hiatal hernia. Sigmoid diverticulosis without inflammation. Mild urinary bladder distention. Cholelithiasis. Aortic Atherosclerosis   01-10-18: ct of abdomen and pelvis:  1. Prior right hemicolectomy. Right lateral wall hernia through which traverses small bowel, vessels and fat. The small bowel within the hernia appears dilated and inflamed with surrounding fluid. 2. Marked amount of stool rectosigmoid region. Prominent diverticula throughout the colon without extraluminal inflammation of diverticula. 3. Small to slightly moderate-size hiatal hernia. 4. Gallstones. 5. 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 6. Remote L1 compression fracture with kyphosis centered at this level.   TODAY:   01-19-18: chest x-ray:  1. Mild  left basilar atelectasis. No other active cardiopulmonary disease. 2. Chronic L1 compression deformity.  01-19-18: kub:  1. Nonobstructive bowel gas pattern with large volume retained stoolwithin the colon adjacent to the right abdominal anastomotic suture. 2. Cholelithiasis. 3. No other radiographic evidence for acute abnormality within theabdomen   LABS REVIEWED PREVIOUS:   10-01-17: wbc 11.7; hgb 12.5; hct 37.7; mcv 85.2; plt 321 10-22-17: glucose 143; bun 22; creat 0.88; k+ 4.4; na++ 132; ca 8.7 11-24-17: wbc 12.5; hgb 11.5; hct 33.8; mcv 85.8; plt 431; glucose 117; bun 26; creat 0.97; k+ 4.4; na++ 136; ca 8.6 liver normal albumin 2.9 urine culture: no growth  12-06-17: urine culture: aerococcus urinae  01-10-18: wbc 17.4; hgb 10.8; hct 34.4; mcv 82.1; plt 525 glucose 120; bun 18; creat 0.78; k+ 5.0; na++ 134; ca 8.5; liver normal albumin 3.2  01-11-18: wbc 10.0; hgb 9.7; hct 30.9; mcv 81.7; plt 464; glucose 98; bun 16; creat 0.79; k+ 3.9; na++ 135; ca 8.3; liver normal albumin 3.0   TODAY:   01-19-18: wbc 18.7; hgb 11.4; hct 37.3; mcv 81.8; plt 527; glucose: 116; bun 19; creat 0.91; k+ 4.5; na++ 134; ca 9.0; liver normal albumin 3.4 urine culture: enterococcus faecalis    Review of Systems  Reason unable to perform ROS: poor historian     Physical Exam  Constitutional: She appears well-developed and well-nourished. No distress.  Neck: No thyromegaly present.  Cardiovascular: Normal rate, regular rhythm, normal heart sounds and intact distal pulses.  Pulmonary/Chest: Effort normal and breath sounds normal. No respiratory distress.  Abdominal: Soft. Bowel sounds are normal. She exhibits distension. There is no tenderness.  Bowel sounds hypoactive Is tympanic to percussion No stool present in rectal vault    Musculoskeletal: She exhibits no edema.  Is able to move all extremities   Lymphadenopathy:    She has no cervical adenopathy.  Neurological: She is alert.  Skin: Skin is  warm and dry. She is not diaphoretic.  Psychiatric: She has a normal mood and affect.  ASSESSMENT/ PLAN:  TODAY:   1. Intestinal adhesions with complete obstruction 2. Dementia arising in senium and presenium  Will continue to focus upon her comfort Will setup a hospice consult and will monitor her status.     MD is aware of resident's narcotic use and is in agreement with current plan of care. We will attempt to wean resident as apropriate   Ok Edwards NP [facility] Adult [facility]  Contact [phone] Monday through Friday 8am- 5pm  After hours call [phone]

## 2018-01-21 ENCOUNTER — Encounter: Payer: Self-pay | Admitting: Adult Health

## 2018-01-21 ENCOUNTER — Non-Acute Institutional Stay (SKILLED_NURSING_FACILITY): Payer: Medicare Other | Admitting: Adult Health

## 2018-01-21 DIAGNOSIS — B952 Enterococcus as the cause of diseases classified elsewhere: Secondary | ICD-10-CM

## 2018-01-21 DIAGNOSIS — N390 Urinary tract infection, site not specified: Secondary | ICD-10-CM

## 2018-01-21 NOTE — Progress Notes (Signed)
Location:   [facility] Room Number: [place] of Service:  SNF (31)   CODE STATUS: DNR  No Known Allergies  Chief Complaint  Patient presents with  . Acute Visit    Family concerns    HPI:  Her urine culture has come demonstrating an uti. She is unable to fully participating in the hpi or ros. There are no reports of fevers; her po intake is poor; her abdomen remains distended.   Past Medical History:  Diagnosis Date  . Allergic rhinitis 11/18/2013  . Asthma with COPD (chronic obstructive pulmonary disease) ([place]) 11/18/2013   Last Assessment & Plan:  No flair ups noted recently and breathing is essentially at baseline.    . Asthma without status asthmaticus    unspecified  . Borderline hypothyroidism 11/18/2013   Last Assessment & Plan:  Energy and tsh are doing well  . Cancer [facility])    colon cancer- surgically removed in 2006  . Cholelithiases   . Chronic kidney disease   . Colon cancer ([place])   . Complete left bundle branch block (LBBB) 11/18/2013  . Constipation 11/18/2013  . Diverticulosis   . Hiatal hernia    right  . HLD (hyperlipidemia) 11/18/2013   Last Assessment & Plan:  Appropriate diet is followed and no myalgia's are noted.    . Hyperlipidemia, unspecified   . Hypertensive kidney disease with CKD stage III ([place]) 11/18/2013   Last Assessment & Plan:  Is compliant with hypertensive medications without clear side effects or lack of control.  Nausea and itching are not symptomatic and nsaids are being avoided.    . Melanoma ([place])   . Osteopenia     Past Surgical History:  Procedure Laterality Date  . ABDOMINAL HYSTERECTOMY    . Bladder Lift    . BREAST EXCISIONAL BIOPSY Left 15+ yrs ago   neg  . COLON RESECTION  2006   colon cancer  . COLON SURGERY    . COLON SURGERY Right   . COLONOSCOPY WITH PROPOFOL N/A 06/01/2017   Procedure: COLONOSCOPY WITH PROPOFOL;  Surgeon: [name], MD;  Location: [facility] ENDOSCOPY;  Service: Endoscopy;   Laterality: N/A;  . ESOPHAGOGASTRODUODENOSCOPY (EGD) WITH PROPOFOL N/A 06/01/2017   Procedure: ESOPHAGOGASTRODUODENOSCOPY (EGD) WITH PROPOFOL;  Surgeon: [name], MD;  Location: [facility] ENDOSCOPY;  Service: Endoscopy;  Laterality: N/A;  . HEMICOLECTOMY Right   . SKIN CANCER EXCISION  2006   melanoma    Social History   Socioeconomic History  . Marital status: Widowed    Spouse name: Not on file  . Number of children: Not on file  . Years of education: 10  . Highest education level: Bachelor's degree (e.g., BA, AB, BS)  Occupational History  . 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 Financial resource strain: Not on file  . Food insecurity:    Worry: Not on file    Inability: Not on file  . Transportation needs:    Medical: Not on file    Non-medical: Not on file  Tobacco Use  . Smoking status: Never Smoker  . Smokeless tobacco: Never Used  Substance and Sexual Activity  . Alcohol use: No  . Drug use: Never  . Sexual activity: Not on file  Lifestyle  . Physical activity:    Days per week: Not on file    Minutes per session: Not on file  . Stress: Not on file  Relationships  . Social connections:    Talks on  phone: Not on file    Gets together: Not on file    Attends religious service: Not on file    Active member of club or organization: Not on file    Attends meetings of clubs or organizations: Not on file    Relationship status: Not on file  . Intimate partner violence:    Fear of current or ex partner: Not on file    Emotionally abused: Not on file    Physically abused: Not on file    Forced sexual activity: Not on file  Other Topics Concern  . Not on file  Social History Narrative   From assisted living facility, however after recent discharge from the hospital-discharge to short-term rehab   -Most transfers all through wheelchair      Non smoker   No smokeless tobacco use   No alcohol use   Widowed   DNR with HCPOA and Living Will    Family History    Problem Relation Age of Onset  . Breast cancer Daughter 62       passed at 10 from breast cancer  . Heart disease Mother   . Heart failure Mother   . Lung cancer Father       VITAL SIGNS BP 134/72   Pulse 63   Temp 98.4 F (36.9 C)   Ht 5' (1.524 m)   Wt 160 lb (72.6 kg)   BMI 31.25 kg/m   Outpatient Encounter Medications as of 01/21/2018  Medication Sig  . acetaminophen (TYLENOL) 325 MG tablet Take 650 mg by mouth every 4 (four) hours as needed for mild pain, moderate pain or fever.   . bisacodyl (DULCOLAX) 10 MG suppository Place 10 mg rectally 2 (two) times daily as needed for mild constipation or moderate constipation.   . calcium carbonate (TUMS EX) 750 MG chewable tablet Chew 2 tablets by mouth daily.   . diclofenac sodium (VOLTAREN) 1 % GEL Apply 4 g topically 4 (four) times daily. To right knee  . docusate sodium (COLACE) 100 MG capsule Take 1 capsule (100 mg total) by mouth 2 (two) times daily.  . fluticasone (FLONASE) 50 MCG/ACT nasal spray Place 2 sprays into both nostrils daily.   . fluticasone furoate-vilanterol (BREO ELLIPTA) 100-25 MCG/INH AEPB Inhale 1 puff into the lungs daily.   Marland Kitchen ipratropium-albuterol (DUONEB) 0.5-2.5 (3) MG/3ML SOLN Take 3 mLs by nebulization 3 (three) times daily.   Marland Kitchen lactulose (CHRONULAC) 10 GM/15ML solution Take 30 g by mouth 2 (two) times daily as needed for mild constipation or moderate constipation.   Marland Kitchen losartan (COZAAR) 25 MG tablet Take 25 mg by mouth daily as needed (BP >150/90).   . nystatin-triamcinolone (MYCOLOG II) cream Apply 1 application topically 2 (two) times daily. apply nystatin cream under the right breast to treat yeast infection/irritation  . ondansetron (ZOFRAN) 4 MG tablet Take 4 mg by mouth every 6 (six) hours as needed for nausea or vomiting.  . pantoprazole (PROTONIX) 40 MG tablet Take 40 mg by mouth daily before breakfast. before a meal to help with esophagus problems   . polyethylene glycol (MIRALAX / GLYCOLAX)  packet Take 17 g by mouth at bedtime.   . Probiotic Product (RISA-BID PROBIOTIC) TABS Take 1 tablet by mouth 2 (two) times daily.  . psyllium (METAMUCIL) 0.52 g capsule Take 1 capsule (0.52 g total) by mouth daily.  . sodium phosphate (FLEET) 7-19 GM/118ML ENEM Place 1 enema rectally every three (3) days as needed for mild constipation or  moderate constipation. Once every 3 days as needed   . trimethoprim (TRIMPEX) 100 MG tablet Take 100 mg by mouth daily.    No facility-administered encounter medications on file as of 01/21/2018.      SIGNIFICANT DIAGNOSTIC EXAMS   PREVIOUS;  11-24-17: KUB: no specific bowel gas pattern with no definite acute intra-abdominal pathology.   12-06-17: ct of abdomen and pelvis Large right lateral hernia is noted in right lower quadrant of the abdomen which contains several loops of dilated small bowel, with dilatation of more proximal small bowel loops consistent with probable incarceration and obstruction. Moderate size sliding-type hiatal hernia. Sigmoid diverticulosis without inflammation. Mild urinary bladder distention. Cholelithiasis. Aortic Atherosclerosis   01-10-18: ct of abdomen and pelvis:  1. Prior right hemicolectomy. Right lateral wall hernia through which traverses small bowel, vessels and fat. The small bowel within the hernia appears dilated and inflamed with surrounding fluid. 2. Marked amount of stool rectosigmoid region. Prominent diverticula throughout the colon without extraluminal inflammation of diverticula. 3. Small to slightly moderate-size hiatal hernia. 4. Gallstones. 5. 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 6. Remote L1 compression fracture with kyphosis centered at this level.   TODAY:   01-19-18: chest x-ray:  1. Mild left basilar atelectasis. No other active cardiopulmonary disease. 2. Chronic L1 compression deformity.  01-19-18: kub:  1. Nonobstructive bowel gas pattern with large  volume retained stoolwithin the colon adjacent to the right abdominal anastomotic suture. 2. Cholelithiasis. 3. No other radiographic evidence for acute abnormality within theabdomen   LABS REVIEWED PREVIOUS:   10-01-17: wbc 11.7; hgb 12.5; hct 37.7; mcv 85.2; plt 321 10-22-17: glucose 143; bun 22; creat 0.88; k+ 4.4; na++ 132; ca 8.7 11-24-17: wbc 12.5; hgb 11.5; hct 33.8; mcv 85.8; plt 431; glucose 117; bun 26; creat 0.97; k+ 4.4; na++ 136; ca 8.6 liver normal albumin 2.9 urine culture: no growth  12-06-17: urine culture: aerococcus urinae  01-10-18: wbc 17.4; hgb 10.8; hct 34.4; mcv 82.1; plt 525 glucose 120; bun 18; creat 0.78; k+ 5.0; na++ 134; ca 8.5; liver normal albumin 3.2  01-11-18: wbc 10.0; hgb 9.7; hct 30.9; mcv 81.7; plt 464; glucose 98; bun 16; creat 0.79; k+ 3.9; na++ 135; ca 8.3; liver normal albumin 3.0   TODAY:   01-19-18: wbc 18.7; hgb 11.4; hct 37.3; mcv 81.8; plt 527; glucose: 116; bun 19; creat 0.91; k+ 4.5; na++ 134; ca 9.0; liver normal albumin 3.4 urine culture: enterococcus faecalis   Review of Systems  Unable to perform ROS: Dementia (unable to participate )    Physical Exam  Constitutional: She appears well-developed and well-nourished. No distress.  Neck: No thyromegaly present.  Cardiovascular: Normal rate, regular rhythm, normal heart sounds and intact distal pulses.  Pulmonary/Chest: Effort normal and breath sounds normal. No respiratory distress.  Abdominal: Soft. Bowel sounds are normal. She exhibits distension. There is no tenderness.  Bowel sounds hypoactive Is tympanic to percussion  Musculoskeletal: She exhibits no edema.  Is able to move all extremities   Lymphadenopathy:    She has no cervical adenopathy.  Neurological: She is alert.  Skin: Skin is warm and dry. She is not diaphoretic.  Psychiatric: She has a normal mood and affect.     ASSESSMENT/ PLAN:  TODAY;   1. UTI: will begin cipro 250 mg twice daily through 01-25-18 and will  monitor her status.     MD is aware of resident's narcotic use and is in agreement with current plan of care.  We will attempt to wean resident as apropriate   Ok Edwards NP [facility] Adult [facility]  Contact [phone] Monday through Friday 8am- 5pm  After hours call [phone]

## 2018-01-22 LAB — URINE CULTURE: Culture: >=100000 — AB

## 2018-01-23 NOTE — Progress Notes (Signed)
ED Antimicrobial Stewardship Positive Culture Follow Up   Beth Chen is an 84 y.o. female who presented to [facility] on 01/19/2018 with a chief complaint of  Chief Complaint  Patient presents with  . Nausea  . Diarrhea  . Emesis    Recent Results (from the past 720 hour(s))  MRSA PCR Screening     Status: None   Collection Time: 01/10/18  6:55 PM  Result Value Ref Range Status   MRSA by PCR NEGATIVE NEGATIVE Final    Comment:        The GeneXpert MRSA Assay (FDA approved for NASAL specimens only), is one component of a comprehensive MRSA colonization surveillance program. It is not intended to diagnose MRSA infection nor to guide or monitor treatment for MRSA infections. Performed at [facility], [address] [place], [place] [address]   Urine culture     Status: Abnormal   Collection Time: 01/19/18  3:57 PM  Result Value Ref Range Status   Specimen Description   Final    URINE, RANDOM Performed at [facility], [address] [place], [place] [address]    Special Requests   Final    NONE Performed at [facility], [address] [place],  [address]    Culture >=100,000 COLONIES/mL ENTEROCOCCUS FAECALIS (A)  Final   Report Status 01/22/2018 FINAL  Final   Organism ID, Bacteria ENTEROCOCCUS FAECALIS (A)  Final      Susceptibility   Enterococcus faecalis - MIC*    AMPICILLIN <=2 SENSITIVE Sensitive     LEVOFLOXACIN >=8 RESISTANT Resistant     NITROFURANTOIN <=16 SENSITIVE Sensitive     VANCOMYCIN 1 SENSITIVE Sensitive     * >=100,000 COLONIES/mL ENTEROCOCCUS FAECALIS    [x]  Patient discharged originally without antimicrobial agent and treatment is now indicated  New antibiotic prescription:    ED Provider:    11/24 Patient from [place]. Called RN on [place] and will fax culture results ([phone])   Nao Linz A 01/23/2018, 6:09 PM Clinical Pharmacist

## 2018-01-26 ENCOUNTER — Non-Acute Institutional Stay (SKILLED_NURSING_FACILITY): Payer: Medicare Other | Admitting: Adult Health

## 2018-01-26 ENCOUNTER — Encounter: Payer: Self-pay | Admitting: Adult Health

## 2018-01-26 ENCOUNTER — Other Ambulatory Visit: Payer: Self-pay | Admitting: Adult Health

## 2018-01-26 DIAGNOSIS — F0390 Unspecified dementia without behavioral disturbance: Secondary | ICD-10-CM | POA: Diagnosis not present

## 2018-01-26 DIAGNOSIS — K5652 Intestinal adhesions [bands] with complete obstruction: Secondary | ICD-10-CM | POA: Diagnosis not present

## 2018-01-26 DIAGNOSIS — J449 Chronic obstructive pulmonary disease, unspecified: Secondary | ICD-10-CM

## 2018-01-26 MED ORDER — HYDROMORPHONE HCL 2 MG PO TABS: 2.0000 mg | ORAL_TABLET | Freq: Four times a day (QID) | ORAL | 0 refills | Status: DC | PRN

## 2018-01-26 MED ORDER — LORAZEPAM 0.5 MG PO TABS: 0.5000 mg | ORAL_TABLET | Freq: Four times a day (QID) | ORAL | 0 refills | Status: DC | PRN

## 2018-01-26 NOTE — Progress Notes (Signed)
Location:   [facility] Room Number: [place] of Service:  SNF (31)   CODE STATUS: DNR  No Known Allergies  Chief Complaint  Patient presents with  . Acute Visit    Medication Management    HPI:  She is being followed by hospice care. She is unable to fully participate in the hpi or ros. Hospice reports that she is becoming agitated and is seeing people who are not there. There are no reports of fevers; her abdomen remains distended; and is having nausea. The hospice nurse and I have reviewed her medications and will reduce her pill load and focus her care on comfort only   Past Medical History:  Diagnosis Date  . Allergic rhinitis 11/18/2013  . Asthma with COPD (chronic obstructive pulmonary disease) ([place]) 11/18/2013   Last Assessment & Plan:  No flair ups noted recently and breathing is essentially at baseline.    . Asthma without status asthmaticus    unspecified  . Borderline hypothyroidism 11/18/2013   Last Assessment & Plan:  Energy and tsh are doing well  . Cancer [facility])    colon cancer- surgically removed in 2006  . Cholelithiases   . Chronic kidney disease   . Colon cancer ([place])   . Complete left bundle branch block (LBBB) 11/18/2013  . Constipation 11/18/2013  . Diverticulosis   . Hiatal hernia    right  . HLD (hyperlipidemia) 11/18/2013   Last Assessment & Plan:  Appropriate diet is followed and no myalgia's are noted.    . Hyperlipidemia, unspecified   . Hypertensive kidney disease with CKD stage III ([place]) 11/18/2013   Last Assessment & Plan:  Is compliant with hypertensive medications without clear side effects or lack of control.  Nausea and itching are not symptomatic and nsaids are being avoided.    . Melanoma ([place])   . Osteopenia     Past Surgical History:  Procedure Laterality Date  . ABDOMINAL HYSTERECTOMY    . Bladder Lift    . BREAST EXCISIONAL BIOPSY Left 15+ yrs ago   neg  . COLON RESECTION  2006   colon cancer  .  COLON SURGERY    . COLON SURGERY Right   . COLONOSCOPY WITH PROPOFOL N/A 06/01/2017   Procedure: COLONOSCOPY WITH PROPOFOL;  Surgeon: [name], MD;  Location: [facility] ENDOSCOPY;  Service: Endoscopy;  Laterality: N/A;  . ESOPHAGOGASTRODUODENOSCOPY (EGD) WITH PROPOFOL N/A 06/01/2017   Procedure: ESOPHAGOGASTRODUODENOSCOPY (EGD) WITH PROPOFOL;  Surgeon: [name], MD;  Location: [facility] ENDOSCOPY;  Service: Endoscopy;  Laterality: N/A;  . HEMICOLECTOMY Right   . SKIN CANCER EXCISION  2006   melanoma    Social History   Socioeconomic History  . Marital status: Widowed    Spouse name: Not on file  . Number of children: Not on file  . Years of education: 72  . Highest education level: Bachelor's degree (e.g., BA, AB, BS)  Occupational History  . 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 Financial resource strain: Not on file  . Food insecurity:    Worry: Not on file    Inability: Not on file  . Transportation needs:    Medical: Not on file    Non-medical: Not on file  Tobacco Use  . Smoking status: Never Smoker  . Smokeless tobacco: Never Used  Substance and Sexual Activity  . Alcohol use: No  . Drug use: Never  . Sexual activity: Not on file  Lifestyle  . Physical activity:  Days per week: Not on file    Minutes per session: Not on file  . Stress: Not on file  Relationships  . Social connections:    Talks on phone: Not on file    Gets together: Not on file    Attends religious service: Not on file    Active member of club or organization: Not on file    Attends meetings of clubs or organizations: Not on file    Relationship status: Not on file  . Intimate partner violence:    Fear of current or ex partner: Not on file    Emotionally abused: Not on file    Physically abused: Not on file    Forced sexual activity: Not on file  Other Topics Concern  . Not on file  Social History Narrative   From assisted living facility, however after recent discharge from the  hospital-discharge to short-term rehab   -Most transfers all through wheelchair      Non smoker   No smokeless tobacco use   No alcohol use   Widowed   DNR with HCPOA and Living Will    Family History  Problem Relation Age of Onset  . Breast cancer Daughter 10       passed at 54 from breast cancer  . Heart disease Mother   . Heart failure Mother   . Lung cancer Father       VITAL SIGNS BP 98/67   Pulse 69   Temp 98.1 F (36.7 C)   Resp 18   Ht 5' (1.524 m)   Wt 160 lb (72.6 kg)   SpO2 98%   BMI 31.25 kg/m   Outpatient Encounter Medications as of 01/26/2018  Medication Sig  . acetaminophen (TYLENOL) 325 MG tablet Take 650 mg by mouth every 4 (four) hours as needed for mild pain, moderate pain or fever.   . ATROPINE SULFATE PO Place 1 drop under the tongue every 6 (six) hours as needed.  . bisacodyl (DULCOLAX) 10 MG suppository Place 10 mg rectally 2 (two) times daily as needed for mild constipation or moderate constipation.   . calcium carbonate (TUMS EX) 750 MG chewable tablet Chew 2 tablets by mouth daily.   . diclofenac sodium (VOLTAREN) 1 % GEL Apply 4 g topically 4 (four) times daily. To right knee  . docusate sodium (COLACE) 100 MG capsule Take 1 capsule (100 mg total) by mouth 2 (two) times daily.  . fluticasone (FLONASE) 50 MCG/ACT nasal spray Place 2 sprays into both nostrils daily.   . fluticasone furoate-vilanterol (BREO ELLIPTA) 100-25 MCG/INH AEPB Inhale 1 puff into the lungs daily.   Marland Kitchen HYDROmorphone (DILAUDID) 2 MG tablet Take 1 tablet (2 mg total) by mouth every 6 (six) hours as needed for severe pain.  Marland Kitchen ipratropium-albuterol (DUONEB) 0.5-2.5 (3) MG/3ML SOLN Take 3 mLs by nebulization 3 (three) times daily.   Marland Kitchen lactulose (CHRONULAC) 10 GM/15ML solution Take 30 g by mouth 2 (two) times daily as needed for mild constipation or moderate constipation.   Marland Kitchen LORazepam (ATIVAN) 0.5 MG tablet Take 1 tablet (0.5 mg total) by mouth every 6 (six) hours as needed for  anxiety.  Marland Kitchen losartan (COZAAR) 25 MG tablet Take 25 mg by mouth daily as needed (BP >150/90).   . nystatin-triamcinolone (MYCOLOG II) cream Apply 1 application topically 2 (two) times daily. apply nystatin cream under the right breast to treat yeast infection/irritation  . ondansetron (ZOFRAN) 4 MG tablet Take 4 mg by mouth every  6 (six) hours as needed for nausea or vomiting.  . pantoprazole (PROTONIX) 40 MG tablet Take 40 mg by mouth daily before breakfast. before a meal to help with esophagus problems   . polyethylene glycol (MIRALAX / GLYCOLAX) packet Take 17 g by mouth at bedtime.   . Probiotic Product (RISA-BID PROBIOTIC) TABS Take 1 tablet by mouth 2 (two) times daily.  . psyllium (METAMUCIL) 0.52 g capsule Take 1 capsule (0.52 g total) by mouth daily.  Marland Kitchen senna (SENOKOT) 8.6 MG tablet Take 2 tablets by mouth at bedtime.  . sodium phosphate (FLEET) 7-19 GM/118ML ENEM Place 1 enema rectally every three (3) days as needed for mild constipation or moderate constipation. Once every 3 days as needed   . trimethoprim (TRIMPEX) 100 MG tablet Take 100 mg by mouth daily.    No facility-administered encounter medications on file as of 01/26/2018.      SIGNIFICANT DIAGNOSTIC EXAMS  PREVIOUS;  11-24-17: KUB: no specific bowel gas pattern with no definite acute intra-abdominal pathology.   12-06-17: ct of abdomen and pelvis Large right lateral hernia is noted in right lower quadrant of the abdomen which contains several loops of dilated small bowel, with dilatation of more proximal small bowel loops consistent with probable incarceration and obstruction. Moderate size sliding-type hiatal hernia. Sigmoid diverticulosis without inflammation. Mild urinary bladder distention. Cholelithiasis. Aortic Atherosclerosis   01-10-18: ct of abdomen and pelvis:  1. Prior right hemicolectomy. Right lateral wall hernia through which traverses small bowel, vessels and fat. The small bowel within the hernia  appears dilated and inflamed with surrounding fluid. 2. Marked amount of stool rectosigmoid region. Prominent diverticula throughout the colon without extraluminal inflammation of diverticula. 3. Small to slightly moderate-size hiatal hernia. 4. Gallstones. 5. 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 6. Remote L1 compression fracture with kyphosis centered at this level.  01-19-18: chest x-ray:  1. Mild left basilar atelectasis. No other active cardiopulmonary disease. 2. Chronic L1 compression deformity.  01-19-18: kub:  1. Nonobstructive bowel gas pattern with large volume retained stoolwithin the colon adjacent to the right abdominal anastomotic suture. 2. Cholelithiasis. 3. No other radiographic evidence for acute abnormality within the abdomen  NO NEW EXAMS.    LABS REVIEWED PREVIOUS:   10-01-17: wbc 11.7; hgb 12.5; hct 37.7; mcv 85.2; plt 321 10-22-17: glucose 143; bun 22; creat 0.88; k+ 4.4; na++ 132; ca 8.7 11-24-17: wbc 12.5; hgb 11.5; hct 33.8; mcv 85.8; plt 431; glucose 117; bun 26; creat 0.97; k+ 4.4; na++ 136; ca 8.6 liver normal albumin 2.9 urine culture: no growth  12-06-17: urine culture: aerococcus urinae  01-10-18: wbc 17.4; hgb 10.8; hct 34.4; mcv 82.1; plt 525 glucose 120; bun 18; creat 0.78; k+ 5.0; na++ 134; ca 8.5; liver normal albumin 3.2  01-11-18: wbc 10.0; hgb 9.7; hct 30.9; mcv 81.7; plt 464; glucose 98; bun 16; creat 0.79; k+ 3.9; na++ 135; ca 8.3; liver normal albumin 3.0  01-19-18: wbc 18.7; hgb 11.4; hct 37.3; mcv 81.8; plt 527; glucose: 116; bun 19; creat 0.91; k+ 4.5; na++ 134; ca 9.0; liver normal albumin 3.4 urine culture: enterococcus faecalis   NO NEW LABS.   Review of Systems  Unable to perform ROS: Dementia (unable to participate )  .  Physical Exam  Constitutional: She appears well-developed and well-nourished. No distress.  Neck: No thyromegaly present.  Cardiovascular: Normal rate, regular rhythm, normal heart  sounds and intact distal pulses.  Pulmonary/Chest: Effort normal and breath sounds normal. No  respiratory distress.  Abdominal: Soft. She exhibits distension. There is tenderness.  Bowel sounds hypoactive Is tympanic to percussion  Is slightly tender to palpation   Musculoskeletal: She exhibits no edema.  Is able to move all extremities   Lymphadenopathy:    She has no cervical adenopathy.  Neurological: She is alert.  Skin: Skin is warm and dry. She is not diaphoretic.  Psychiatric: She has a normal mood and affect.     ASSESSMENT/ PLAN:  TODAY:   1. Intestinal adhesions with complete obstruction 2. Asthma with copd 3. Dementia arising in the senium and presenium  Will stop the following medications: breo; cozaar; fiber; flonase; protonix and trimpex Will begin dilaudid 2 mg every 6 hours as needed zofran 4 mg every 6 hours Ativan 0.5 mg every 6 hours as needed for 14 days.     MD is aware of resident's narcotic use and is in agreement with current plan of care. We will attempt to wean resident as apropriate   Ok Edwards NP [facility]  Contact [phone] Monday through Friday 8am- 5pm  After hours call [phone]

## 2018-01-27 DIAGNOSIS — N390 Urinary tract infection, site not specified: Principal | ICD-10-CM

## 2018-01-27 DIAGNOSIS — B952 Enterococcus as the cause of diseases classified elsewhere: Secondary | ICD-10-CM | POA: Insufficient documentation

## 2018-01-31 ENCOUNTER — Other Ambulatory Visit: Payer: Self-pay | Admitting: Adult Health

## 2018-01-31 ENCOUNTER — Non-Acute Institutional Stay (SKILLED_NURSING_FACILITY): Payer: Medicare Other | Admitting: Adult Health

## 2018-01-31 DIAGNOSIS — F0390 Unspecified dementia without behavioral disturbance: Secondary | ICD-10-CM

## 2018-01-31 DIAGNOSIS — J449 Chronic obstructive pulmonary disease, unspecified: Secondary | ICD-10-CM | POA: Diagnosis not present

## 2018-01-31 DIAGNOSIS — K5652 Intestinal adhesions [bands] with complete obstruction: Secondary | ICD-10-CM | POA: Diagnosis not present

## 2018-01-31 DIAGNOSIS — J4489 Other specified chronic obstructive pulmonary disease: Secondary | ICD-10-CM

## 2018-01-31 MED ORDER — MORPHINE SULFATE (CONCENTRATE) 20 MG/ML PO SOLN: 10.0000 mg | ORAL | 0 refills | Status: DC | PRN

## 2018-02-01 ENCOUNTER — Encounter: Payer: Self-pay | Admitting: Adult Health

## 2018-02-01 NOTE — Progress Notes (Signed)
Location:   [place]    Place of Service:  SNF (31)   CODE STATUS: dnr  No Known Allergies  Chief Complaint  Patient presents with  . Acute Visit    patient status     HPI:  She continues to decline; her abdomen is distended; her bowel sounds are diminished and sluggish. She is declining medications; is paranoid at times; thinking people are out to get her. She has had a small bm. Her appetite is very poor with poor fluid intake. There are no reports of nausea or vomiting. There are no reports of fevers. Hospice has asked that review her medications and change her dilaudid to roxanol due to ease of taking for the patient.    Past Medical History:  Diagnosis Date  . Allergic rhinitis 11/18/2013  . Asthma with COPD (chronic obstructive pulmonary disease) ([place]) 11/18/2013   Last Assessment & Plan:  No flair ups noted recently and breathing is essentially at baseline.    . Asthma without status asthmaticus    unspecified  . Borderline hypothyroidism 11/18/2013   Last Assessment & Plan:  Energy and tsh are doing well  . Cancer [facility])    colon cancer- surgically removed in 2006  . Cholelithiases   . Chronic kidney disease   . Colon cancer ([place])   . Complete left bundle branch block (LBBB) 11/18/2013  . Constipation 11/18/2013  . Diverticulosis   . Hiatal hernia    right  . HLD (hyperlipidemia) 11/18/2013   Last Assessment & Plan:  Appropriate diet is followed and no myalgia's are noted.    . Hyperlipidemia, unspecified   . Hypertensive kidney disease with CKD stage III ([place]) 11/18/2013   Last Assessment & Plan:  Is compliant with hypertensive medications without clear side effects or lack of control.  Nausea and itching are not symptomatic and nsaids are being avoided.    . Melanoma ([place])   . Osteopenia     Past Surgical History:  Procedure Laterality Date  . ABDOMINAL HYSTERECTOMY    . Bladder Lift    . BREAST EXCISIONAL BIOPSY Left 15+ yrs ago   neg  . COLON RESECTION   2006   colon cancer  . COLON SURGERY    . COLON SURGERY Right   . COLONOSCOPY WITH PROPOFOL N/A 06/01/2017   Procedure: COLONOSCOPY WITH PROPOFOL;  Surgeon: [name], MD;  Location: [facility] ENDOSCOPY;  Service: Endoscopy;  Laterality: N/A;  . ESOPHAGOGASTRODUODENOSCOPY (EGD) WITH PROPOFOL N/A 06/01/2017   Procedure: ESOPHAGOGASTRODUODENOSCOPY (EGD) WITH PROPOFOL;  Surgeon: [name], MD;  Location: [facility] ENDOSCOPY;  Service: Endoscopy;  Laterality: N/A;  . HEMICOLECTOMY Right   . SKIN CANCER EXCISION  2006   melanoma    Social History   Socioeconomic History  . Marital status: Widowed    Spouse name: Not on file  . Number of children: Not on file  . Years of education: 66  . Highest education level: Bachelor's degree (e.g., BA, AB, BS)  Occupational History  . 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 Financial resource strain: Not on file  . Food insecurity:    Worry: Not on file    Inability: Not on file  . Transportation needs:    Medical: Not on file    Non-medical: Not on file  Tobacco Use  . Smoking status: Never Smoker  . Smokeless tobacco: Never Used  Substance and Sexual Activity  . Alcohol use: No  . Drug use: Never  . Sexual activity: Not  on file  Lifestyle  . Physical activity:    Days per week: Not on file    Minutes per session: Not on file  . Stress: Not on file  Relationships  . Social connections:    Talks on phone: Not on file    Gets together: Not on file    Attends religious service: Not on file    Active member of club or organization: Not on file    Attends meetings of clubs or organizations: Not on file    Relationship status: Not on file  . Intimate partner violence:    Fear of current or ex partner: Not on file    Emotionally abused: Not on file    Physically abused: Not on file    Forced sexual activity: Not on file  Other Topics Concern  . Not on file  Social History Narrative   From assisted living facility, however after recent  discharge from the hospital-discharge to short-term rehab   -Most transfers all through wheelchair      Non smoker   No smokeless tobacco use   No alcohol use   Widowed   DNR with HCPOA and Living Will    Family History  Problem Relation Age of Onset  . Breast cancer Daughter 60       passed at 19 from breast cancer  . Heart disease Mother   . Heart failure Mother   . Lung cancer Father       VITAL SIGNS BP 132/65   Pulse 72   Temp (!) 97.5 F (36.4 C)   Resp 16   Ht 5' (1.524 m)   Wt 140 lb (63.5 kg)   SpO2 95%   BMI 27.34 kg/m   Outpatient Encounter Medications as of 01/31/2018  Medication Sig  . ondansetron (ZOFRAN) 4 MG tablet Take 4 mg by mouth every 6 (six) hours.   Marland Kitchen acetaminophen (TYLENOL) 325 MG tablet Take 650 mg by mouth every 4 (four) hours as needed for mild pain, moderate pain or fever.   . ATROPINE SULFATE PO Place 1 drop under the tongue every 6 (six) hours as needed.  . bisacodyl (DULCOLAX) 10 MG suppository Place 10 mg rectally 2 (two) times daily as needed for mild constipation or moderate constipation.   . calcium carbonate (TUMS EX) 750 MG chewable tablet Chew 2 tablets by mouth daily.   . diclofenac sodium (VOLTAREN) 1 % GEL Apply 4 g topically 4 (four) times daily. To right knee  . docusate sodium (COLACE) 100 MG capsule Take 1 capsule (100 mg total) by mouth 2 (two) times daily.  Marland Kitchen HYDROmorphone (DILAUDID) 2 MG tablet Take 1 tablet (2 mg total) by mouth every 6 (six) hours as needed for severe pain.  Marland Kitchen ipratropium-albuterol (DUONEB) 0.5-2.5 (3) MG/3ML SOLN Take 3 mLs by nebulization 3 (three) times daily.   Marland Kitchen lactulose (CHRONULAC) 10 GM/15ML solution Take 30 g by mouth 2 (two) times daily as needed for mild constipation or moderate constipation.   Marland Kitchen LORazepam (ATIVAN) 0.5 MG tablet Take 1 tablet (0.5 mg total) by mouth every 6 (six) hours as needed for anxiety.  Marland Kitchen nystatin-triamcinolone (MYCOLOG II) cream Apply 1 application topically 2 (two) times  daily. apply nystatin cream under the right breast to treat yeast infection/irritation  . polyethylene glycol (MIRALAX / GLYCOLAX) packet Take 17 g by mouth at bedtime.   . senna (SENOKOT) 8.6 MG tablet Take 2 tablets by mouth at bedtime.  . sodium phosphate (FLEET)  7-19 GM/118ML ENEM Place 1 enema rectally every three (3) days as needed for mild constipation or moderate constipation. Once every 3 days as needed   . [DISCONTINUED] fluticasone (FLONASE) 50 MCG/ACT nasal spray Place 2 sprays into both nostrils daily.   . [DISCONTINUED] fluticasone furoate-vilanterol (BREO ELLIPTA) 100-25 MCG/INH AEPB Inhale 1 puff into the lungs daily.   . [DISCONTINUED] losartan (COZAAR) 25 MG tablet Take 25 mg by mouth daily as needed (BP >150/90).   . [DISCONTINUED] pantoprazole (PROTONIX) 40 MG tablet Take 40 mg by mouth daily before breakfast. before a meal to help with esophagus problems   . [DISCONTINUED] Probiotic Product (RISA-BID PROBIOTIC) TABS Take 1 tablet by mouth 2 (two) times daily.  . [DISCONTINUED] psyllium (METAMUCIL) 0.52 g capsule Take 1 capsule (0.52 g total) by mouth daily.  . [DISCONTINUED] trimethoprim (TRIMPEX) 100 MG tablet Take 100 mg by mouth daily.    No facility-administered encounter medications on file as of 01/31/2018.      SIGNIFICANT DIAGNOSTIC EXAMS   PREVIOUS;  11-24-17: KUB: no specific bowel gas pattern with no definite acute intra-abdominal pathology.   12-06-17: ct of abdomen and pelvis Large right lateral hernia is noted in right lower quadrant of the abdomen which contains several loops of dilated small bowel, with dilatation of more proximal small bowel loops consistent with probable incarceration and obstruction. Moderate size sliding-type hiatal hernia. Sigmoid diverticulosis without inflammation. Mild urinary bladder distention. Cholelithiasis. Aortic Atherosclerosis   01-10-18: ct of abdomen and pelvis:  1. Prior right hemicolectomy. Right lateral wall  hernia through which traverses small bowel, vessels and fat. The small bowel within the hernia appears dilated and inflamed with surrounding fluid. 2. Marked amount of stool rectosigmoid region. Prominent diverticula throughout the colon without extraluminal inflammation of diverticula. 3. Small to slightly moderate-size hiatal hernia. 4. Gallstones. 5. 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 6. Remote L1 compression fracture with kyphosis centered at this level.  01-19-18: chest x-ray:  1. Mild left basilar atelectasis. No other active cardiopulmonary disease. 2. Chronic L1 compression deformity.  01-19-18: kub:  1. Nonobstructive bowel gas pattern with large volume retained stoolwithin the colon adjacent to the right abdominal anastomotic suture. 2. Cholelithiasis. 3. No other radiographic evidence for acute abnormality within the abdomen  NO NEW EXAMS.    LABS REVIEWED PREVIOUS:   10-01-17: wbc 11.7; hgb 12.5; hct 37.7; mcv 85.2; plt 321 10-22-17: glucose 143; bun 22; creat 0.88; k+ 4.4; na++ 132; ca 8.7 11-24-17: wbc 12.5; hgb 11.5; hct 33.8; mcv 85.8; plt 431; glucose 117; bun 26; creat 0.97; k+ 4.4; na++ 136; ca 8.6 liver normal albumin 2.9 urine culture: no growth  12-06-17: urine culture: aerococcus urinae  01-10-18: wbc 17.4; hgb 10.8; hct 34.4; mcv 82.1; plt 525 glucose 120; bun 18; creat 0.78; k+ 5.0; na++ 134; ca 8.5; liver normal albumin 3.2  01-11-18: wbc 10.0; hgb 9.7; hct 30.9; mcv 81.7; plt 464; glucose 98; bun 16; creat 0.79; k+ 3.9; na++ 135; ca 8.3; liver normal albumin 3.0  01-19-18: wbc 18.7; hgb 11.4; hct 37.3; mcv 81.8; plt 527; glucose: 116; bun 19; creat 0.91; k+ 4.5; na++ 134; ca 9.0; liver normal albumin 3.4 urine culture: enterococcus faecalis   NO NEW LABS.   Review of Systems  Unable to perform ROS: Dementia (unable to participate )   Physical Exam  Constitutional: No distress.  frail  Neck: No thyromegaly present.    Cardiovascular: Normal rate, regular rhythm, normal heart sounds and  intact distal pulses.  Pulmonary/Chest: Effort normal and breath sounds normal. No respiratory distress.  Abdominal: She exhibits distension. There is tenderness.  Her abdomen is significantly distended and is firm; with bowel sounds soft and very sluggish. There appears to be generalized tenderness   Musculoskeletal: She exhibits no edema.  Is able to move all extremities   Lymphadenopathy:    She has no cervical adenopathy.  Neurological:  Is aware   Skin: Skin is warm and dry. She is not diaphoretic.  Psychiatric:  Is easily upset      ASSESSMENT/ PLAN:  TODAY:   1. Intestinal adhesions with complete obstruction 2. Asthma with copd 3. Dementia arising in the senium and presenium  Will stop dilaudid  Will begin roxanol 10 mg every hour as needed  Will continue to monitor her status and will focus upon comfort.    MD is aware of resident's narcotic use and is in agreement with current plan of care. We will attempt to wean resident as apropriate   Ok Edwards NP [facility]  Contact [phone] Monday through Friday 8am- 5pm  After hours call [phone]

## 2018-02-02 ENCOUNTER — Other Ambulatory Visit: Payer: Self-pay | Admitting: Adult Health

## 2018-02-02 MED ORDER — MORPHINE SULFATE (CONCENTRATE) 20 MG/ML PO SOLN: 10.0000 mg | ORAL | 0 refills | Status: AC | PRN

## 2018-02-03 ENCOUNTER — Encounter: Payer: Self-pay | Admitting: Adult Health

## 2018-02-03 ENCOUNTER — Non-Acute Institutional Stay (SKILLED_NURSING_FACILITY): Payer: Medicare Other | Admitting: Adult Health

## 2018-02-03 DIAGNOSIS — K5652 Intestinal adhesions [bands] with complete obstruction: Secondary | ICD-10-CM | POA: Diagnosis not present

## 2018-02-03 DIAGNOSIS — J449 Chronic obstructive pulmonary disease, unspecified: Secondary | ICD-10-CM

## 2018-02-03 DIAGNOSIS — F0390 Unspecified dementia without behavioral disturbance: Secondary | ICD-10-CM | POA: Diagnosis not present

## 2018-02-03 NOTE — Progress Notes (Signed)
Location:   [facility] [place] [facility] [place] Room Number: [phone] Place of Service:  SNF (31)   CODE STATUS: DNR  No Known Allergies  Chief Complaint  Patient presents with  . Acute Visit    Patient Status    HPI:  She is more confused when family is not present. She is holding her medications in her mouth; and does have excessive saliva present. Her abdomen remains distended; she denies any nausea or vomiting. She is some what disoriented. She denies uncontrolled pain. She does have family present. She continues to followed by hospice care.   Past Medical History:  Diagnosis Date  . Allergic rhinitis 11/18/2013  . Asthma with COPD (chronic obstructive pulmonary disease) ([place]) 11/18/2013   Last Assessment & Plan:  No flair ups noted recently and breathing is essentially at baseline.    . Asthma without status asthmaticus    unspecified  . Borderline hypothyroidism 11/18/2013   Last Assessment & Plan:  Energy and tsh are doing well  . Cancer [facility])    colon cancer- surgically removed in 2006  . Cholelithiases   . Chronic kidney disease   . Colon cancer ([place])   . Complete left bundle branch block (LBBB) 11/18/2013  . Constipation 11/18/2013  . Diverticulosis   . Hiatal hernia    right  . HLD (hyperlipidemia) 11/18/2013   Last Assessment & Plan:  Appropriate diet is followed and no myalgia's are noted.    . Hyperlipidemia, unspecified   . Hypertensive kidney disease with CKD stage III ([place]) 11/18/2013   Last Assessment & Plan:  Is compliant with hypertensive medications without clear side effects or lack of control.  Nausea and itching are not symptomatic and nsaids are being avoided.    . Melanoma ([place])   . Osteopenia     Past Surgical History:  Procedure Laterality Date  . ABDOMINAL HYSTERECTOMY    . Bladder Lift    . BREAST EXCISIONAL BIOPSY Left 15+ yrs ago   neg  . COLON RESECTION  2006   colon cancer  . COLON SURGERY    . COLON SURGERY Right   . COLONOSCOPY  WITH PROPOFOL N/A 06/01/2017   Procedure: COLONOSCOPY WITH PROPOFOL;  Surgeon: [name], MD;  Location: [facility] ENDOSCOPY;  Service: Endoscopy;  Laterality: N/A;  . ESOPHAGOGASTRODUODENOSCOPY (EGD) WITH PROPOFOL N/A 06/01/2017   Procedure: ESOPHAGOGASTRODUODENOSCOPY (EGD) WITH PROPOFOL;  Surgeon: [name], MD;  Location: [facility] ENDOSCOPY;  Service: Endoscopy;  Laterality: N/A;  . HEMICOLECTOMY Right   . SKIN CANCER EXCISION  2006   melanoma    Social History   Socioeconomic History  . Marital status: Widowed    Spouse name: Not on file  . Number of children: Not on file  . Years of education: 70  . Highest education level: Bachelor's degree (e.g., BA, AB, BS)  Occupational History  . 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 Financial resource strain: Not on file  . Food insecurity:    Worry: Not on file    Inability: Not on file  . Transportation needs:    Medical: Not on file    Non-medical: Not on file  Tobacco Use  . Smoking status: Never Smoker  . Smokeless tobacco: Never Used  Substance and Sexual Activity  . Alcohol use: No  . Drug use: Never  . Sexual activity: Not on file  Lifestyle  . Physical activity:    Days per week: Not on file    Minutes per session: Not  on file  . Stress: Not on file  Relationships  . Social connections:    Talks on phone: Not on file    Gets together: Not on file    Attends religious service: Not on file    Active member of club or organization: Not on file    Attends meetings of clubs or organizations: Not on file    Relationship status: Not on file  . Intimate partner violence:    Fear of current or ex partner: Not on file    Emotionally abused: Not on file    Physically abused: Not on file    Forced sexual activity: Not on file  Other Topics Concern  . Not on file  Social History Narrative   From assisted living facility, however after recent discharge from the hospital-discharge to short-term rehab   -Most transfers all through  wheelchair      Non smoker   No smokeless tobacco use   No alcohol use   Widowed   DNR with HCPOA and Living Will    Family History  Problem Relation Age of Onset  . Breast cancer Daughter 66       passed at 50 from breast cancer  . Heart disease Mother   . Heart failure Mother   . Lung cancer Father       VITAL SIGNS BP 132/65   Pulse 72   Temp (!) 97.5 F (36.4 C) (Oral)   Resp 18   Ht 5' (1.524 m)   Wt 149 lb (67.6 kg)   SpO2 95%   BMI 29.10 kg/m   Outpatient Encounter Medications as of 02/03/2018  Medication Sig  . acetaminophen (TYLENOL) 325 MG tablet Take 650 mg by mouth every 4 (four) hours as needed for mild pain, moderate pain or fever.   . ATROPINE SULFATE PO Place 1 drop under the tongue every 6 (six) hours as needed.  . bisacodyl (DULCOLAX) 10 MG suppository Place 10 mg rectally 2 (two) times daily as needed for mild constipation or moderate constipation.   . diclofenac sodium (VOLTAREN) 1 % GEL Apply 4 g topically 4 (four) times daily. To right knee  . docusate sodium (COLACE) 100 MG capsule Take 1 capsule (100 mg total) by mouth 2 (two) times daily.  Marland Kitchen ipratropium-albuterol (DUONEB) 0.5-2.5 (3) MG/3ML SOLN Take 3 mLs by nebulization 3 (three) times daily.   Marland Kitchen lactulose (CHRONULAC) 10 GM/15ML solution Take 30 g by mouth 2 (two) times daily as needed for mild constipation or moderate constipation.   Marland Kitchen LORazepam (ATIVAN) 0.5 MG tablet Take 0.5 mg by mouth every 6 (six) hours as needed for anxiety.  Marland Kitchen morphine (ROXANOL) 20 MG/ML concentrated solution Take 0.5 mLs (10 mg total) by mouth every hour as needed for severe pain.  Marland Kitchen nystatin-triamcinolone (MYCOLOG II) cream Apply 1 application topically 2 (two) times daily. apply nystatin cream under the right breast to treat yeast infection/irritation  . ondansetron (ZOFRAN) 4 MG tablet Take 4 mg by mouth every 6 (six) hours.   . polyethylene glycol (MIRALAX / GLYCOLAX) packet Take 17 g by mouth at bedtime.   . senna  (SENOKOT) 8.6 MG tablet Take 2 tablets by mouth at bedtime.  . sodium phosphate (FLEET) 7-19 GM/118ML ENEM Place 1 enema rectally every three (3) days as needed for mild constipation or moderate constipation. Once every 3 days as needed   . [DISCONTINUED] calcium carbonate (TUMS EX) 750 MG chewable tablet Chew 2 tablets by mouth daily.   . [  DISCONTINUED] HYDROmorphone (DILAUDID) 2 MG tablet Take 1 tablet (2 mg total) by mouth every 6 (six) hours as needed for severe pain.  . [DISCONTINUED] LORazepam (ATIVAN) 0.5 MG tablet Take 1 tablet (0.5 mg total) by mouth every 6 (six) hours as needed for anxiety.   No facility-administered encounter medications on file as of 02/03/2018.      SIGNIFICANT DIAGNOSTIC EXAMS  PREVIOUS;  11-24-17: KUB: no specific bowel gas pattern with no definite acute intra-abdominal pathology.   12-06-17: ct of abdomen and pelvis Large right lateral hernia is noted in right lower quadrant of the abdomen which contains several loops of dilated small bowel, with dilatation of more proximal small bowel loops consistent with probable incarceration and obstruction. Moderate size sliding-type hiatal hernia. Sigmoid diverticulosis without inflammation. Mild urinary bladder distention. Cholelithiasis. Aortic Atherosclerosis   01-10-18: ct of abdomen and pelvis:  1. Prior right hemicolectomy. Right lateral wall hernia through which traverses small bowel, vessels and fat. The small bowel within the hernia appears dilated and inflamed with surrounding fluid. 2. Marked amount of stool rectosigmoid region. Prominent diverticula throughout the colon without extraluminal inflammation of diverticula. 3. Small to slightly moderate-size hiatal hernia. 4. Gallstones. 5. 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 6. Remote L1 compression fracture with kyphosis centered at this level.  01-19-18: chest x-ray:  1. Mild left basilar atelectasis. No other active  cardiopulmonary disease. 2. Chronic L1 compression deformity.  01-19-18: kub:  1. Nonobstructive bowel gas pattern with large volume retained stoolwithin the colon adjacent to the right abdominal anastomotic suture. 2. Cholelithiasis. 3. No other radiographic evidence for acute abnormality within the abdomen  NO NEW EXAMS.    LABS REVIEWED PREVIOUS:   10-01-17: wbc 11.7; hgb 12.5; hct 37.7; mcv 85.2; plt 321 10-22-17: glucose 143; bun 22; creat 0.88; k+ 4.4; na++ 132; ca 8.7 11-24-17: wbc 12.5; hgb 11.5; hct 33.8; mcv 85.8; plt 431; glucose 117; bun 26; creat 0.97; k+ 4.4; na++ 136; ca 8.6 liver normal albumin 2.9 urine culture: no growth  12-06-17: urine culture: aerococcus urinae  01-10-18: wbc 17.4; hgb 10.8; hct 34.4; mcv 82.1; plt 525 glucose 120; bun 18; creat 0.78; k+ 5.0; na++ 134; ca 8.5; liver normal albumin 3.2  01-11-18: wbc 10.0; hgb 9.7; hct 30.9; mcv 81.7; plt 464; glucose 98; bun 16; creat 0.79; k+ 3.9; na++ 135; ca 8.3; liver normal albumin 3.0  01-19-18: wbc 18.7; hgb 11.4; hct 37.3; mcv 81.8; plt 527; glucose: 116; bun 19; creat 0.91; k+ 4.5; na++ 134; ca 9.0; liver normal albumin 3.4 urine culture: enterococcus faecalis   NO NEW LABS.   Review of Systems  Unable to perform ROS: Dementia (unable to participate)   Physical Exam  Constitutional: No distress.  Frail   Neck: No thyromegaly present.  Cardiovascular: Normal rate, regular rhythm, normal heart sounds and intact distal pulses.  Pulmonary/Chest: Effort normal and breath sounds normal. No respiratory distress.  Abdominal: She exhibits distension. There is tenderness.  Her abdomen is significantly distended and is firm; with bowel sounds soft and very sluggish. There appears to be generalized tenderness    Musculoskeletal: She exhibits no edema.  Is able to move all extremities   Lymphadenopathy:    She has no cervical adenopathy.  Neurological: She is alert.  Skin: Skin is warm and dry. She is not  diaphoretic.  Psychiatric: She has a normal mood and affect.      ASSESSMENT/ PLAN:  TODAY:   1. Intestinal adhesions  with complete obstruction 2. Asthma with copd 3. Dementia arising in the senium and presenium  Will change her atropine drop every 6 hours routinely  Will continue to focus her care upon comfort      MD is aware of resident's narcotic use and is in agreement with current plan of care. We will attempt to wean resident as apropriate   Ok Edwards NP [facility]  Contact [phone] Monday through Friday 8am- 5pm  After hours call [phone]

## 2018-03-02 DEATH — deceased

## 2018-03-21 ENCOUNTER — Ambulatory Visit: Payer: Medicare Other | Admitting: Urology

## 2018-12-24 IMAGING — CR DG THORACIC SPINE 2V
1 series · 2 of 2 positions shown · non-contrast
Comparison: Chest x-ray 05/30/2017

CLINICAL DATA: Fall with back pain

EXAM:
THORACIC SPINE 2 VIEWS

[Series 1: dg thoracic spine 2 view · 0.14mm/px · 2 of 2 slices shown]
[im 1/2]
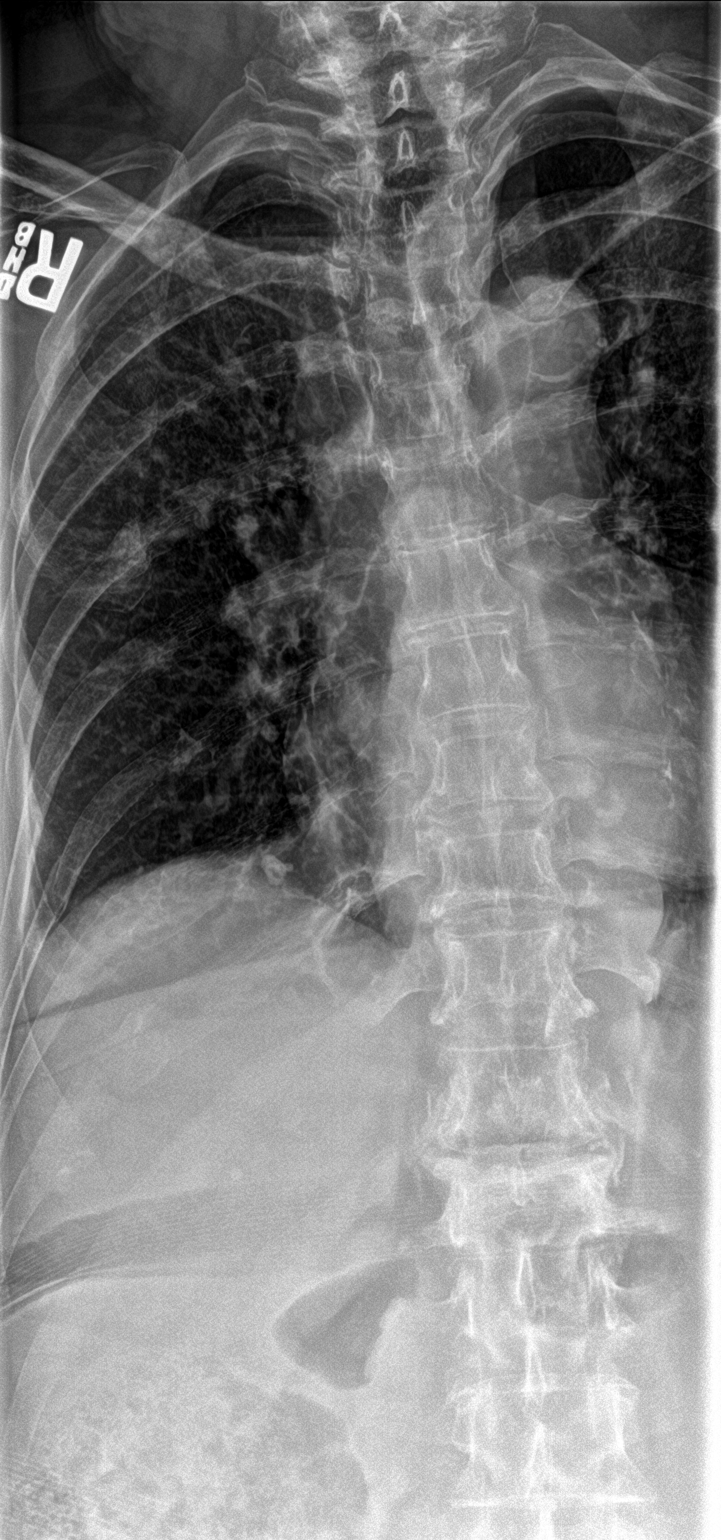
[im 2/2]
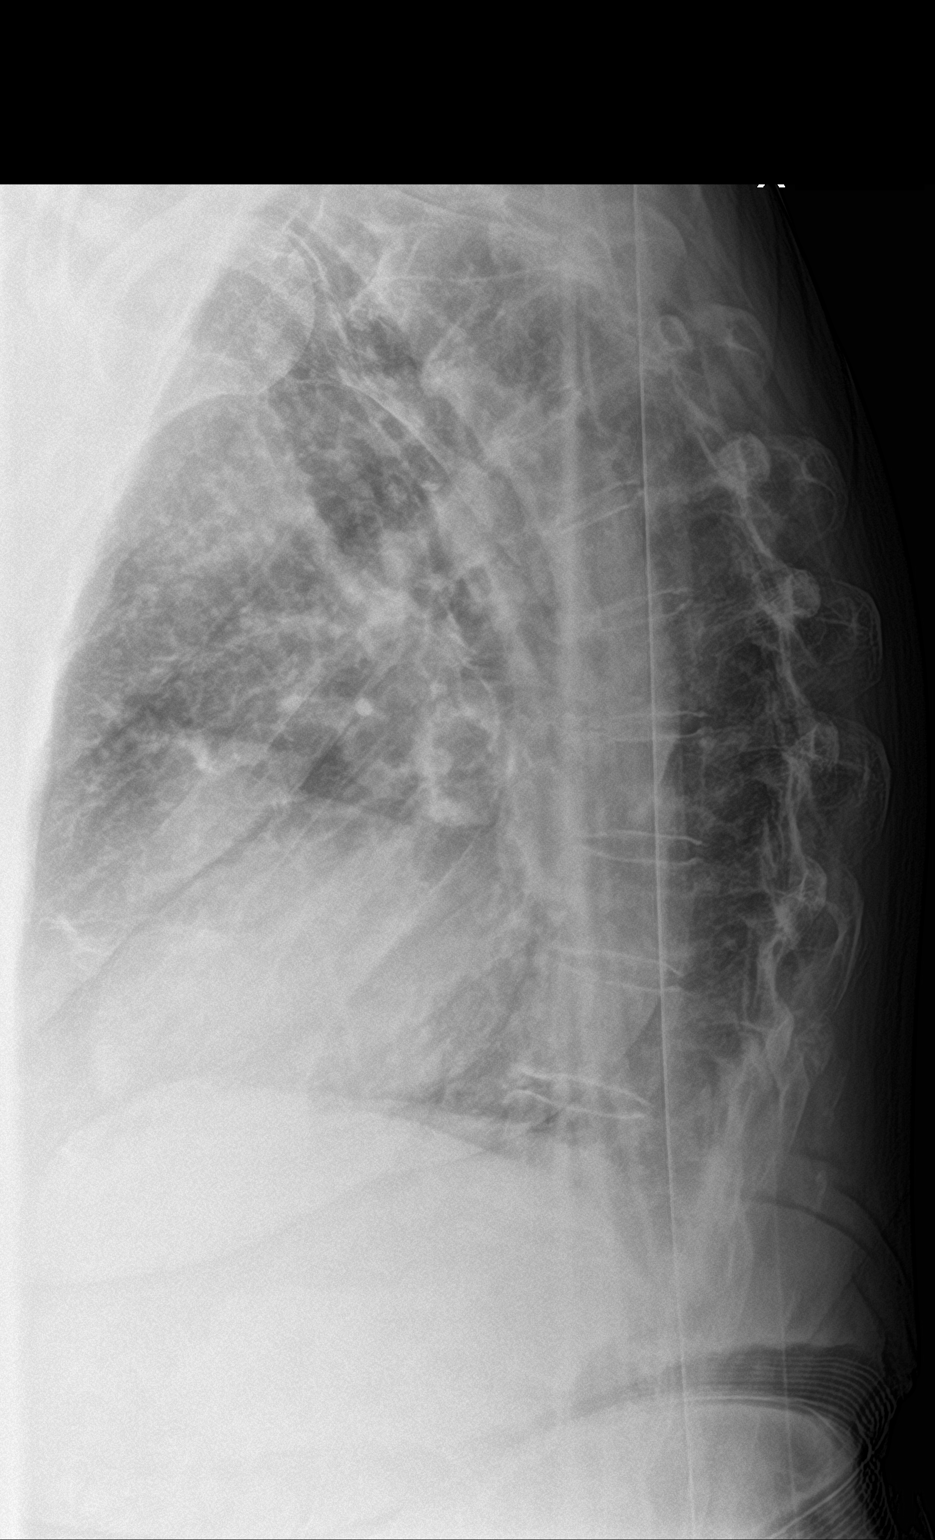

[2 of 2 positions shown; findings below may reference images not displayed]

FINDINGS: Mild scoliosis of the spine. Thoracic alignment otherwise within
normal limits. Imaged vertebral bodies are maintained. Mild
degenerative osteophytes.
IMPRESSION: Mild scoliosis. Otherwise no acute osseous abnormality. See
separately dictated lumbar spine report

## 2019-05-19 IMAGING — DX DG CHEST 1V PORT
1 series · 1 of 1 positions shown · non-contrast
Comparison: Radiographs 07/13/2017

CLINICAL DATA: Wheezing.  Vomiting.

EXAM:
PORTABLE CHEST 1 VIEW

[chest ap]
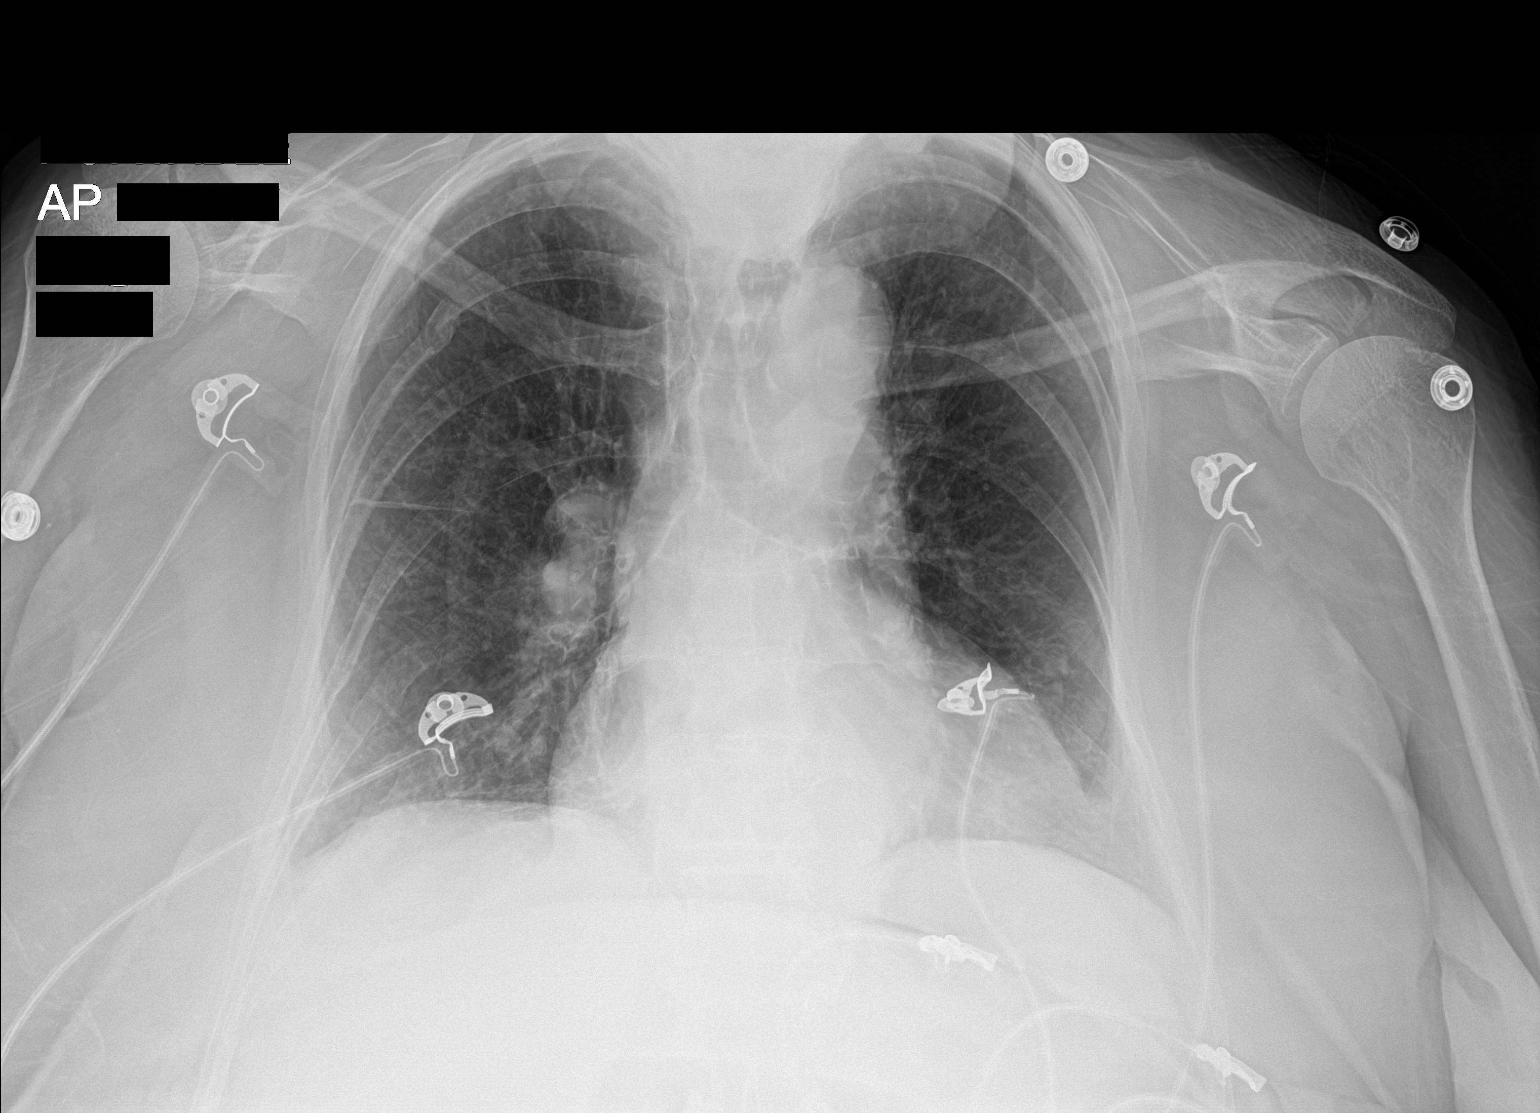

[1 of 1 positions shown; findings below may reference images not displayed]

FINDINGS: Cardiomediastinal contours are unchanged with mild cardiomegaly and
retrocardiac hiatal hernia. Coarse lung markings appear chronic.
Pulmonary vasculature is normal. No consolidation, pleural effusion,
or pneumothorax. No acute osseous abnormalities are seen. Remote
right rib fractures.
IMPRESSION: No acute findings.

## 2019-05-19 IMAGING — CT CT ABD-PELV W/ CM
3 of 6 series · 15 of 46 positions shown, 17 images · IV contrast (APPLIED)
Comparison: CT scan of April 14, 2017.

CLINICAL DATA: Acute right-sided abdominal pain.

EXAM:
CT ABDOMEN AND PELVIS WITH CONTRAST
TECHNIQUE: Multidetector CT imaging of the abdomen and pelvis was performed
using the standard protocol following bolus administration of
intravenous contrast.
CONTRAST:  75mL OMNIPAQUE IOHEXOL 300 MG/ML  SOLN

[Series 2: routine abd/pel with · axial · 0.70mm/px · z∈[-444,-104]mm · 10 of 84 slices shown, 12 images]
[im 8/84  soft-tissue]
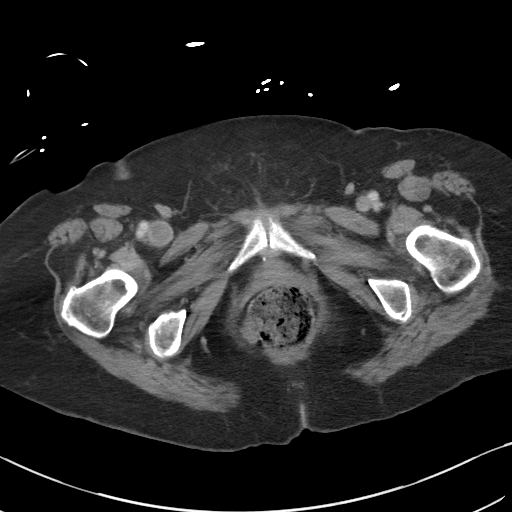
[im 8/84  bone]
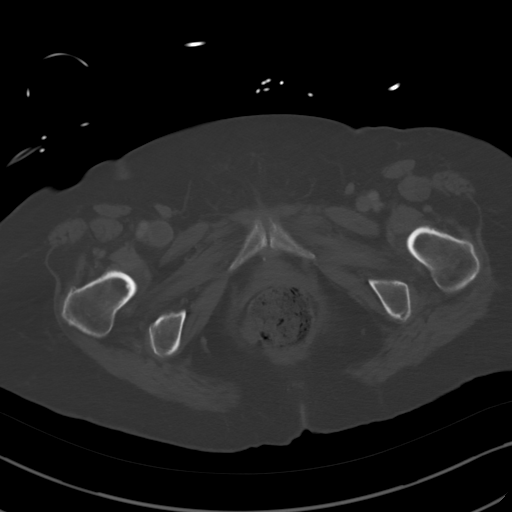
[im 16/84  soft-tissue]
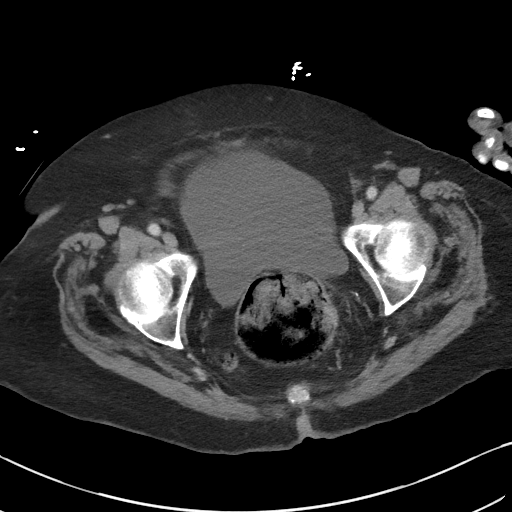
[im 23/84  soft-tissue]
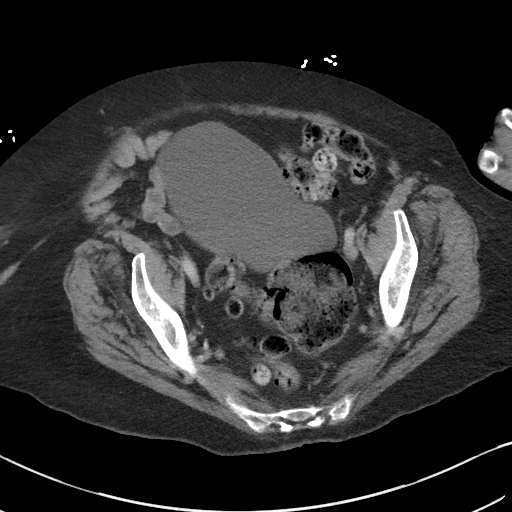
[im 31/84  soft-tissue]
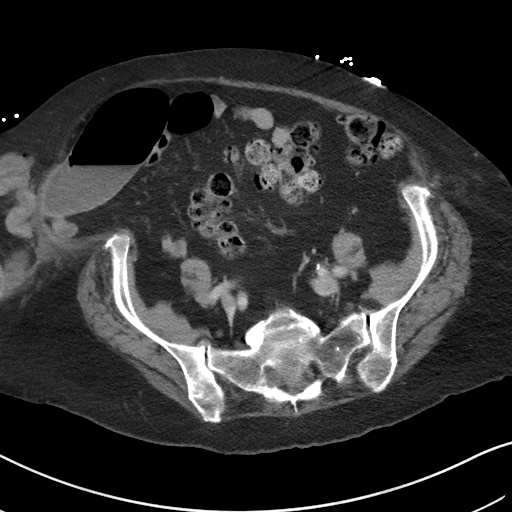
[im 38/84  soft-tissue]
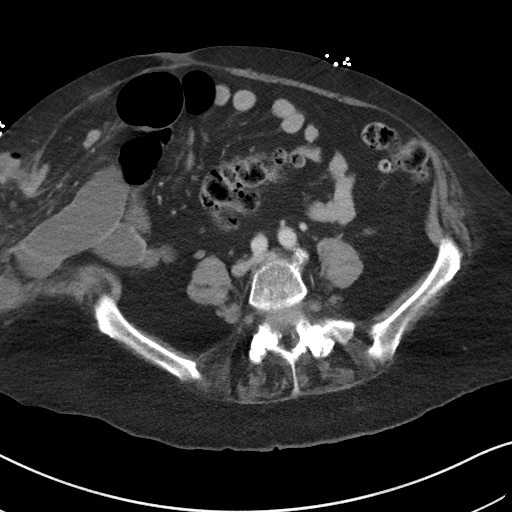
[im 46/84  soft-tissue]
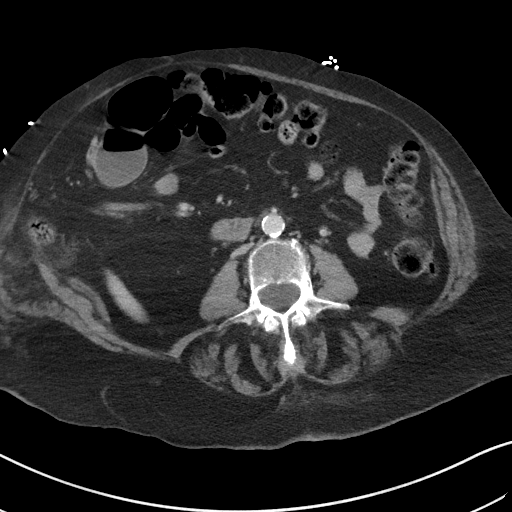
[im 53/84  soft-tissue]
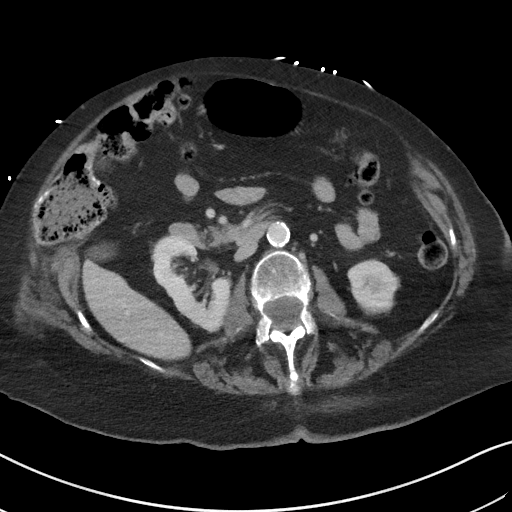
[im 61/84  soft-tissue]
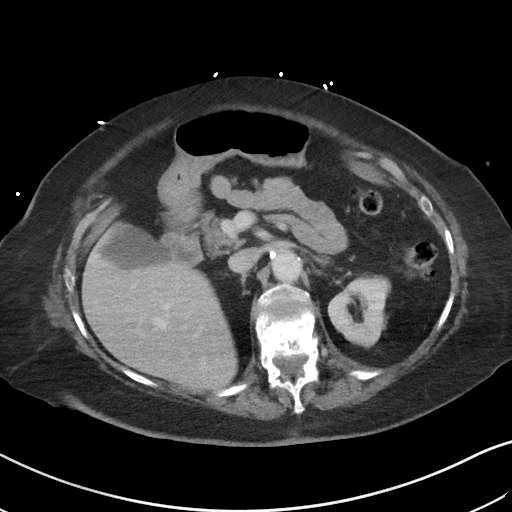
[im 68/84  soft-tissue]
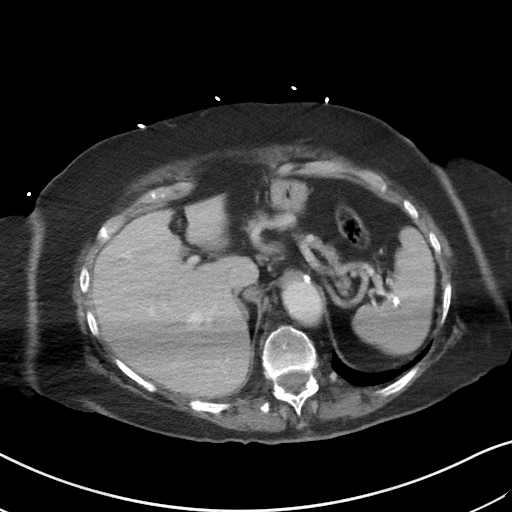
[im 68/84  bone]
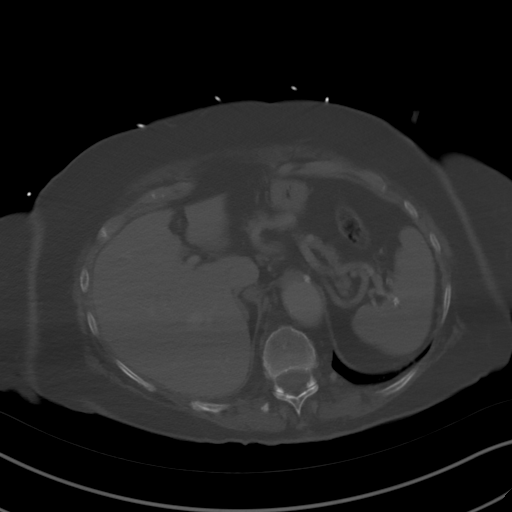
[im 76/84  soft-tissue]
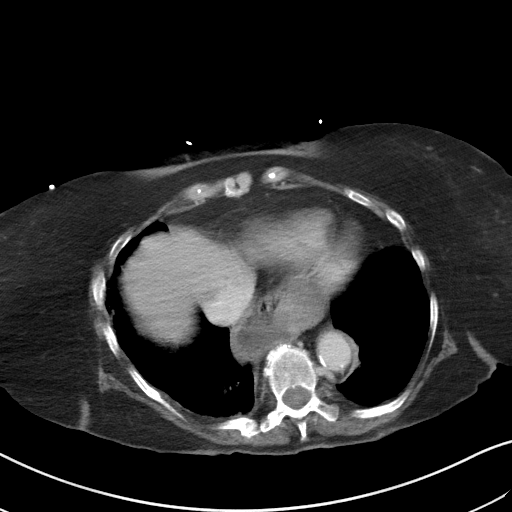

[Series 4: lung · axial · 0.70mm/px · z∈[-144,-104]mm · 2 of 24 slices shown]
[im 8/24  bone]
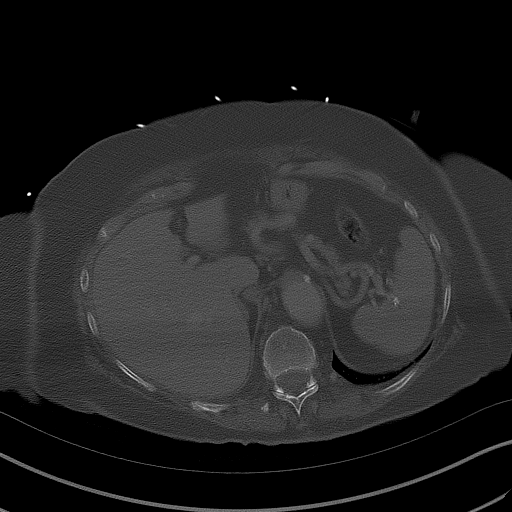
[im 16/24  bone]
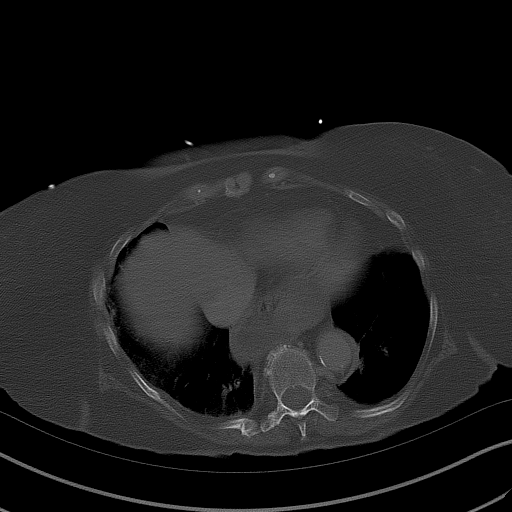

[Series 7: abd/pel with cor · coronal · 0.84mm/px · 3 of 62 slices shown]
[im 21/62  soft-tissue]
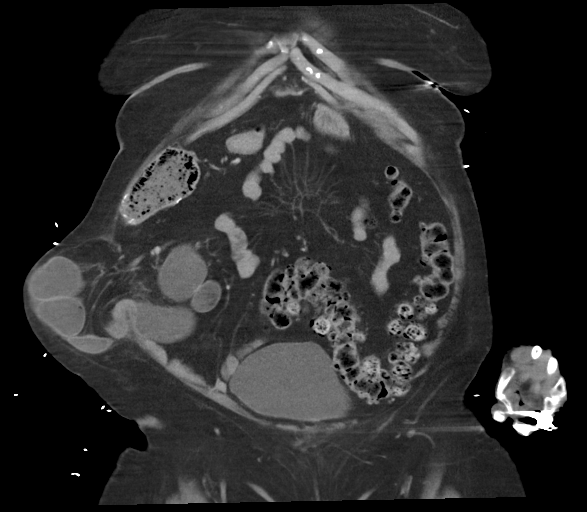
[im 28/62  soft-tissue]
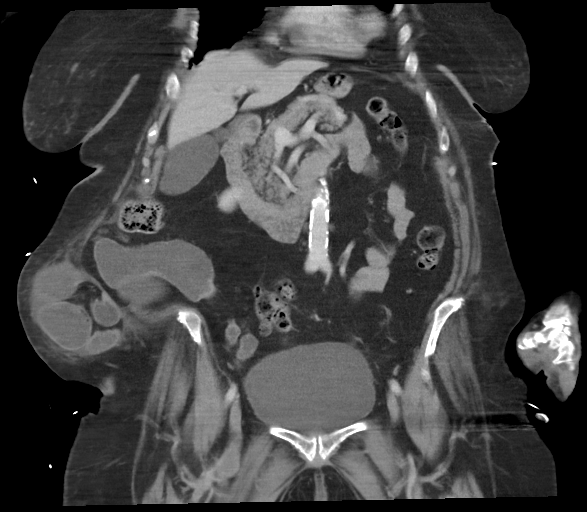
[im 34/62  soft-tissue]
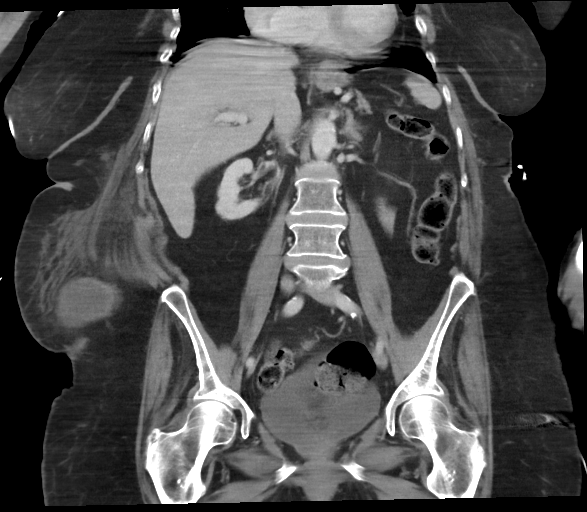

[15 of 46 positions shown; findings below may reference images not displayed]

FINDINGS: Lower chest: Moderate size sliding-type hiatal hernia is noted.
Visualized lung bases are unremarkable.

Hepatobiliary: Mild cholelithiasis is noted without inflammation. No
biliary dilatation is noted. Liver is unremarkable.

Pancreas: Unremarkable. No pancreatic ductal dilatation or
surrounding inflammatory changes.

Spleen: Normal in size without focal abnormality.

Adrenals/Urinary Tract: Adrenal glands and kidneys appear normal. No
hydronephrosis or renal obstruction is noted. No renal or ureteral
calculi are noted. Urinary bladder is mildly distended.

Stomach/Bowel: Large amount of stool seen in the descending colon
and rectum. Diverticulosis of the sigmoid colon is noted without
inflammation. Large right lateral hernia is noted in right lower
quadrant which contains several dilated loops of small bowel, with
dilatation of more proximal small bowel loops suggesting
obstruction.

Vascular/Lymphatic: Aortic atherosclerosis. No enlarged abdominal or
pelvic lymph nodes.

Reproductive: Status post hysterectomy. No adnexal masses.

Other: No ascites is noted.

Musculoskeletal: Old L1 compression fracture is noted. No acute
osseous abnormality is noted.
IMPRESSION: Large right lateral hernia is noted in right lower quadrant of the
abdomen which contains several loops of dilated small bowel, with
dilatation of more proximal small bowel loops consistent with
probable incarceration and obstruction.

Moderate size sliding-type hiatal hernia.

Sigmoid diverticulosis without inflammation.

Mild urinary bladder distention.

Cholelithiasis.

Aortic Atherosclerosis ([id number]).

## 2019-05-25 IMAGING — DX DG ABD PORTABLE 1V
2 series · 2 of 2 positions shown · non-contrast
Comparison: Abdominal plain film dated 12/10/2017.

CLINICAL DATA: Small-bowel obstruction. History of colon cancer in
diverticulosis.

EXAM:
PORTABLE ABDOMEN - 1 VIEW

[abdomen supine (1 of 2)]
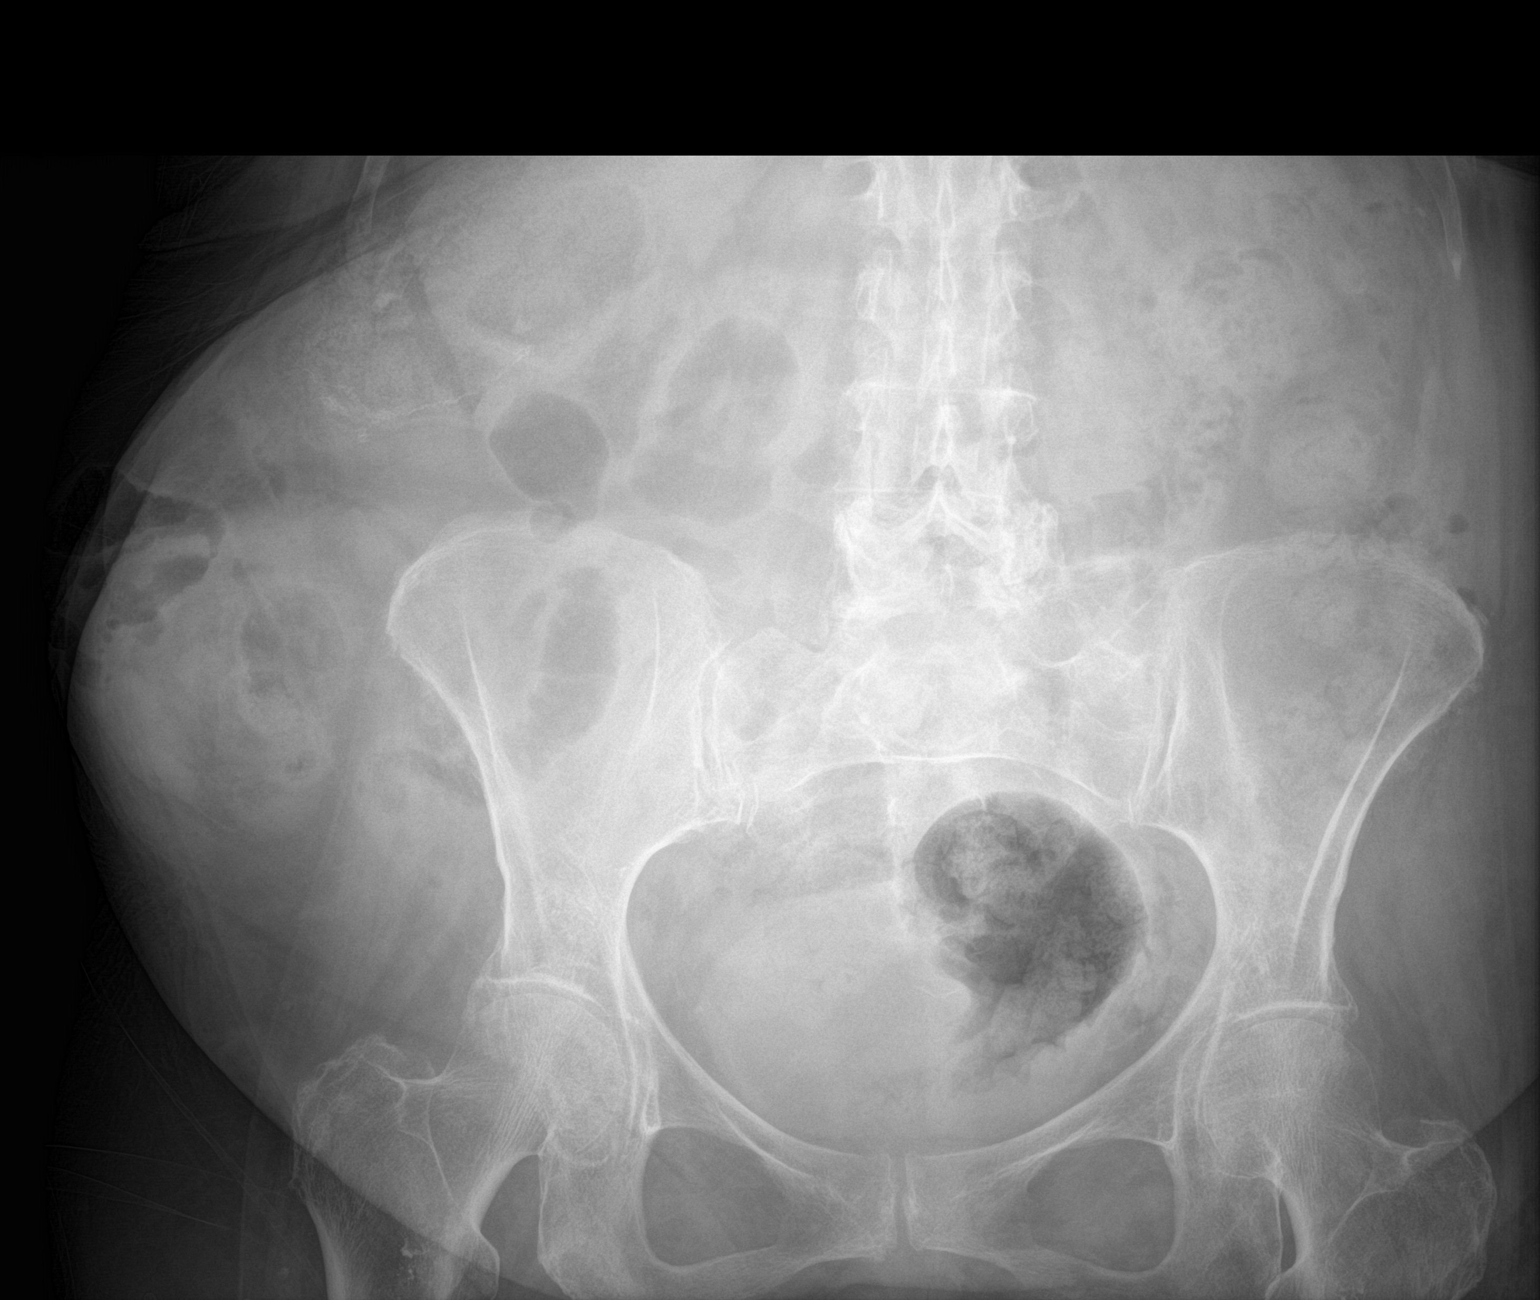

[abdomen supine (2 of 2)]
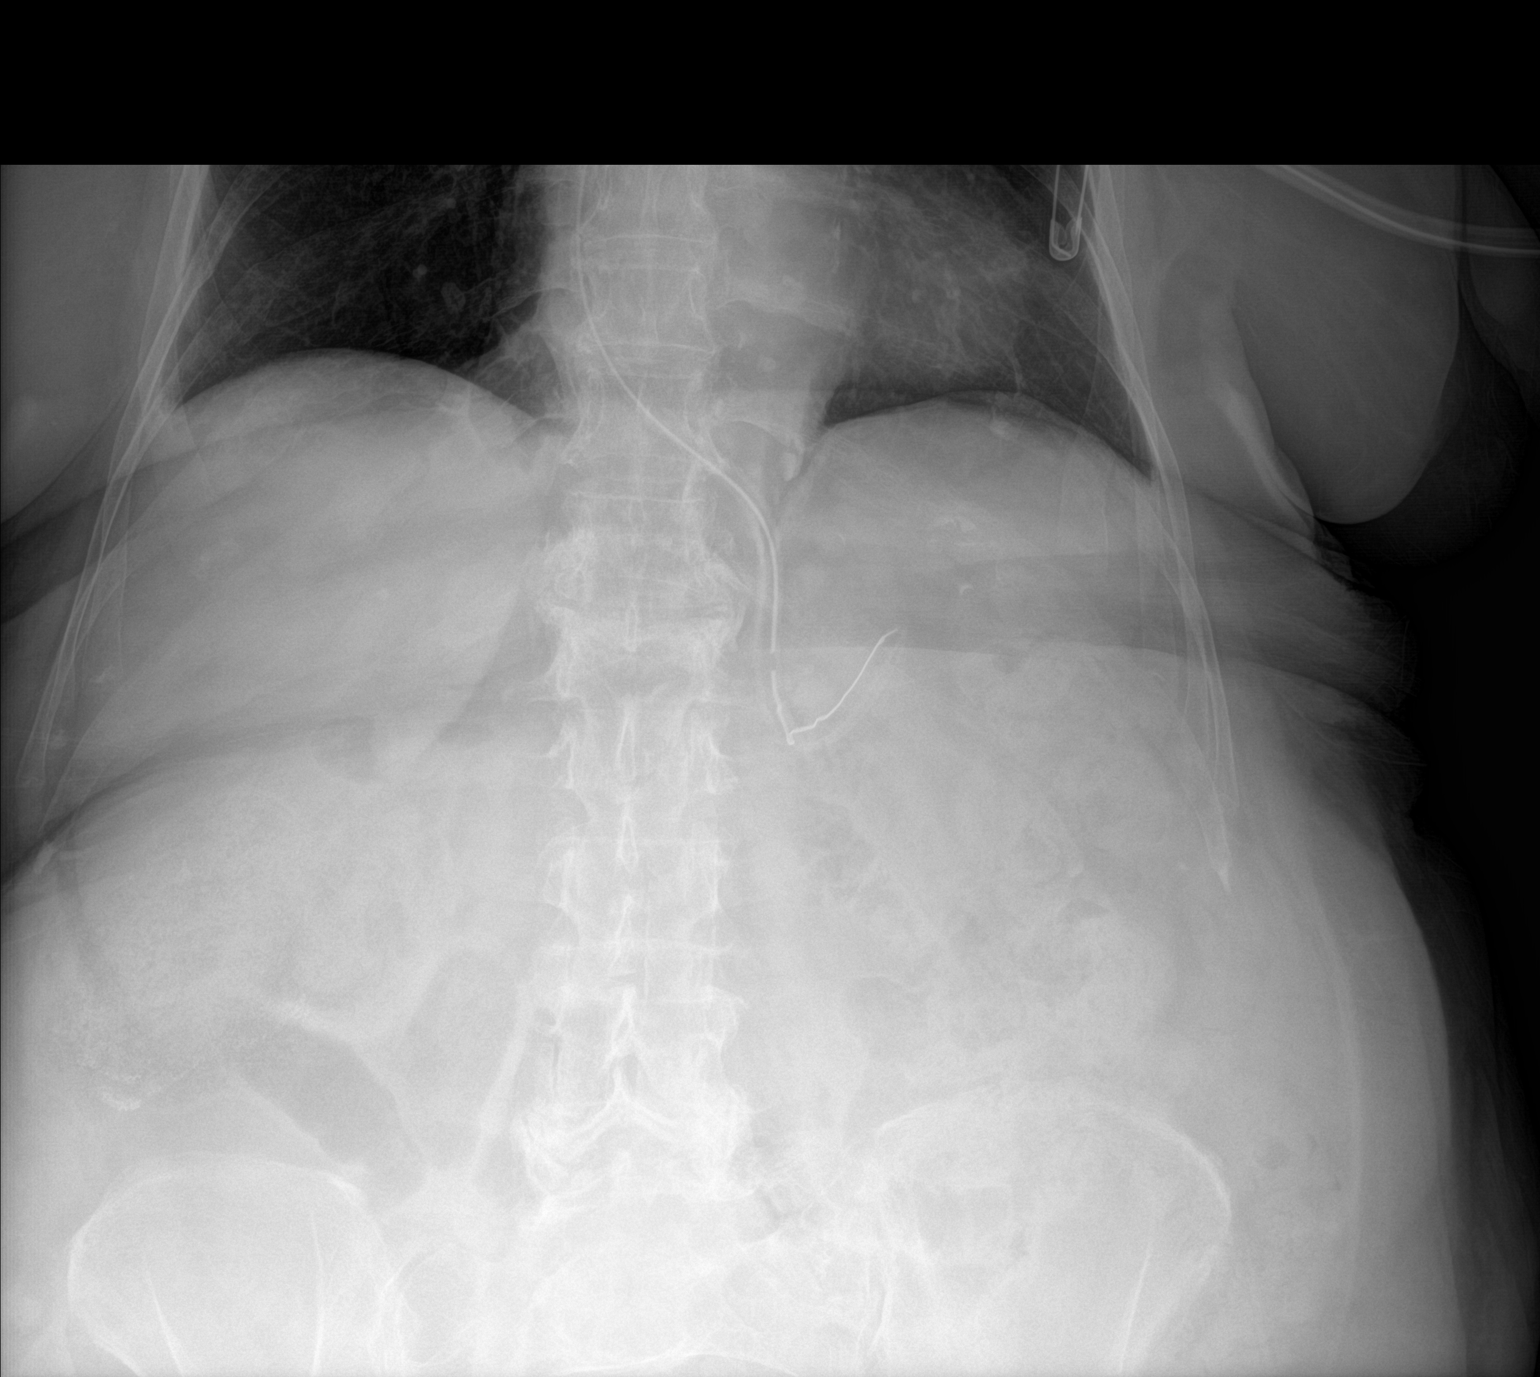

[2 of 2 positions shown; findings below may reference images not displayed]

FINDINGS: Enteric tube appears adequately positioned in the stomach. Bowel gas
pattern is nonobstructive. Moderate amount of stool in the colon. No
evidence of free intraperitoneal air. Lung bases appear clear. No
acute or suspicious osseous finding.
IMPRESSION: Nonobstructive bowel gas pattern. Enteric tube is adequately
positioned in the stomach.
